# Patient Record
Sex: Female | Born: 1955 | Race: White | Marital: Married | Smoking: Former smoker
Health system: Southern US, Community
[De-identification: ages and names within clinical notes are randomized; demographics above are authoritative.]

## PROBLEM LIST (undated history)

## (undated) DIAGNOSIS — E785 Hyperlipidemia, unspecified: Secondary | ICD-10-CM

## (undated) DIAGNOSIS — I10 Essential (primary) hypertension: Secondary | ICD-10-CM

## (undated) DIAGNOSIS — M109 Gout, unspecified: Secondary | ICD-10-CM

## (undated) DIAGNOSIS — E119 Type 2 diabetes mellitus without complications: Secondary | ICD-10-CM

## (undated) DIAGNOSIS — M8580 Other specified disorders of bone density and structure, unspecified site: Secondary | ICD-10-CM

## (undated) HISTORY — DX: Other specified disorders of bone density and structure, unspecified site: M85.80

## (undated) HISTORY — DX: Type 2 diabetes mellitus without complications: E11.9

## (undated) HISTORY — PX: APPENDECTOMY: SHX54

## (undated) HISTORY — PX: JOINT REPLACEMENT: SHX530

## (undated) HISTORY — PX: ABDOMINAL HYSTERECTOMY: SUR658

## (undated) HISTORY — DX: Hyperlipidemia, unspecified: E78.5

## (undated) HISTORY — PX: ABDOMINAL HYSTERECTOMY: SHX81

## (undated) HISTORY — PX: RECTAL PROLAPSE REPAIR: SHX759

## (undated) HISTORY — DX: Essential (primary) hypertension: I10

## (undated) HISTORY — PX: TONSILLECTOMY: SUR1361

## (undated) HISTORY — PX: BREAST CYST ASPIRATION: SHX578

## (undated) HISTORY — PX: VARICOSE VEIN SURGERY: SHX832

---

## 2003-07-06 LAB — HM COLONOSCOPY

## 2011-07-06 LAB — HM MAMMOGRAPHY: HM Mammogram: NORMAL

## 2013-12-23 HISTORY — DX: Hypercalcemia: E83.52

## 2014-09-24 LAB — LIPID PANEL
Cholesterol: 134 mg/dL (ref 0–200)
HDL: 45 mg/dL (ref 35–70)
LDL CALC: 19 mg/dL
Triglycerides: 348 mg/dL — AB (ref 40–160)

## 2014-09-24 LAB — CBC AND DIFFERENTIAL: Hemoglobin: 14.7 g/dL (ref 12.0–16.0)

## 2014-09-24 LAB — HEMOGLOBIN A1C: Hgb A1c MFr Bld: 6.4 % — AB (ref 4.0–6.0)

## 2014-12-03 ENCOUNTER — Other Ambulatory Visit: Payer: Self-pay | Admitting: Internal Medicine

## 2014-12-03 ENCOUNTER — Encounter: Payer: Self-pay | Admitting: Internal Medicine

## 2014-12-03 DIAGNOSIS — M5116 Intervertebral disc disorders with radiculopathy, lumbar region: Secondary | ICD-10-CM | POA: Insufficient documentation

## 2014-12-03 DIAGNOSIS — K5732 Diverticulitis of large intestine without perforation or abscess without bleeding: Secondary | ICD-10-CM | POA: Insufficient documentation

## 2014-12-03 DIAGNOSIS — N6019 Diffuse cystic mastopathy of unspecified breast: Secondary | ICD-10-CM | POA: Insufficient documentation

## 2014-12-03 DIAGNOSIS — I10 Essential (primary) hypertension: Secondary | ICD-10-CM | POA: Insufficient documentation

## 2014-12-03 DIAGNOSIS — K635 Polyp of colon: Secondary | ICD-10-CM | POA: Insufficient documentation

## 2014-12-03 DIAGNOSIS — E782 Mixed hyperlipidemia: Secondary | ICD-10-CM | POA: Insufficient documentation

## 2014-12-03 DIAGNOSIS — M810 Age-related osteoporosis without current pathological fracture: Secondary | ICD-10-CM | POA: Insufficient documentation

## 2014-12-03 DIAGNOSIS — E785 Hyperlipidemia, unspecified: Secondary | ICD-10-CM

## 2014-12-03 DIAGNOSIS — E1169 Type 2 diabetes mellitus with other specified complication: Secondary | ICD-10-CM | POA: Insufficient documentation

## 2014-12-04 ENCOUNTER — Encounter: Payer: Self-pay | Admitting: Internal Medicine

## 2014-12-04 ENCOUNTER — Other Ambulatory Visit: Payer: Self-pay | Admitting: Internal Medicine

## 2014-12-04 ENCOUNTER — Ambulatory Visit (INDEPENDENT_AMBULATORY_CARE_PROVIDER_SITE_OTHER): Payer: Medicare PPO | Admitting: Internal Medicine

## 2014-12-04 VITALS — BP 142/74 | HR 78 | Temp 98.2°F | Ht 66.0 in | Wt 187.8 lb

## 2014-12-04 DIAGNOSIS — J40 Bronchitis, not specified as acute or chronic: Secondary | ICD-10-CM | POA: Diagnosis not present

## 2014-12-04 DIAGNOSIS — H109 Unspecified conjunctivitis: Secondary | ICD-10-CM

## 2014-12-04 MED ORDER — AMOXICILLIN-POT CLAVULANATE 875-125 MG PO TABS: 1.0000 | ORAL_TABLET | Freq: Two times a day (BID) | ORAL | Status: DC

## 2014-12-04 MED ORDER — NEOMYCIN-POLYMYXIN-DEXAMETH 3.5-10000-0.1 OP SUSP: 2.0000 [drp] | Freq: Four times a day (QID) | OPHTHALMIC | Status: DC

## 2014-12-04 NOTE — Progress Notes (Signed)
Date:  12/04/2014   Name:  Kiara Campbell   DOB:  08-19-1955   MRN:  [id number]  PCP:  No primary care provider on file.    Chief Complaint: Cough; Sinusitis; and Sore Throat   History of Present Illness:  This is a 59 y.o. female who is presenting with sinus congestion, cough and eye irritation.  Sinus pain - onset about one week ago after a cold.  Sinus pressure bilaterally, thick mucus discharge, post nasal drainage and cough.  No wheezes or SOB.  No fever or chills.  Currently using otc flonase spray.  Eye discharge - started three days ago.  Eyes are itchy with thick white mucus in the corners.  No eye pain or change in vision.  No current treatment.  Review of Systems:  Review of Systems  Constitutional: Negative for fever.  HENT: Positive for postnasal drip, sinus pressure and sore throat. Negative for ear pain and nosebleeds.   Eyes: Positive for discharge and itching (thick mucoid discharge). Negative for pain.  Respiratory: Positive for cough (thick white phlegm). Negative for shortness of breath and wheezing.   Gastrointestinal: Negative for nausea and diarrhea.  Neurological: Positive for headaches (frontal headache and pressure). Negative for dizziness.    Patient Active Problem List   Diagnosis Date Noted  . 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 Colon polyp 12/03/2014  . Diverticulitis of colon 12/03/2014  . Essential (primary) hypertension 12/03/2014  . Herniated nucleus pulposus 12/03/2014  . Combined fat and carbohydrate induced hyperlipemia 12/03/2014  . OP (osteoporosis) 12/03/2014  . Compulsive tobacco user syndrome 12/03/2014  . Calcium blood increased 12/23/2013    Prior to Admission medications   Medication Sig Start Date End Date Taking? Authorizing Provider  Calcium Carb-Cholecalciferol (CALCIUM + D3) 600-200 MG-UNIT TABS Take 1 tablet by mouth daily.   Yes Historical Provider, MD  Asencion Islam ALLERGY RELIEF 50 MCG/ACT nasal spray Place 2 sprays into the nose  daily as needed. 11/07/14  Yes Historical Provider, MD  losartan-hydrochlorothiazide (HYZAAR) 50-12.5 MG per tablet Take 1 tablet by mouth daily. 09/24/14  Yes Historical Provider, MD  metoprolol succinate (TOPROL-XL) 50 MG 24 hr tablet Take 1.5 tablets by mouth daily. 09/24/14  Yes Historical Provider, MD  Naproxen Sodium 220 MG CAPS Take 1 capsule by mouth daily as needed.   Yes Historical Provider, MD  simvastatin (ZOCOR) 40 MG tablet Take 1 tablet by mouth at bedtime. 09/24/14  Yes Historical Provider, MD    No Known Allergies  Past Surgical History  Procedure Laterality Date  . Varicose vein surgery    . Tonsillectomy    . Abdominal hysterectomy      partial  . Appendectomy      History  Substance Use Topics  . Smoking status: Current Every Day Smoker -- 0.50 packs/day for 30 years    Types: Cigarettes  . Smokeless tobacco: Not on file  . Alcohol Use: 1.2 oz/week    2 Standard drinks or equivalent per week    Family History  Problem Relation Age of Onset  . Diabetes Maternal Grandmother   . CAD Father     died age 55    Medication list has been reviewed and updated.  Physical Examination:  Physical Exam  Constitutional: She is oriented to person, place, and time. She appears well-developed. No distress.  HENT:  Head: Normocephalic and atraumatic. Head is without right periorbital erythema and without left periorbital erythema.  Right Ear: External ear and ear canal normal.  Left  Ear: External ear and ear canal normal.  Nose: Right sinus exhibits maxillary sinus tenderness and frontal sinus tenderness. Left sinus exhibits maxillary sinus tenderness and frontal sinus tenderness.  Mouth/Throat: Uvula is midline. No oropharyngeal exudate or posterior oropharyngeal edema.  Eyes: Right eye exhibits discharge. Left eye exhibits discharge. No scleral icterus.  Neck: No thyromegaly present.  Cardiovascular: Normal rate, regular rhythm and normal heart sounds.    Pulmonary/Chest: Effort normal. No respiratory distress. She has no wheezes. She has no rales.  Lymphadenopathy:    She has no cervical adenopathy.  Neurological: She is alert and oriented to person, place, and time.  Skin: Skin is warm and dry. No rash noted.  Psychiatric: She has a normal mood and affect. Her behavior is normal. Thought content normal.    BP 164/80 mmHg  Pulse 78  Temp(Src) 98.2 F (36.8 C)  Wt 187 lb 12.8 oz (85.186 kg)  SpO2 97%  LMP 09/27/1995  Assessment and Plan:  1. Bronchitis Continue fluids, otc cough syrup if needed - amoxicillin-clavulanate (AUGMENTIN) 875-125 MG per tablet; Take 1 tablet by mouth 2 (two) times daily.  Dispense: 20 tablet; Refill: 0  2. Bilateral conjunctivitis Avoid contact with others until resolved. - neomycin-polymyxin b-dexamethasone (MAXITROL) 3.5-10000-0.1 SUSP; Place 2 drops into both eyes every 6 (six) hours.  Dispense: 1 Bottle; Refill: 0   Halina Maidens, MD [facility]  12/04/2014

## 2014-12-04 NOTE — Patient Instructions (Signed)

## 2015-01-02 ENCOUNTER — Other Ambulatory Visit: Payer: Self-pay | Admitting: Internal Medicine

## 2015-01-02 ENCOUNTER — Telehealth: Payer: Self-pay

## 2015-01-02 DIAGNOSIS — J40 Bronchitis, not specified as acute or chronic: Secondary | ICD-10-CM

## 2015-01-02 MED ORDER — AMOXICILLIN-POT CLAVULANATE 875-125 MG PO TABS: 1.0000 | ORAL_TABLET | Freq: Two times a day (BID) | ORAL | Status: DC

## 2015-01-02 NOTE — Telephone Encounter (Signed)
Patient has sinus infection again, she wants you to call in another antibiotic.dr

## 2015-03-10 ENCOUNTER — Encounter: Payer: Self-pay | Admitting: Internal Medicine

## 2015-03-10 ENCOUNTER — Ambulatory Visit (INDEPENDENT_AMBULATORY_CARE_PROVIDER_SITE_OTHER): Payer: Medicare PPO | Admitting: Internal Medicine

## 2015-03-10 VITALS — BP 154/70 | HR 80 | Ht 65.0 in | Wt 186.4 lb

## 2015-03-10 DIAGNOSIS — R7309 Other abnormal glucose: Secondary | ICD-10-CM | POA: Diagnosis not present

## 2015-03-10 DIAGNOSIS — E119 Type 2 diabetes mellitus without complications: Secondary | ICD-10-CM | POA: Insufficient documentation

## 2015-03-10 DIAGNOSIS — M722 Plantar fascial fibromatosis: Secondary | ICD-10-CM

## 2015-03-10 DIAGNOSIS — R7303 Prediabetes: Secondary | ICD-10-CM

## 2015-03-10 DIAGNOSIS — Z23 Encounter for immunization: Secondary | ICD-10-CM | POA: Diagnosis not present

## 2015-03-10 DIAGNOSIS — IMO0002 Reserved for concepts with insufficient information to code with codable children: Secondary | ICD-10-CM

## 2015-03-10 DIAGNOSIS — M519 Unspecified thoracic, thoracolumbar and lumbosacral intervertebral disc disorder: Secondary | ICD-10-CM

## 2015-03-10 MED ORDER — GABAPENTIN 100 MG PO CAPS: 100.0000 mg | ORAL_CAPSULE | Freq: Every day | ORAL | Status: DC

## 2015-03-10 NOTE — Progress Notes (Signed)
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 The pain is present in the lumbar spine. The quality of the pain is described as aching and burning. The pain radiates to the right thigh. The pain is moderate (She's had several epidural steroid injections without benefit. Is also up into the pain clinic at [facility] without much benefit. She is very hesitant to consider surgery because they cannot guarantee improvement in her back pain.). Associated symptoms include weakness. Pertinent negatives include no chest pain, headaches, numbness or pelvic pain.  Foot Pain This is a new (She initially had pain in the right heel with only groundglass 5 weeks ago. She took Naprosyn and used ice and elevate it and put in a shoe insert. Symptoms much are much improved at this point but now she has some tenderness and swelling on the lateral as) problem. Associated symptoms include weakness. Pertinent negatives include no chest pain, chills, coughing, diaphoresis, fatigue, headaches, joint swelling, myalgias or numbness.  Diabetes She presents for her follow-up (Patient noted be prediabetic 5 months ago with an A1c of 6.4.) diabetic visit. Disease course: She has been working on decreasing her carbon sweet intake. Her weights been stable. She is not obtained a glucometer. Pertinent negatives for hypoglycemia include no headaches. Associated symptoms include weakness. Pertinent negatives for diabetes include no chest pain and no fatigue.     Review of Systems:  Review of Systems  Constitutional: Negative for chills, diaphoresis and fatigue.  Respiratory: Negative for cough, choking and shortness of breath.   Cardiovascular: Negative for chest pain and leg swelling.  Gastrointestinal: Negative.   Genitourinary: Negative.   Negative for urgency, difficulty urinating and pelvic pain.  Musculoskeletal: Positive for back pain and gait problem. Negative for myalgias and joint swelling.  Neurological: Positive for weakness. Negative for numbness and headaches.    Patient Active Problem List   Diagnosis Date Noted  . Prediabetes 03/10/2015  . 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 Colon polyp 12/03/2014  . Diverticulitis of colon 12/03/2014  . Essential (primary) hypertension 12/03/2014  . Herniated nucleus pulposus 12/03/2014  . Combined fat and carbohydrate induced hyperlipemia 12/03/2014  . OP (osteoporosis) 12/03/2014  . Compulsive tobacco user syndrome 12/03/2014  . Calcium blood increased 12/23/2013    Prior to Admission medications   Medication Sig Start Date End Date Taking? Authorizing Provider  Calcium Carb-Cholecalciferol (CALCIUM + D3) 600-200 MG-UNIT TABS Take 1 tablet by mouth daily.   Yes Historical Provider, MD  fluticasone Asencion Islam) 50 MCG/ACT nasal spray  02/12/15  Yes Historical Provider, MD  losartan-hydrochlorothiazide (HYZAAR) 50-12.5 MG per tablet Take 1 tablet by mouth daily. 09/24/14  Yes Historical Provider, MD  metoprolol succinate (TOPROL-XL) 50 MG 24 hr tablet Take 1.5 tablets by mouth daily. 09/24/14  Yes Historical Provider, MD  Naproxen Sodium 220 MG CAPS Take 1 capsule by mouth daily as needed.   Yes Historical Provider, MD  simvastatin (ZOCOR) 40 MG tablet Take 1 tablet by mouth at bedtime. 09/24/14  Yes Historical Provider, MD  amoxicillin-clavulanate (AUGMENTIN) 875-125 MG per tablet Take 1 tablet by mouth 2 (two) times daily. Patient not taking: Reported on 03/10/2015 01/02/15   Glean Hess, MD  neomycin-polymyxin b-dexamethasone (MAXITROL) 3.5-10000-0.1 SUSP Place 2 drops into both eyes every 6 (six) hours.  Patient not taking: Reported on 03/10/2015 12/04/14   Glean Hess, MD    No Known Allergies  Past Surgical History  Procedure Laterality Date  . Varicose vein surgery    .  Tonsillectomy    . Abdominal hysterectomy      partial  . Appendectomy      Social History  Substance Use Topics  . Smoking status: Current Every Day Smoker -- 0.50 packs/day for 30 years    Types: Cigarettes  . Smokeless tobacco: None  . Alcohol Use: 1.2 oz/week    2 Standard drinks or equivalent per week     Medication list has been reviewed and updated.  Physical Examination:  Physical Exam  Constitutional: She is oriented to person, place, and time. She appears well-developed. She appears distressed.  HENT:  Head: Normocephalic and atraumatic.  Eyes: Conjunctivae are normal. Right eye exhibits no discharge. Left eye exhibits no discharge. No scleral icterus.  Cardiovascular: Normal rate, regular rhythm and normal heart sounds.   Pulses:      Dorsalis pedis pulses are 1+ on the right side, and 1+ on the left side.       Posterior tibial pulses are 1+ on the right side, and 1+ on the left side.  Pulmonary/Chest: Effort normal and breath sounds normal. No respiratory distress. She has no wheezes.  Musculoskeletal: Normal range of motion.       Feet:  Neurological: She is alert and oriented to person, place, and time.  Reflex Scores:      Patellar reflexes are 1+ on the right side and 1+ on the left side. Skin: Skin is warm and dry. No rash noted.  Psychiatric: She has a normal mood and affect. Her behavior is normal. Thought content normal.    BP 154/70 mmHg  Pulse 80  Ht 5\' 5"  (1.651 m)  Wt 186 lb 6.4 oz (84.55 kg)  BMI 31.02 kg/m2  LMP 09/27/1995  Assessment and Plan: 1. Prediabetes Continue dietary modifications - Hemoglobin A1c  2. Herniated nucleus pulposus Will consider surgical consult. Patient is encouraged to do this especially in view of persistent lower extremity symptoms weakness and occasional falls. We'll add Neurontin at bedtime and titrate up as tolerated. Continue naproxen. - gabapentin (NEURONTIN) 100 MG capsule; Take 1-3 capsules (100-300 mg  total) by mouth at bedtime.  Dispense: 270 capsule; Refill: 1  3. Need for influenza vaccination - Flu Vaccine QUAD 36+ mos PF IM (Fluarix & Fluzone Quad PF)  4. Plantar fasciitis of right foot Continue to use naproxen as needed. Continue ice when necessary.   Halina Maidens, MD [facility]  03/10/2015

## 2015-03-11 LAB — HEMOGLOBIN A1C
ESTIMATED AVERAGE GLUCOSE: 140 mg/dL
Hgb A1c MFr Bld: 6.5 % — ABNORMAL HIGH (ref 4.8–5.6)

## 2015-03-12 ENCOUNTER — Other Ambulatory Visit: Payer: Self-pay | Admitting: Internal Medicine

## 2015-03-12 MED ORDER — ACCU-CHEK SOFTCLIX LANCET DEV MISC: 1.0000 | Freq: Every day | Status: DC

## 2015-03-12 MED ORDER — METFORMIN HCL ER (MOD) 500 MG PO TB24: 500.0000 mg | ORAL_TABLET | Freq: Every day | ORAL | Status: DC

## 2015-03-15 ENCOUNTER — Other Ambulatory Visit: Payer: Self-pay | Admitting: Internal Medicine

## 2015-03-17 ENCOUNTER — Other Ambulatory Visit: Payer: Self-pay | Admitting: Internal Medicine

## 2015-03-17 MED ORDER — ACCU-CHEK SOFTCLIX LANCET DEV MISC: Status: DC

## 2015-04-01 ENCOUNTER — Other Ambulatory Visit: Payer: Self-pay | Admitting: Internal Medicine

## 2015-04-01 MED ORDER — METFORMIN HCL ER (MOD) 500 MG PO TB24: 500.0000 mg | ORAL_TABLET | Freq: Every day | ORAL | Status: DC

## 2015-04-03 ENCOUNTER — Other Ambulatory Visit: Payer: Self-pay | Admitting: Internal Medicine

## 2015-04-03 MED ORDER — METFORMIN HCL ER (MOD) 500 MG PO TB24: 500.0000 mg | ORAL_TABLET | Freq: Every day | ORAL | Status: DC

## 2015-04-07 ENCOUNTER — Other Ambulatory Visit: Payer: Self-pay | Admitting: Internal Medicine

## 2015-04-07 MED ORDER — METFORMIN HCL ER (MOD) 500 MG PO TB24: 500.0000 mg | ORAL_TABLET | Freq: Every day | ORAL | Status: DC

## 2015-04-13 ENCOUNTER — Other Ambulatory Visit: Payer: Self-pay | Admitting: Internal Medicine

## 2015-04-13 MED ORDER — METFORMIN HCL ER (MOD) 500 MG PO TB24: 500.0000 mg | ORAL_TABLET | Freq: Every day | ORAL | Status: DC

## 2015-06-02 ENCOUNTER — Encounter: Payer: Self-pay | Admitting: Internal Medicine

## 2015-06-02 ENCOUNTER — Ambulatory Visit (INDEPENDENT_AMBULATORY_CARE_PROVIDER_SITE_OTHER): Payer: Medicare PPO | Admitting: Internal Medicine

## 2015-06-02 ENCOUNTER — Other Ambulatory Visit: Payer: Self-pay | Admitting: Internal Medicine

## 2015-06-02 VITALS — BP 140/66 | HR 84 | Ht 65.0 in | Wt 185.2 lb

## 2015-06-02 DIAGNOSIS — E119 Type 2 diabetes mellitus without complications: Secondary | ICD-10-CM

## 2015-06-02 DIAGNOSIS — L0100 Impetigo, unspecified: Secondary | ICD-10-CM | POA: Diagnosis not present

## 2015-06-02 DIAGNOSIS — E118 Type 2 diabetes mellitus with unspecified complications: Secondary | ICD-10-CM | POA: Insufficient documentation

## 2015-06-02 MED ORDER — MUPIROCIN 2 % EX OINT: 1.0000 "application " | TOPICAL_OINTMENT | Freq: Two times a day (BID) | CUTANEOUS | Status: DC

## 2015-06-02 MED ORDER — GLIMEPIRIDE 2 MG PO TABS: 2.0000 mg | ORAL_TABLET | Freq: Every day | ORAL | Status: DC

## 2015-06-02 MED ORDER — DICLOXACILLIN SODIUM 500 MG PO CAPS: 500.0000 mg | ORAL_CAPSULE | Freq: Four times a day (QID) | ORAL | Status: DC

## 2015-06-02 NOTE — Progress Notes (Signed)
Date:  06/02/2015   Name:  Kiara Campbell   DOB:  Aug 08, 1955   MRN:  [id number]   Chief Complaint: Rash  Several months ago patient began taking metformin for elevated blood sugars. After about a week she developed a rash and swelling around her lips. She stopped the medication and it went away pretty quickly. She waited several weeks and then begin the metformin again after 2 weeks she began to have similar symptoms with redness swelling and pain around her lips and chin. She continued the metformin until about one week ago when the symptoms became so severe that she stopped it. She's been applying Vaseline to the rash. She describes it as painful not pruritic. It's been weepy with a yellow crust especially prominent in the morning. She denies any swelling of her mouth or tongue, no swelling of her eyes, no fever, and no shortness of breath.   Review of Systems  Constitutional: Negative for fever, chills and fatigue.  HENT: Negative for dental problem, ear discharge, mouth sores, sore throat and trouble swallowing.   Respiratory: Negative for cough, shortness of breath and wheezing.   Cardiovascular: Negative for chest pain, palpitations and leg swelling.  Skin: Positive for color change and rash.    Patient Active Problem List   Diagnosis Date Noted  . Controlled diabetes mellitus type II without complication ([place]) 99991111  . 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 Colon polyp 12/03/2014  . Diverticulitis of colon 12/03/2014  . Essential (primary) hypertension 12/03/2014  . Herniated nucleus pulposus 12/03/2014  . Combined fat and carbohydrate induced hyperlipemia 12/03/2014  . OP (osteoporosis) 12/03/2014  . Compulsive tobacco user syndrome 12/03/2014  . Calcium blood increased 12/23/2013    Prior to Admission medications   Medication Sig Start Date End Date Taking? Authorizing Provider  ACCU-CHEK SOFTCLIX LANCETS lancets  05/12/15  Yes Historical Provider, MD  Calcium  Carb-Cholecalciferol (CALCIUM + D3) 600-200 MG-UNIT TABS Take 1 tablet by mouth daily.   Yes Historical Provider, MD  fluticasone Asencion Islam) 50 MCG/ACT nasal spray  02/12/15  Yes Historical Provider, MD  gabapentin (NEURONTIN) 100 MG capsule Take 1-3 capsules (100-300 mg total) by mouth at bedtime. 03/10/15  Yes Glean Hess, MD  Lancet Devices [facility]) lancets 1 each by Other route daily. Use as instructed 03/12/15  Yes Glean Hess, MD  Lancet Devices [facility]) lancets Use daily to check blood sugar 03/17/15  Yes Glean Hess, MD  losartan-hydrochlorothiazide [facility]) 50-12.5 MG per tablet Take 1 tablet by mouth daily. 09/24/14  Yes Historical Provider, MD  metoprolol succinate (TOPROL-XL) 100 MG 24 hr tablet TAKE 1 AND 1/2 TABLET DAILY 03/15/15  Yes Glean Hess, MD  metoprolol succinate (TOPROL-XL) 50 MG 24 hr tablet Take 1.5 tablets by mouth daily. 09/24/14  Yes Historical Provider, MD  Naproxen Sodium 220 MG CAPS Take 1 capsule by mouth daily as needed.   Yes Historical Provider, MD  simvastatin (ZOCOR) 40 MG tablet Take 1 tablet by mouth at bedtime. 09/24/14  Yes Historical Provider, MD  metFORMIN (GLUMETZA) 500 MG (MOD) 24 hr tablet Take 1 tablet (500 mg total) by mouth daily. Patient not taking: Reported on 06/02/2015 04/13/15   Glean Hess, MD  neomycin-polymyxin b-dexamethasone (MAXITROL) 3.5-10000-0.1 SUSP Place 2 drops into both eyes every 6 (six) hours. Patient not taking: Reported on 03/10/2015 12/04/14   Glean Hess, MD    No Known Allergies  Past Surgical History  Procedure Laterality Date  . Varicose vein  surgery    . Tonsillectomy    . Abdominal hysterectomy      partial  . Appendectomy      Social History  Substance Use Topics  . Smoking status: Current Every Day Smoker -- 0.50 packs/day for 30 years    Types: Cigarettes  . Smokeless tobacco: None  . Alcohol Use: 1.2 oz/week    2 Standard drinks or equivalent per week     Medication list has been reviewed and updated.   Physical Exam  Constitutional: She appears well-developed and well-nourished.  Neck: Normal range of motion. Neck supple.  Cardiovascular: Normal rate, regular rhythm and normal heart sounds.   Pulmonary/Chest: Effort normal and breath sounds normal.  Musculoskeletal: She exhibits no edema.  Lymphadenopathy:    She has no cervical adenopathy.  Skin: Rash noted. There is erythema (with weeping and crust formation around the entire mouth and chin).  Psychiatric: She has a normal mood and affect. Her behavior is normal. Thought content normal.    BP 140/66 mmHg  Pulse 84  Ht 5\' 5"  (1.651 m)  Wt 185 lb 3.2 oz (84.006 kg)  BMI 30.82 kg/m2  LMP 09/27/1995  Assessment and Plan: 1. Impetigo - dicloxacillin (DYNAPEN) 500 MG capsule; Take 1 capsule (500 mg total) by mouth 4 (four) times daily.  Dispense: 40 capsule; Refill: 0 - mupirocin ointment (BACTROBAN) 2 %; Place 1 application into the nose 2 (two) times daily.  Dispense: 22 g; Refill: 0  2. Controlled type 2 diabetes mellitus without complication, without long-term current use of insulin (HCC) Remain off metformin Prescribed low-dose Amaryl - glimepiride (AMARYL) 2 MG tablet; Take 1 tablet (2 mg total) by mouth daily before breakfast.  Dispense: 30 tablet; Refill: 0   Halina Maidens, MD [facility]  06/02/2015

## 2015-07-24 ENCOUNTER — Encounter: Payer: Self-pay | Admitting: Internal Medicine

## 2015-07-24 ENCOUNTER — Ambulatory Visit (INDEPENDENT_AMBULATORY_CARE_PROVIDER_SITE_OTHER): Payer: Medicare PPO | Admitting: Internal Medicine

## 2015-07-24 VITALS — BP 140/80 | HR 68 | Temp 98.3°F | Resp 16 | Ht 66.0 in | Wt 186.0 lb

## 2015-07-24 DIAGNOSIS — E119 Type 2 diabetes mellitus without complications: Secondary | ICD-10-CM

## 2015-07-24 DIAGNOSIS — L0100 Impetigo, unspecified: Secondary | ICD-10-CM

## 2015-07-24 DIAGNOSIS — J0140 Acute pansinusitis, unspecified: Secondary | ICD-10-CM | POA: Diagnosis not present

## 2015-07-24 MED ORDER — CEFDINIR 300 MG PO CAPS: 300.0000 mg | ORAL_CAPSULE | Freq: Two times a day (BID) | ORAL | Status: DC

## 2015-07-24 MED ORDER — ALBUTEROL SULFATE HFA 108 (90 BASE) MCG/ACT IN AERS: 2.0000 | INHALATION_SPRAY | Freq: Four times a day (QID) | RESPIRATORY_TRACT | Status: DC | PRN

## 2015-07-24 NOTE — Progress Notes (Signed)
Date:  07/24/2015   Name:  Kiara Campbell   DOB:  [birth year]   MRN:  [id number]   Chief Complaint: Sinusitis Sinusitis This is a new problem. The current episode started in the past 7 days. There has been no fever. Associated symptoms include congestion, coughing, headaches, sinus pressure and a sore throat. Pertinent negatives include no chills, diaphoresis, ear pain or shortness of breath. Past treatments include antibiotics and spray decongestants. The treatment provided mild relief.  Rash This is a recurrent problem. The affected locations include the lips. The rash is characterized by blistering, dryness, redness and peeling. Associated with: stopped metformin due to possible cause. Associated symptoms include congestion, coughing, fatigue and a sore throat. Pertinent negatives include no diarrhea, fever, shortness of breath or vomiting. Past treatments include antibiotic cream and antibiotics (responded well to docloxacillin and mupirocin). The treatment provided significant (but it recurs) relief.      Review of Systems  Constitutional: Positive for fatigue. Negative for fever, chills and diaphoresis.  HENT: Positive for congestion, sinus pressure and sore throat. Negative for ear pain.   Respiratory: Positive for cough and wheezing. Negative for chest tightness and shortness of breath.   Cardiovascular: Negative for chest pain and palpitations.  Gastrointestinal: Negative for vomiting and diarrhea.  Skin: Positive for rash.  Neurological: Positive for headaches. Negative for dizziness, syncope and numbness.    Patient Active Problem List   Diagnosis Date Noted  . Controlled type 2 diabetes mellitus without complication, without long-term current use of insulin ([place]) 06/02/2015  . 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 Colon polyp 12/03/2014  . Diverticulitis of colon 12/03/2014  . Essential (primary) hypertension 12/03/2014  . Herniated nucleus pulposus 12/03/2014  . Combined fat  and carbohydrate induced hyperlipemia 12/03/2014  . OP (osteoporosis) 12/03/2014  . Compulsive tobacco user syndrome 12/03/2014  . Calcium blood increased 12/23/2013    Prior to Admission medications   Medication Sig Start Date End Date Taking? Authorizing Provider  ACCU-CHEK SOFTCLIX LANCETS lancets  05/12/15  Yes Historical Provider, MD  Calcium Carb-Cholecalciferol (CALCIUM + D3) 600-200 MG-UNIT TABS Take 1 tablet by mouth daily.   Yes Historical Provider, MD  fluticasone Asencion Islam) 50 MCG/ACT nasal spray  02/12/15  Yes Historical Provider, MD  gabapentin (NEURONTIN) 100 MG capsule Take 1-3 capsules (100-300 mg total) by mouth at bedtime. 03/10/15  Yes Glean Hess, MD  glimepiride (AMARYL) 2 MG tablet Take 1 tablet (2 mg total) by mouth daily before breakfast. 06/02/15  Yes Glean Hess, MD  Lancet Devices [facility]) lancets 1 each by Other route daily. Use as instructed 03/12/15  Yes Glean Hess, MD  Lancet Devices [facility]) lancets Use daily to check blood sugar 03/17/15  Yes Glean Hess, MD  losartan-hydrochlorothiazide [facility]) 50-12.5 MG per tablet Take 1 tablet by mouth daily. 09/24/14  Yes Historical Provider, MD  metoprolol succinate (TOPROL-XL) 100 MG 24 hr tablet TAKE 1 AND 1/2 TABLET DAILY 03/15/15  Yes Glean Hess, MD  metoprolol succinate (TOPROL-XL) 50 MG 24 hr tablet Take 1.5 tablets by mouth daily. 09/24/14  Yes Historical Provider, MD  mupirocin ointment (BACTROBAN) 2 % Place 1 application into the nose 2 (two) times daily. 06/02/15  Yes Glean Hess, MD  Naproxen Sodium 220 MG CAPS Take 1 capsule by mouth daily as needed.   Yes Historical Provider, MD  simvastatin (ZOCOR) 40 MG tablet Take 1 tablet by mouth at bedtime. 09/24/14  Yes Historical Provider, MD  metFORMIN (GLUMETZA) 500 MG (MOD) 24 hr tablet Take 1 tablet (500 mg total) by mouth daily. Patient not taking: Reported on 06/02/2015 04/13/15   Glean Hess, MD    neomycin-polymyxin b-dexamethasone (MAXITROL) 3.5-10000-0.1 SUSP Place 2 drops into both eyes every 6 (six) hours. Patient not taking: Reported on 03/10/2015 12/04/14   Glean Hess, MD    No Known Allergies  Past Surgical History  Procedure Laterality Date  . Varicose vein surgery    . Tonsillectomy    . Abdominal hysterectomy      partial  . Appendectomy      Social History  Substance Use Topics  . Smoking status: Current Every Day Smoker -- 0.50 packs/day for 30 years    Types: Cigarettes  . Smokeless tobacco: None  . Alcohol Use: 1.2 oz/week    2 Standard drinks or equivalent per week     Medication list has been reviewed and updated.   Physical Exam  Constitutional: She is oriented to person, place, and time. She appears well-developed and well-nourished.  HENT:  Right Ear: External ear and ear canal normal. Tympanic membrane is not erythematous and not retracted.  Left Ear: External ear and ear canal normal. Tympanic membrane is not erythematous and not retracted.  Nose: Right sinus exhibits maxillary sinus tenderness and frontal sinus tenderness. Left sinus exhibits maxillary sinus tenderness and frontal sinus tenderness.  Mouth/Throat: Uvula is midline and mucous membranes are normal. No oral lesions. Posterior oropharyngeal erythema present. No oropharyngeal exudate.  Cardiovascular: Normal rate, regular rhythm and normal heart sounds.   Pulmonary/Chest: Effort normal and breath sounds normal. She has no wheezes. She has no rales.  Lymphadenopathy:    She has no cervical adenopathy.  Neurological: She is alert and oriented to person, place, and time.  Skin:  Skin around upper lip - slightly red and peeling  Nursing note and vitals reviewed.   BP 140/80 mmHg  Pulse 68  Temp(Src) 98.3 F (36.8 C)  Resp 16  Ht 5\' 6"  (1.676 m)  Wt 186 lb (84.369 kg)  BMI 30.04 kg/m2  LMP 09/27/1995  Assessment and Plan: 1. Acute pansinusitis, recurrence not specified -  cefdinir (OMNICEF) 300 MG capsule; Take 1 capsule (300 mg total) by mouth 2 (two) times daily.  Dispense: 20 capsule; Refill: 0 - albuterol (PROVENTIL HFA;VENTOLIN HFA) 108 (90 Base) MCG/ACT inhaler; Inhale 2 puffs into the lungs every 6 (six) hours as needed for wheezing or shortness of breath.  Dispense: 1 Inhaler; Refill: 0  2. Impetigo Continue mupirocin as needed Consider Dermatology consult  3. Controlled type 2 diabetes mellitus without complication, without long-term current use of insulin ([place]) Now on glimepiride Check labs at next visit in March   Halina Maidens, MD [facility]  07/24/2015

## 2015-07-29 ENCOUNTER — Other Ambulatory Visit: Payer: Self-pay | Admitting: Internal Medicine

## 2015-07-29 MED ORDER — TRUE METRIX AIR GLUCOSE METER DEVI: 1.0000 | Freq: Every day | Status: DC

## 2015-07-29 MED ORDER — GLUCOSE BLOOD VI STRP: 1.0000 | ORAL_STRIP | Freq: Every day | Status: DC

## 2015-07-29 MED ORDER — BD SWAB SINGLE USE REGULAR PADS: 1.0000 | MEDICATED_PAD | Freq: Every day | Status: DC

## 2015-07-29 MED ORDER — TRUEPLUS LANCETS 33G MISC: 1.0000 | Freq: Every day | Status: DC

## 2015-09-16 ENCOUNTER — Ambulatory Visit (INDEPENDENT_AMBULATORY_CARE_PROVIDER_SITE_OTHER): Payer: Medicare PPO | Admitting: Family Medicine

## 2015-09-16 ENCOUNTER — Encounter: Payer: Self-pay | Admitting: Family Medicine

## 2015-09-16 VITALS — BP 140/86 | HR 72 | Temp 98.2°F | Resp 12 | Ht 66.0 in | Wt 185.6 lb

## 2015-09-16 DIAGNOSIS — N3000 Acute cystitis without hematuria: Secondary | ICD-10-CM | POA: Diagnosis not present

## 2015-09-16 DIAGNOSIS — R110 Nausea: Secondary | ICD-10-CM

## 2015-09-16 LAB — POCT URINALYSIS DIPSTICK
Bilirubin, UA: NEGATIVE
Glucose, UA: NEGATIVE
Ketones, UA: NEGATIVE
NITRITE UA: NEGATIVE
PH UA: 6
PROTEIN UA: 30
Spec Grav, UA: 1.02
UROBILINOGEN UA: 0.2

## 2015-09-16 MED ORDER — SULFAMETHOXAZOLE-TRIMETHOPRIM 800-160 MG PO TABS: 1.0000 | ORAL_TABLET | Freq: Two times a day (BID) | ORAL | Status: DC

## 2015-09-16 MED ORDER — PROMETHAZINE HCL 25 MG PO TABS: 25.0000 mg | ORAL_TABLET | Freq: Three times a day (TID) | ORAL | Status: DC | PRN

## 2015-09-16 NOTE — Progress Notes (Signed)
Name: Kiara Campbell   MRN: [id number]    DOB: 1956/03/28   Date:09/16/2015       Progress Note  Subjective  Chief Complaint  Chief Complaint  Patient presents with  . Urinary Tract Infection    Patient complains of burning with urination, frequency, nausea. Patient states that symptoms started on Saturday and have been worsening.     Urinary Tract Infection  This is a new problem. The current episode started in the past 7 days. The problem occurs intermittently. The problem has been gradually worsening. The quality of the pain is described as burning. There has been no fever. Associated symptoms include chills, frequency, nausea and urgency. Pertinent negatives include no discharge, flank pain, hematuria or vomiting. She has tried increased fluids for the symptoms. The treatment provided no relief. There is no history of recurrent UTIs.    No problem-specific assessment & plan notes found for this encounter.   History reviewed. No pertinent past medical history.  Past Surgical History  Procedure Laterality Date  . Varicose vein surgery    . Tonsillectomy    . Abdominal hysterectomy      partial  . Appendectomy      Family History  Problem Relation Age of Onset  . Diabetes Maternal Grandmother   . CAD Father     died age 6    Social History   Social History  . Marital Status: Married    Spouse Name: N/A  . Number of Children: N/A  . Years of Education: N/A   Occupational History  . Not on file.   Social History Main Topics  . Smoking status: Current Every Day Smoker -- 0.50 packs/day for 30 years    Types: Cigarettes  . Smokeless tobacco: Not on file  . Alcohol Use: 1.2 oz/week    2 Standard drinks or equivalent per week     Comment: occasional  . Drug Use: No  . Sexual Activity: Not on file   Other Topics Concern  . Not on file   Social History Narrative    No Known Allergies   Review of Systems  Constitutional: Positive for chills. Negative for  fever, weight loss and malaise/fatigue.  HENT: Negative for ear discharge, ear pain and sore throat.   Eyes: Negative for blurred vision.  Respiratory: Negative for cough, sputum production, shortness of breath and wheezing.   Cardiovascular: Negative for chest pain, palpitations and leg swelling.  Gastrointestinal: Positive for nausea. Negative for heartburn, vomiting, abdominal pain, diarrhea, constipation, blood in stool and melena.  Genitourinary: Positive for urgency and frequency. Negative for dysuria, hematuria and flank pain.  Musculoskeletal: Positive for back pain. Negative for myalgias, joint pain and neck pain.  Skin: Negative for rash.  Neurological: Negative for dizziness, tingling, sensory change, focal weakness and headaches.  Endo/Heme/Allergies: Negative for environmental allergies and polydipsia. Does not bruise/bleed easily.  Psychiatric/Behavioral: Negative for depression and suicidal ideas. The patient is not nervous/anxious and does not have insomnia.      Objective  Filed Vitals:   09/16/15 1008  BP: 140/86  Pulse: 72  Temp: 98.2 F (36.8 C)  Resp: 12  Height: 5\' 6"  (1.676 m)  Weight: 185 lb 9.6 oz (84.188 kg)  SpO2: 99%    Physical Exam  Constitutional: She is well-developed, well-nourished, and in no distress. No distress.  HENT:  Head: Normocephalic and atraumatic.  Right Ear: External ear normal.  Left Ear: External ear normal.  Nose: Nose normal.  Mouth/Throat: Oropharynx  is clear and moist.  Eyes: Conjunctivae and EOM are normal. Pupils are equal, round, and reactive to light. Right eye exhibits no discharge. Left eye exhibits no discharge.  Neck: Normal range of motion. Neck supple. No JVD present. No thyromegaly present.  Cardiovascular: Normal rate, regular rhythm, normal heart sounds and intact distal pulses.  Exam reveals no gallop and no friction rub.   No murmur heard. Pulmonary/Chest: Effort normal and breath sounds normal.  Abdominal:  Soft. Bowel sounds are normal. She exhibits no mass. There is tenderness in the suprapubic area. There is no guarding and no CVA tenderness.  Musculoskeletal: Normal range of motion. She exhibits no edema.  Lymphadenopathy:    She has no cervical adenopathy.  Neurological: She is alert. She has normal reflexes.  Skin: Skin is warm and dry. She is not diaphoretic.  Psychiatric: Mood and affect normal.  Nursing note and vitals reviewed.     Assessment & Plan  Problem List Items Addressed This Visit    None    Visit Diagnoses    Acute cystitis without hematuria    -  Primary    Relevant Medications    sulfamethoxazole-trimethoprim (BACTRIM DS,SEPTRA DS) 800-160 MG tablet    Other Relevant Orders    POCT urinalysis dipstick    Nausea        Relevant Medications    promethazine (PHENERGAN) 25 MG tablet         Dr. Geffrey Michaelsen [facility]  09/16/2015

## 2015-09-21 ENCOUNTER — Other Ambulatory Visit: Payer: Self-pay | Admitting: Internal Medicine

## 2015-09-21 MED ORDER — ACCU-CHEK SOFTCLIX LANCETS MISC: Status: DC

## 2015-09-21 MED ORDER — ACCU-CHEK SOFTCLIX LANCET DEV KIT: 1.0000 | PACK | Freq: Every day | Status: DC

## 2015-09-28 ENCOUNTER — Encounter: Payer: Self-pay | Admitting: Internal Medicine

## 2015-09-28 ENCOUNTER — Ambulatory Visit (INDEPENDENT_AMBULATORY_CARE_PROVIDER_SITE_OTHER): Payer: Medicare PPO | Admitting: Internal Medicine

## 2015-09-28 VITALS — BP 138/72 | HR 88 | Ht 66.0 in | Wt 182.0 lb

## 2015-09-28 DIAGNOSIS — M519 Unspecified thoracic, thoracolumbar and lumbosacral intervertebral disc disorder: Secondary | ICD-10-CM | POA: Diagnosis not present

## 2015-09-28 DIAGNOSIS — I10 Essential (primary) hypertension: Secondary | ICD-10-CM | POA: Diagnosis not present

## 2015-09-28 DIAGNOSIS — E1169 Type 2 diabetes mellitus with other specified complication: Secondary | ICD-10-CM | POA: Diagnosis not present

## 2015-09-28 DIAGNOSIS — E785 Hyperlipidemia, unspecified: Secondary | ICD-10-CM | POA: Diagnosis not present

## 2015-09-28 DIAGNOSIS — IMO0002 Reserved for concepts with insufficient information to code with codable children: Secondary | ICD-10-CM

## 2015-09-28 DIAGNOSIS — E119 Type 2 diabetes mellitus without complications: Secondary | ICD-10-CM | POA: Diagnosis not present

## 2015-09-28 MED ORDER — GABAPENTIN 100 MG PO CAPS: 100.0000 mg | ORAL_CAPSULE | Freq: Every day | ORAL | Status: DC

## 2015-09-28 MED ORDER — METFORMIN HCL ER 500 MG PO TB24: 500.0000 mg | ORAL_TABLET | Freq: Every day | ORAL | Status: DC

## 2015-09-28 MED ORDER — SIMVASTATIN 40 MG PO TABS: 40.0000 mg | ORAL_TABLET | Freq: Every day | ORAL | Status: DC

## 2015-09-28 MED ORDER — METOPROLOL SUCCINATE ER 100 MG PO TB24: ORAL_TABLET | ORAL | Status: DC

## 2015-09-28 MED ORDER — LOSARTAN POTASSIUM-HCTZ 50-12.5 MG PO TABS: 1.0000 | ORAL_TABLET | Freq: Every day | ORAL | Status: DC

## 2015-09-28 NOTE — Progress Notes (Signed)
Date:  09/28/2015   Name:  Kiara Campbell   DOB:  04-Oct-1955   MRN:  [id number]   Chief Complaint: Follow-up; Hypertension; and Diabetes Hypertension This is a chronic problem. The current episode started more than 1 year ago. The problem is unchanged. The problem is controlled. Pertinent negatives include no chest pain, headaches, palpitations or shortness of breath. Past treatments include beta blockers, angiotensin blockers and diuretics. There are no compliance problems.   Diabetes She has type 2 diabetes mellitus. Her disease course has been stable. There are no hypoglycemic associated symptoms. Pertinent negatives for hypoglycemia include no headaches or tremors. Pertinent negatives for diabetes include no chest pain, no fatigue, no polydipsia and no polyuria. Current diabetic treatment includes oral agent (monotherapy) (did not tolerate glipizide). She is compliant with treatment most of the time. She is following a generally healthy diet. She monitors blood glucose at home 1-2 x per day. Her breakfast blood glucose is taken between 7-8 am. Her breakfast blood glucose range is generally 130-140 mg/dl. An ACE inhibitor/angiotensin II receptor blocker is being taken. Eye exam is not current.  Hyperlipidemia This is a chronic problem. The problem is controlled. Recent lipid tests were reviewed and are normal. Pertinent negatives include no chest pain or shortness of breath. Current antihyperlipidemic treatment includes statins.  Back Pain This is a chronic problem. The pain is present in the lumbar spine. Pertinent negatives include no abdominal pain, chest pain, dysuria, fever, headaches or numbness.      Review of Systems  Constitutional: Negative for fever, appetite change, fatigue and unexpected weight change.  HENT: Negative for tinnitus and trouble swallowing.   Eyes: Negative for visual disturbance.  Respiratory: Negative for cough, chest tightness and shortness of breath.     Cardiovascular: Negative for chest pain, palpitations and leg swelling.  Gastrointestinal: Positive for nausea. Negative for vomiting and abdominal pain.  Endocrine: Negative for polydipsia and polyuria.  Genitourinary: Negative for dysuria and hematuria.  Musculoskeletal: Positive for back pain. Negative for arthralgias.  Neurological: Negative for tremors, numbness and headaches.  Psychiatric/Behavioral: Negative for dysphoric mood.    Patient Active Problem List   Diagnosis Date Noted  . Impetigo 07/24/2015  . Controlled type 2 diabetes mellitus without complication, without long-term current use of insulin ([place]) 06/02/2015  . 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 Colon polyp 12/03/2014  . Diverticulitis of colon 12/03/2014  . Essential (primary) hypertension 12/03/2014  . Herniated nucleus pulposus 12/03/2014  . OP (osteoporosis) 12/03/2014  . Hyperlipidemia associated with type 2 diabetes mellitus ([place]) 12/03/2014  . Calcium blood increased 12/23/2013    Prior to Admission medications   Medication Sig Start Date End Date Taking? Authorizing Provider  ACCU-CHEK SOFTCLIX LANCETS lancets Use as instructed 09/21/15  Yes Glean Hess, MD  Alcohol Swabs (B-D SINGLE USE SWABS REGULAR) PADS 1 each by Does not apply route daily. 07/29/15  Yes Glean Hess, MD  Blood Glucose Monitoring Suppl (TRUE METRIX AIR GLUCOSE METER) DEVI 1 each by Does not apply route daily. 07/29/15  Yes Glean Hess, MD  Calcium Carb-Cholecalciferol (CALCIUM + D3) 600-200 MG-UNIT TABS Take 1 tablet by mouth daily.   Yes Historical Provider, MD  fluticasone Asencion Islam) 50 MCG/ACT nasal spray  02/12/15  Yes Historical Provider, MD  gabapentin (NEURONTIN) 100 MG capsule Take 1-3 capsules (100-300 mg total) by mouth at bedtime. 03/10/15  Yes Glean Hess, MD  glimepiride (AMARYL) 2 MG tablet Take 1 tablet (2 mg total) by  mouth daily before breakfast. 06/02/15  Yes Reubin Milan, MD  glucose blood (TRUE METRIX  BLOOD GLUCOSE TEST) test strip 1 each by Other route daily. 07/29/15  Yes Reubin Milan, MD  Lancet Devices [facility]) lancets 1 each by Other route daily. Use as instructed 03/12/15  Yes Reubin Milan, MD  Lancets Misc. (ACCU-CHEK SOFTCLIX LANCET DEV) KIT 1 each by Does not apply route daily. 09/21/15  Yes Reubin Milan, MD  losartan-hydrochlorothiazide (HYZAAR) 50-12.5 MG per tablet Take 1 tablet by mouth daily. 09/24/14  Yes Historical Provider, MD  metoprolol succinate (TOPROL-XL) 100 MG 24 hr tablet TAKE 1 AND 1/2 TABLET DAILY 03/15/15  Yes Reubin Milan, MD  Naproxen Sodium 220 MG CAPS Take 1 capsule by mouth daily as needed.   Yes Historical Provider, MD  simvastatin (ZOCOR) 40 MG tablet Take 1 tablet by mouth at bedtime. 09/24/14  Yes Historical Provider, MD  sulfamethoxazole-trimethoprim (BACTRIM DS,SEPTRA DS) 800-160 MG tablet Take 1 tablet by mouth 2 (two) times daily. 09/16/15  Yes Duanne Limerick, MD    No Known Allergies  Past Surgical History  Procedure Laterality Date  . Varicose vein surgery    . Tonsillectomy    . Abdominal hysterectomy      partial  . Appendectomy      Social History  Substance Use Topics  . Smoking status: Current Every Day Smoker -- 0.50 packs/day for 30 years    Types: Cigarettes  . Smokeless tobacco: None  . Alcohol Use: 1.2 oz/week    2 Standard drinks or equivalent per week     Comment: occasional     Medication list has been reviewed and updated.   Physical Exam  Constitutional: She is oriented to person, place, and time. She appears well-developed. No distress.  HENT:  Head: Normocephalic and atraumatic.  Cardiovascular: Normal rate, regular rhythm and normal heart sounds.   Pulmonary/Chest: Effort normal. No respiratory distress. She has decreased breath sounds. She has no wheezes. She has no rhonchi.  Musculoskeletal: Normal range of motion.  Neurological: She is alert and oriented to person, place, and time.  Skin:  Skin is warm and dry. Rash noted. Rash is macular (warty lesion on left calf).  Psychiatric: She has a normal mood and affect. Her speech is normal and behavior is normal. Thought content normal.    BP 138/72 mmHg  Pulse 88  Ht 5\' 6"  (1.676 m)  Wt 182 lb (82.555 kg)  BMI 29.39 kg/m2  LMP 09/27/1995  Assessment and Plan: 1. Essential (primary) hypertension controlled - losartan-hydrochlorothiazide (HYZAAR) 50-12.5 MG tablet; Take 1 tablet by mouth daily.  Dispense: 90 tablet; Refill: 3 - metoprolol succinate (TOPROL-XL) 100 MG 24 hr tablet; TAKE 1 AND 1/2 TABLET DAILY  Dispense: 135 tablet; Refill: 3 - CBC with Differential/Platelet  2. Controlled type 2 diabetes mellitus without complication, without long-term current use of insulin (HCC) D/C glipizide and resume metformin - metFORMIN (GLUCOPHAGE-XR) 500 MG 24 hr tablet; Take 1 tablet (500 mg total) by mouth daily with breakfast.  Dispense: 90 tablet; Refill: 3 - Comprehensive metabolic panel - Hemoglobin A1c - TSH  3. Hyperlipidemia associated with type 2 diabetes mellitus (HCC) On statin therapy - simvastatin (ZOCOR) 40 MG tablet; Take 1 tablet (40 mg total) by mouth at bedtime.  Dispense: 90 tablet; Refill: 3  4. Herniated nucleus pulposus - gabapentin (NEURONTIN) 100 MG capsule; Take 1-3 capsules (100-300 mg total) by mouth at bedtime.  Dispense: 270 capsule; Refill:  1  -Pt encouraged to schedule Mammogram and Eye exam  Halina Maidens, MD [facility]  09/28/2015

## 2015-09-29 LAB — CBC WITH DIFFERENTIAL/PLATELET
BASOS ABS: 0 10*3/uL (ref 0.0–0.2)
BASOS: 0 %
EOS (ABSOLUTE): 0 10*3/uL (ref 0.0–0.4)
Eos: 0 %
Hematocrit: 40.5 % (ref 34.0–46.6)
Hemoglobin: 13.7 g/dL (ref 11.1–15.9)
IMMATURE GRANS (ABS): 0 10*3/uL (ref 0.0–0.1)
IMMATURE GRANULOCYTES: 0 %
LYMPHS: 38 %
Lymphocytes Absolute: 3 10*3/uL (ref 0.7–3.1)
MCH: 31.6 pg (ref 26.6–33.0)
MCHC: 33.8 g/dL (ref 31.5–35.7)
MCV: 93 fL (ref 79–97)
MONOCYTES: 26 %
Monocytes Absolute: 2 10*3/uL — ABNORMAL HIGH (ref 0.1–0.9)
NEUTROS ABS: 2.8 10*3/uL (ref 1.4–7.0)
NEUTROS PCT: 36 %
PLATELETS: 335 10*3/uL (ref 150–379)
RBC: 4.34 x10E6/uL (ref 3.77–5.28)
RDW: 15.7 % — AB (ref 12.3–15.4)
WBC: 7.8 10*3/uL (ref 3.4–10.8)

## 2015-09-29 LAB — COMPREHENSIVE METABOLIC PANEL
ALT: 30 IU/L (ref 0–32)
AST: 24 IU/L (ref 0–40)
Albumin/Globulin Ratio: 1.6 (ref 1.2–2.2)
Albumin: 4.7 g/dL (ref 3.6–4.8)
Alkaline Phosphatase: 77 IU/L (ref 39–117)
BUN/Creatinine Ratio: 31 — ABNORMAL HIGH (ref 11–26)
BUN: 21 mg/dL (ref 8–27)
Bilirubin Total: 0.3 mg/dL (ref 0.0–1.2)
CALCIUM: 9.9 mg/dL (ref 8.7–10.3)
CO2: 23 mmol/L (ref 18–29)
CREATININE: 0.67 mg/dL (ref 0.57–1.00)
Chloride: 100 mmol/L (ref 96–106)
GFR, EST AFRICAN AMERICAN: 110 mL/min/{1.73_m2} (ref >59)
GFR, EST NON AFRICAN AMERICAN: 95 mL/min/{1.73_m2} (ref >59)
Globulin, Total: 2.9 g/dL (ref 1.5–4.5)
Glucose: 87 mg/dL (ref 65–99)
Potassium: 4.7 mmol/L (ref 3.5–5.2)
Sodium: 139 mmol/L (ref 134–144)
TOTAL PROTEIN: 7.6 g/dL (ref 6.0–8.5)

## 2015-09-29 LAB — HEMOGLOBIN A1C
ESTIMATED AVERAGE GLUCOSE: 120 mg/dL
Hgb A1c MFr Bld: 5.8 % — ABNORMAL HIGH (ref 4.8–5.6)

## 2015-09-29 LAB — TSH: TSH: 2.38 u[IU]/mL (ref 0.450–4.500)

## 2015-09-30 ENCOUNTER — Telehealth: Payer: Self-pay

## 2015-09-30 NOTE — Telephone Encounter (Signed)
Left message for patient to call back  

## 2015-09-30 NOTE — Telephone Encounter (Signed)
-----   Message from Glean Hess, MD sent at 09/29/2015  8:03 AM EDT ----- DM is very good.  All other labs are normal.

## 2015-10-01 NOTE — Telephone Encounter (Signed)
Spoke with patient. Patient advised of all results and verbalized understanding. Will call back with any future questions or concerns. MAH  

## 2015-10-19 ENCOUNTER — Other Ambulatory Visit: Payer: Self-pay | Admitting: Internal Medicine

## 2015-10-22 ENCOUNTER — Other Ambulatory Visit: Payer: Self-pay | Admitting: Internal Medicine

## 2015-12-14 ENCOUNTER — Other Ambulatory Visit: Payer: Self-pay | Admitting: Internal Medicine

## 2015-12-14 DIAGNOSIS — Z1231 Encounter for screening mammogram for malignant neoplasm of breast: Secondary | ICD-10-CM

## 2015-12-15 ENCOUNTER — Ambulatory Visit
Admission: RE | Admit: 2015-12-15 | Discharge: 2015-12-15 | Disposition: A | Discharge status: Home / Self Care | Payer: Medicare PPO | Source: Ambulatory Visit | Attending: Internal Medicine | Admitting: Internal Medicine

## 2015-12-15 DIAGNOSIS — Z1231 Encounter for screening mammogram for malignant neoplasm of breast: Secondary | ICD-10-CM | POA: Diagnosis not present

## 2015-12-15 DIAGNOSIS — R928 Other abnormal and inconclusive findings on diagnostic imaging of breast: Secondary | ICD-10-CM | POA: Diagnosis not present

## 2015-12-21 ENCOUNTER — Other Ambulatory Visit: Payer: Self-pay | Admitting: Internal Medicine

## 2015-12-21 DIAGNOSIS — R928 Other abnormal and inconclusive findings on diagnostic imaging of breast: Secondary | ICD-10-CM

## 2015-12-27 ENCOUNTER — Other Ambulatory Visit: Payer: Self-pay | Admitting: Internal Medicine

## 2016-01-04 ENCOUNTER — Ambulatory Visit
Admission: RE | Admit: 2016-01-04 | Discharge: 2016-01-04 | Disposition: A | Discharge status: Home / Self Care | Payer: Medicare PPO | Source: Ambulatory Visit | Attending: Internal Medicine | Admitting: Internal Medicine

## 2016-01-04 DIAGNOSIS — R928 Other abnormal and inconclusive findings on diagnostic imaging of breast: Secondary | ICD-10-CM

## 2016-01-04 DIAGNOSIS — N63 Unspecified lump in breast: Secondary | ICD-10-CM | POA: Diagnosis not present

## 2016-01-04 DIAGNOSIS — R921 Mammographic calcification found on diagnostic imaging of breast: Secondary | ICD-10-CM | POA: Diagnosis not present

## 2016-01-27 ENCOUNTER — Encounter: Payer: Self-pay | Admitting: Internal Medicine

## 2016-01-28 ENCOUNTER — Encounter: Payer: Self-pay | Admitting: Internal Medicine

## 2016-01-28 ENCOUNTER — Ambulatory Visit (INDEPENDENT_AMBULATORY_CARE_PROVIDER_SITE_OTHER): Payer: Medicare PPO | Admitting: Internal Medicine

## 2016-01-28 VITALS — BP 140/100 | HR 76 | Resp 16 | Ht 66.0 in | Wt 187.6 lb

## 2016-01-28 DIAGNOSIS — Z720 Tobacco use: Secondary | ICD-10-CM

## 2016-01-28 DIAGNOSIS — I10 Essential (primary) hypertension: Secondary | ICD-10-CM

## 2016-01-28 DIAGNOSIS — E119 Type 2 diabetes mellitus without complications: Secondary | ICD-10-CM | POA: Diagnosis not present

## 2016-01-28 DIAGNOSIS — E785 Hyperlipidemia, unspecified: Secondary | ICD-10-CM | POA: Diagnosis not present

## 2016-01-28 DIAGNOSIS — M129 Arthropathy, unspecified: Secondary | ICD-10-CM

## 2016-01-28 DIAGNOSIS — F17200 Nicotine dependence, unspecified, uncomplicated: Secondary | ICD-10-CM

## 2016-01-28 DIAGNOSIS — M1711 Unilateral primary osteoarthritis, right knee: Secondary | ICD-10-CM

## 2016-01-28 DIAGNOSIS — Z0000 Encounter for general adult medical examination without abnormal findings: Secondary | ICD-10-CM

## 2016-01-28 DIAGNOSIS — E1169 Type 2 diabetes mellitus with other specified complication: Secondary | ICD-10-CM

## 2016-01-28 DIAGNOSIS — N6012 Diffuse cystic mastopathy of left breast: Secondary | ICD-10-CM | POA: Diagnosis not present

## 2016-01-28 DIAGNOSIS — Z23 Encounter for immunization: Secondary | ICD-10-CM | POA: Diagnosis not present

## 2016-01-28 DIAGNOSIS — M810 Age-related osteoporosis without current pathological fracture: Secondary | ICD-10-CM | POA: Diagnosis not present

## 2016-01-28 LAB — POCT URINALYSIS DIPSTICK
BILIRUBIN UA: NEGATIVE
GLUCOSE UA: NEGATIVE
Ketones, UA: NEGATIVE
Leukocytes, UA: NEGATIVE
NITRITE UA: NEGATIVE
Protein, UA: NEGATIVE
RBC UA: NEGATIVE
SPEC GRAV UA: 1.02
pH, UA: 7.5

## 2016-01-28 NOTE — Addendum Note (Signed)
Addended by: Glean Hess on: 01/28/2016 12:31 PM   Modules accepted: Orders

## 2016-01-28 NOTE — Patient Instructions (Addendum)
Health Maintenance  Topic Date Due  . Hepatitis C Screening  Sep 19, 1955  . FOOT EXAM  09/29/1963  . HIV Screening  09/28/1968  . PNEUMOCOCCAL POLYSACCHARIDE VACCINE (2) 07/05/2013  . COLONOSCOPY  07/05/2013  . OPHTHALMOLOGY EXAM  12/03/2015  . INFLUENZA VACCINE  02/02/2016  . HEMOGLOBIN A1C  03/30/2016  . MAMMOGRAM  12/14/2017  . TETANUS/TDAP  07/05/2021  . ZOSTAVAX  Completed  . PAP SMEAR  Excluded    Pneumococcal Conjugate Vaccine (PCV13)  1. Why get vaccinated? Vaccination can protect both children and adults from pneumococcal disease. Pneumococcal disease is caused by bacteria that can spread from person to person through close contact. It can cause ear infections, and it can also lead to more serious infections of the:  Lungs (pneumonia),  Blood (bacteremia), and  Covering of the brain and spinal cord (meningitis). Pneumococcal pneumonia is most common among adults. Pneumococcal meningitis can cause deafness and brain damage, and it kills about 1 child in 10 who get it. Anyone can get pneumococcal disease, but children under 62 years of age and adults 62 years and older, people with certain medical conditions, and cigarette smokers are at the highest risk. Before there was a vaccine, the [place] saw:  more than 700 cases of meningitis,  about 13,000 blood infections,  about 5 million ear infections, and  about 200 deaths in children under 5 each year from pneumococcal disease. Since vaccine became available, severe pneumococcal disease in these children has fallen by 88%. About 18,000 older adults die of pneumococcal disease each year in the [place]. Treatment of pneumococcal infections with penicillin and other drugs is not as effective as it used to be, because some strains of the disease have become resistant to these drugs. This makes prevention of the disease, through vaccination, even more important. 2. PCV13 vaccine Pneumococcal conjugate vaccine  (called PCV13) protects against 13 types of pneumococcal bacteria. PCV13 is routinely given to children at 2, 4, 6, and 70-3 months of age. It is also recommended for children and adults 62 to 62 years of age with certain health conditions, and for all adults 62 years of age and older. Your doctor can give you details. 3. Some people should not get this vaccine Anyone who has ever had a life-threatening allergic reaction to a dose of this vaccine, to an earlier pneumococcal vaccine called PCV7, or to any vaccine containing diphtheria toxoid (for example, DTaP), should not get PCV13. Anyone with a severe allergy to any component of PCV13 should not get the vaccine. Tell your doctor if the person being vaccinated has any severe allergies. If the person scheduled for vaccination is not feeling well, your healthcare provider might decide to reschedule the shot on another day. 4. Risks of a vaccine reaction With any medicine, including vaccines, there is a chance of reactions. These are usually mild and go away on their own, but serious reactions are also possible. Problems reported following PCV13 varied by age and dose in the series. The most common problems reported among children were:  About half became drowsy after the shot, had a temporary loss of appetite, or had redness or tenderness where the shot was given.  About 1 out of 3 had swelling where the shot was given.  About 1 out of 3 had a mild fever, and about 1 in 20 had a fever over 102.35F.  Up to about 8 out of 10 became fussy or irritable. Adults have reported pain, redness, and swelling  where the shot was given; also mild fever, fatigue, headache, chills, or muscle pain. Young children who get PCV13 along with inactivated flu vaccine at the same time may be at increased risk for seizures caused by fever. Ask your doctor for more information. Problems that could happen after any vaccine:  People sometimes faint after a medical  procedure, including vaccination. Sitting or lying down for about 15 minutes can help prevent fainting, and injuries caused by a fall. Tell your doctor if you feel dizzy, or have vision changes or ringing in the ears.  Some older children and adults get severe pain in the shoulder and have difficulty moving the arm where a shot was given. This happens very rarely.  Any medication can cause a severe allergic reaction. Such reactions from a vaccine are very rare, estimated at about 1 in a million doses, and would happen within a few minutes to a few hours after the vaccination. As with any medicine, there is a very small chance of a vaccine causing a serious injury or death. The safety of vaccines is always being monitored. For more information, visit: http://www.aguilar.org/ 5. What if there is a serious reaction? What should I look for?  Look for anything that concerns you, such as signs of a severe allergic reaction, very high fever, or unusual behavior. Signs of a severe allergic reaction can include hives, swelling of the face and throat, difficulty breathing, a fast heartbeat, dizziness, and weakness-usually within a few minutes to a few hours after the vaccination. What should I do?  If you think it is a severe allergic reaction or other emergency that can't wait, call 9-1-1 or get the person to the nearest hospital. Otherwise, call your doctor. Reactions should be reported to the Vaccine Adverse Event Reporting System (VAERS). Your doctor should file this report, or you can do it yourself through the VAERS web site at www.vaers.SamedayNews.es, or by calling 250-083-6068. VAERS does not give medical advice. 6. The National Vaccine Injury Compensation Program The [employer] Vaccine Injury Compensation Program (VICP) is a federal program that was created to compensate people who may have been injured by certain vaccines. Persons who believe they may have been injured by a vaccine can learn about  the program and about filing a claim by calling (541)552-1636 or visiting the [place] website at GoldCloset.com.ee. There is a time limit to file a claim for compensation. 7. How can I learn more?  Ask your healthcare provider. He or she can give you the vaccine package insert or suggest other sources of information.  Call your local or state health department.  Contact the Centers for Disease Control and Prevention (CDC):  Call (684) 012-4137 (1-800-CDC-INFO) or  Visit CDC's website at http://hunter.com/ Vaccine Information Statement PCV13 Vaccine (05/08/2014)   This information is not intended to replace advice given to you by your health care provider. Make sure you discuss any questions you have with your health care provider.   Document Released: 04/17/2006 Document Revised: 07/11/2014 Document Reviewed: 05/15/2014 Elsevier Interactive Patient Education [employer].

## 2016-01-28 NOTE — Progress Notes (Signed)
Patient: Kiara Campbell, Female    DOB: 27-Dec-1955, 60 y.o.   MRN: [id number] Visit Date: 01/28/2016  Today's Provider: Halina Maidens, MD   Chief Complaint  Patient presents with  . Medicare Wellness   Subjective:    Annual wellness visit Kiara Campbell is a 60 y.o. female who presents today for her Subsequent Annual Wellness Visit. She feels fairly well. She reports exercising none. She reports she is sleeping fairly well.  She had a mammogram with a nodule on the left - she opted for 6 mo follow up.  She is due for Prevnar-13. ----------------------------------------------------------- Diabetes  She presents for her follow-up diabetic visit. She has type 2 diabetes mellitus. Her disease course has been stable. Pertinent negatives for hypoglycemia include no dizziness, headaches, nervousness/anxiousness or tremors. Pertinent negatives for diabetes include no chest pain, no fatigue, no polydipsia and no polyuria. Current diabetic treatment includes oral agent (monotherapy). She is compliant with treatment most of the time. An ACE inhibitor/angiotensin II receptor blocker is being taken.  Hypertension  This is a chronic problem. The current episode started more than 1 year ago. The problem is unchanged. The problem is controlled. Pertinent negatives include no chest pain, headaches, palpitations or shortness of breath. Risk factors for coronary artery disease include diabetes mellitus and dyslipidemia.  Hyperlipidemia  This is a chronic problem. The problem is controlled. Recent lipid tests were reviewed and are normal. Pertinent negatives include no chest pain or shortness of breath. Current antihyperlipidemic treatment includes statins. The current treatment provides significant improvement of lipids. Risk factors for coronary artery disease include diabetes mellitus and hypertension.  Chronic back pain - on gabapentin.  No longer seeing Pain management. OA knee - injured in college.  Now  persistently painful and swollen.  Last ortho evaluation and xray about 12 years ago.  Lab Results  Component Value Date   HGBA1C 5.8 (H) 09/28/2015     Review of Systems  Constitutional: Negative for chills, fatigue and fever.  HENT: Negative for congestion, hearing loss, tinnitus, trouble swallowing and voice change.   Eyes: Negative for visual disturbance.  Respiratory: Negative for cough, chest tightness, shortness of breath and wheezing.   Cardiovascular: Negative for chest pain, palpitations and leg swelling.  Gastrointestinal: Negative for abdominal pain, constipation, diarrhea and vomiting.  Endocrine: Negative for polydipsia and polyuria.  Genitourinary: Negative for dysuria, frequency, genital sores, vaginal bleeding and vaginal discharge.  Musculoskeletal: Positive for arthralgias, back pain, gait problem and joint swelling.  Skin: Negative for color change and rash.  Neurological: Negative for dizziness, tremors, light-headedness and headaches.  Hematological: Negative for adenopathy. Does not bruise/bleed easily.  Psychiatric/Behavioral: Negative for dysphoric mood and sleep disturbance. The patient is not nervous/anxious.     Social History   Social History  . Marital status: Married    Spouse name: N/A  . Number of children: N/A  . Years of education: N/A   Occupational History  . Not on file.   Social History Main Topics  . Smoking status: Current Every Day Smoker    Packs/day: 0.50    Years: 30.00    Types: Cigarettes  . Smokeless tobacco: Not on file  . Alcohol use 1.2 oz/week    2 Standard drinks or equivalent per week     Comment: occasional  . Drug use: No  . Sexual activity: Not on file   Other Topics Concern  . Not on file   Social History Narrative  . No narrative on  file    Patient Active Problem List   Diagnosis Date Noted  . Controlled type 2 diabetes mellitus without complication, without long-term current use of insulin ([place])  06/02/2015  . Fibrocystic breast disease 12/03/2014  . Colon polyp 12/03/2014  . Diverticulitis of colon 12/03/2014  . Essential (primary) hypertension 12/03/2014  . Herniated nucleus pulposus 12/03/2014  . OP (osteoporosis) 12/03/2014  . Hyperlipidemia associated with type 2 diabetes mellitus ([place]) 12/03/2014  . Calcium blood increased 12/23/2013    Past Surgical History:  Procedure Laterality Date  . ABDOMINAL HYSTERECTOMY     partial  . ABDOMINAL HYSTERECTOMY    . APPENDECTOMY    . BREAST CYST ASPIRATION     neg not sure which side  . TONSILLECTOMY    . VARICOSE VEIN SURGERY      Her family history includes CAD in her father; Diabetes in her maternal grandmother.    Previous Medications   ACCU-CHEK SOFTCLIX LANCETS LANCETS    Use as instructed   ALCOHOL SWABS (B-D SINGLE USE SWABS REGULAR) PADS    1 each by Does not apply route daily.   BLOOD GLUCOSE MONITORING SUPPL (TRUE METRIX AIR GLUCOSE METER) DEVI    1 each by Does not apply route daily.   CALCIUM CARB-CHOLECALCIFEROL (CALCIUM + D3) 600-200 MG-UNIT TABS    Take 1 tablet by mouth daily.   FLUTICASONE (FLONASE) 50 MCG/ACT NASAL SPRAY       GABAPENTIN (NEURONTIN) 100 MG CAPSULE    TAKE 1 TO 3 CAPSULES AT BEDTIME   GLUCOSE BLOOD (TRUE METRIX BLOOD GLUCOSE TEST) TEST STRIP    1 each by Other route daily.   LANCET DEVICES (ACCU-CHEK SOFTCLIX) LANCETS    1 each by Other route daily. Use as instructed   LANCETS MISC. (ACCU-CHEK SOFTCLIX LANCET DEV) KIT    1 each by Does not apply route daily.   LOSARTAN-HYDROCHLOROTHIAZIDE (HYZAAR) 50-12.5 MG TABLET    Take 1 tablet by mouth daily.   METFORMIN (GLUCOPHAGE-XR) 500 MG 24 HR TABLET    Take 1 tablet (500 mg total) by mouth daily with breakfast.   METOPROLOL SUCCINATE (TOPROL-XL) 100 MG 24 HR TABLET    TAKE 1 AND 1/2 TABLET DAILY   MUPIROCIN OINTMENT (BACTROBAN) 2 %    PLACE 1 APPLICATION INTO THE NOSE 2 TIMES A DAILY.   MUPIROCIN OINTMENT (BACTROBAN) 2 %    PLACE 1 APPLICATION  INTO THE NOSE 2 TIMES A DAILY.   NAPROXEN SODIUM 220 MG CAPS    Take 1 capsule by mouth daily as needed.   SIMVASTATIN (ZOCOR) 40 MG TABLET    Take 1 tablet (40 mg total) by mouth at bedtime.    Patient Care Team: Glean Hess, MD as PCP - General (Family Medicine)     Objective:   Vitals: BP (!) 140/100 (BP Location: Right Arm, Patient Position: Sitting, Cuff Size: Small)   Pulse 76   Resp 16   Ht '5\' 6"'$  (1.676 m)   Wt 187 lb 9.6 oz (85.1 kg)   LMP 09/27/1995   BMI 30.28 kg/m   Physical Exam  Constitutional: She is oriented to person, place, and time. She appears well-developed and well-nourished. No distress.  HENT:  Head: Normocephalic and atraumatic.  Right Ear: Tympanic membrane and ear canal normal.  Left Ear: Tympanic membrane and ear canal normal.  Nose: Right sinus exhibits no maxillary sinus tenderness. Left sinus exhibits no maxillary sinus tenderness.  Mouth/Throat: Uvula is midline and oropharynx is clear  and moist.  Eyes: Conjunctivae and EOM are normal. Right eye exhibits no discharge. Left eye exhibits no discharge. No scleral icterus.  Neck: Normal range of motion. Carotid bruit is not present. No erythema present. No thyromegaly present.  Cardiovascular: Normal rate, regular rhythm, normal heart sounds and normal pulses.   Pulmonary/Chest: Effort normal. No respiratory distress. She has no wheezes. Right breast exhibits no mass, no nipple discharge, no skin change and no tenderness. Left breast exhibits no mass, no nipple discharge, no skin change and no tenderness.  Abdominal: Soft. Bowel sounds are normal. There is no hepatosplenomegaly. There is no tenderness. There is no CVA tenderness.  Musculoskeletal: She exhibits no edema.       Right knee: She exhibits swelling. She exhibits no effusion. Tenderness found.       Lumbar back: She exhibits decreased range of motion and tenderness.  Lymphadenopathy:    She has no cervical adenopathy.    She has no  axillary adenopathy.  Neurological: She is alert and oriented to person, place, and time. She has normal reflexes. No cranial nerve deficit or sensory deficit.  Foot exam - normal nails, pulses and sensation bilaterally.  Skin dusky of feet and toes.   Skin: Skin is warm, dry and intact. No rash noted.  Psychiatric: She has a normal mood and affect. Her speech is normal and behavior is normal. Thought content normal.  Nursing note and vitals reviewed.   Activities of Daily Living In your present state of health, do you have any difficulty performing the following activities: 01/28/2016  Hearing? N  Vision? N  Difficulty concentrating or making decisions? N  Walking or climbing stairs? Y  Dressing or bathing? N  Doing errands, shopping? N  Preparing Food and eating ? N  Using the Toilet? N  In the past six months, have you accidently leaked urine? Y  Do you have problems with loss of bowel control? N  Managing your Medications? N  Managing your Finances? N  Housekeeping or managing your Housekeeping? N  Some recent data might be hidden    Fall Risk Assessment Fall Risk  01/28/2016  Falls in the past year? No      Depression Screen PHQ 2/9 Scores 01/28/2016  PHQ - 2 Score 0    Cognitive Testing - 6-CIT   Correct? Score   What year is it? yes 0 Yes = 0    No = 4  What month is it? yes 0 Yes = 0    No = 3  Remember:     Pia [address]au, [place], [place]     What time is it? yes 0 Yes = 0    No = 3  Count backwards from 20 to 1 yes 0 Correct = 0    1 error = 2   More than 1 error = 4  Say the months of the year in reverse. yes 0 Correct = 0    1 error = 2   More than 1 error = 4  What address did I ask you to remember? yes 0 Correct = 0  1 error = 2    2 error = 4    3 error = 6    4 error = 8    All wrong = 10       TOTAL SCORE  0/28   Interpretation:  Normal  Normal (0-7) Abnormal (8-28)        Medicare Annual Wellness  Visit Summary:  Reviewed patient's Family  Medical History Reviewed and updated list of patient's medical providers Assessment of cognitive impairment was done Assessed patient's functional ability Established a written schedule for health screening [place] Completed and Reviewed  Exercise Activities and Dietary recommendations Goals    . Family Time          Has raised grand kids for over 12 months after loss of daughter in law and wants to keep family time important.        Immunization History  Administered Date(s) Administered  . Influenza,inj,Quad PF,36+ Mos 03/10/2015  . Pneumococcal Polysaccharide-23 07/05/2008  . Tdap 07/06/2011  . Zoster 07/05/2012    Health Maintenance  Topic Date Due  . Hepatitis C Screening  12-Nov-1955  . FOOT EXAM  09/29/1963  . HIV Screening  09/28/1968  . PNEUMOCOCCAL POLYSACCHARIDE VACCINE (2) 07/05/2013  . COLONOSCOPY  07/05/2013  . OPHTHALMOLOGY EXAM  12/03/2015  . INFLUENZA VACCINE  02/02/2016  . HEMOGLOBIN A1C  03/30/2016  . MAMMOGRAM  12/14/2017  . TETANUS/TDAP  07/05/2021  . ZOSTAVAX  Completed  . PAP SMEAR  Excluded     Discussed health benefits of physical activity, and encouraged her to engage in regular exercise appropriate for her age and condition.    ------------------------------------------------------------------------------------------------------------   Assessment & Plan:  1. Medicare annual wellness visit, subsequent Measures satisfied - POCT urinalysis dipstick  2. Smoker - Nurse to provide smoking / tobacco cessation education  3. Essential (primary) hypertension controlled - CBC with Differential/Platelet  4. Controlled type 2 diabetes mellitus without complication, without long-term current use of insulin (HCC) Continue oral medication - Comprehensive metabolic panel - Hemoglobin A1c - TSH - Microalbumin / creatinine urine ratio  5. Hyperlipidemia associated with type 2 diabetes mellitus ([place]) On statin therapy -  Lipid panel  6. OP (osteoporosis) Improved at last DEXA  7. Need for pneumococcal vaccination - Pneumococcal conjugate vaccine 13-valent IM  8. Arthritis of right knee - Ambulatory referral to Orthopedic Surgery  9. Fibrocystic breast disease, left Follow up mammogram/US in 6 months   Halina Maidens, MD [facility]  01/28/2016

## 2016-01-29 LAB — CBC WITH DIFFERENTIAL/PLATELET
BASOS: 0 %
Basophils Absolute: 0 10*3/uL (ref 0.0–0.2)
EOS (ABSOLUTE): 0.1 10*3/uL (ref 0.0–0.4)
Eos: 1 %
HEMATOCRIT: 40.8 % (ref 34.0–46.6)
Hemoglobin: 13.9 g/dL (ref 11.1–15.9)
Immature Grans (Abs): 0 10*3/uL (ref 0.0–0.1)
Immature Granulocytes: 0 %
LYMPHS ABS: 2.2 10*3/uL (ref 0.7–3.1)
Lymphs: 35 %
MCH: 32.1 pg (ref 26.6–33.0)
MCHC: 34.1 g/dL (ref 31.5–35.7)
MCV: 94 fL (ref 79–97)
MONOS ABS: 1.7 10*3/uL — AB (ref 0.1–0.9)
Monocytes: 27 %
NEUTROS ABS: 2.3 10*3/uL (ref 1.4–7.0)
Neutrophils: 37 %
Platelets: 268 10*3/uL (ref 150–379)
RBC: 4.33 x10E6/uL (ref 3.77–5.28)
RDW: 15.3 % (ref 12.3–15.4)
WBC: 6.3 10*3/uL (ref 3.4–10.8)

## 2016-01-29 LAB — COMPREHENSIVE METABOLIC PANEL
A/G RATIO: 1.6 (ref 1.2–2.2)
ALBUMIN: 4.5 g/dL (ref 3.6–4.8)
ALK PHOS: 67 IU/L (ref 39–117)
ALT: 26 IU/L (ref 0–32)
AST: 24 IU/L (ref 0–40)
BUN / CREAT RATIO: 18 (ref 12–28)
BUN: 12 mg/dL (ref 8–27)
Bilirubin Total: 0.4 mg/dL (ref 0.0–1.2)
CALCIUM: 9.7 mg/dL (ref 8.7–10.3)
CO2: 26 mmol/L (ref 18–29)
Chloride: 98 mmol/L (ref 96–106)
Creatinine, Ser: 0.68 mg/dL (ref 0.57–1.00)
GFR calc Af Amer: 108 mL/min/{1.73_m2} (ref >59)
GFR, EST NON AFRICAN AMERICAN: 94 mL/min/{1.73_m2} (ref >59)
GLOBULIN, TOTAL: 2.8 g/dL (ref 1.5–4.5)
Glucose: 122 mg/dL — ABNORMAL HIGH (ref 65–99)
POTASSIUM: 4 mmol/L (ref 3.5–5.2)
SODIUM: 142 mmol/L (ref 134–144)
Total Protein: 7.3 g/dL (ref 6.0–8.5)

## 2016-01-29 LAB — TSH: TSH: 1.64 u[IU]/mL (ref 0.450–4.500)

## 2016-01-29 LAB — LIPID PANEL
CHOL/HDL RATIO: 3.1 ratio (ref 0.0–4.4)
Cholesterol, Total: 135 mg/dL (ref 100–199)
HDL: 44 mg/dL (ref >39)
LDL Calculated: 54 mg/dL (ref 0–99)
Triglycerides: 183 mg/dL — ABNORMAL HIGH (ref 0–149)
VLDL CHOLESTEROL CAL: 37 mg/dL (ref 5–40)

## 2016-01-29 LAB — HEMOGLOBIN A1C
ESTIMATED AVERAGE GLUCOSE: 131 mg/dL
Hgb A1c MFr Bld: 6.2 % — ABNORMAL HIGH (ref 4.8–5.6)

## 2016-02-03 DIAGNOSIS — M1711 Unilateral primary osteoarthritis, right knee: Secondary | ICD-10-CM | POA: Diagnosis not present

## 2016-02-03 DIAGNOSIS — M2392 Unspecified internal derangement of left knee: Secondary | ICD-10-CM | POA: Diagnosis not present

## 2016-02-03 DIAGNOSIS — M25561 Pain in right knee: Secondary | ICD-10-CM | POA: Diagnosis not present

## 2016-02-03 DIAGNOSIS — M1712 Unilateral primary osteoarthritis, left knee: Secondary | ICD-10-CM | POA: Diagnosis not present

## 2016-02-17 DIAGNOSIS — M1711 Unilateral primary osteoarthritis, right knee: Secondary | ICD-10-CM | POA: Diagnosis not present

## 2016-04-26 ENCOUNTER — Ambulatory Visit (INDEPENDENT_AMBULATORY_CARE_PROVIDER_SITE_OTHER): Payer: Medicare PPO

## 2016-04-26 DIAGNOSIS — Z23 Encounter for immunization: Secondary | ICD-10-CM | POA: Diagnosis not present

## 2016-08-01 ENCOUNTER — Encounter: Payer: Self-pay | Admitting: Internal Medicine

## 2016-08-01 ENCOUNTER — Telehealth: Payer: Self-pay | Admitting: *Deleted

## 2016-08-01 ENCOUNTER — Ambulatory Visit (INDEPENDENT_AMBULATORY_CARE_PROVIDER_SITE_OTHER): Payer: Medicare PPO | Admitting: Internal Medicine

## 2016-08-01 VITALS — BP 160/98 | HR 86 | Temp 97.5°F | Ht 66.0 in | Wt 189.0 lb

## 2016-08-01 DIAGNOSIS — I10 Essential (primary) hypertension: Secondary | ICD-10-CM | POA: Diagnosis not present

## 2016-08-01 DIAGNOSIS — E1169 Type 2 diabetes mellitus with other specified complication: Secondary | ICD-10-CM

## 2016-08-01 DIAGNOSIS — E785 Hyperlipidemia, unspecified: Secondary | ICD-10-CM

## 2016-08-01 DIAGNOSIS — E119 Type 2 diabetes mellitus without complications: Secondary | ICD-10-CM | POA: Diagnosis not present

## 2016-08-01 DIAGNOSIS — M170 Bilateral primary osteoarthritis of knee: Secondary | ICD-10-CM

## 2016-08-01 MED ORDER — LOSARTAN POTASSIUM-HCTZ 100-25 MG PO TABS: 1.0000 | ORAL_TABLET | Freq: Every day | ORAL | 3 refills | Status: DC

## 2016-08-01 NOTE — Progress Notes (Signed)
Date:  08/01/2016   Name:  Kiara Campbell   DOB:  Jan 04, 1956   MRN:  [id number]   Chief Complaint: Hypertension and Diabetes Hypertension  This is a chronic problem. The problem is controlled. Pertinent negatives include no chest pain, headaches, palpitations or shortness of breath. Past treatments include angiotensin blockers, diuretics and beta blockers. The current treatment provides significant improvement.  Diabetes  She presents for her follow-up diabetic visit. She has type 2 diabetes mellitus. Pertinent negatives for hypoglycemia include no headaches or tremors. Pertinent negatives for diabetes include no chest pain, no fatigue, no polydipsia and no polyuria. Current diabetic treatment includes oral agent (monotherapy).  Hyperlipidemia  This is a chronic problem. Pertinent negatives include no chest pain or shortness of breath. Current antihyperlipidemic treatment includes statins.  OA knee - did not respond to cortisone.  Likely needs knee replacement.  Had been using naproxen but stopped by surgeon due to elevated BP - BP has not improved.  Using Arnica and wants to try tumeric.  Lab Results  Component Value Date   HGBA1C 6.2 (H) 01/28/2016   Lab Results  Component Value Date   CHOL 135 01/28/2016   HDL 44 01/28/2016   LDLCALC 54 01/28/2016   TRIG 183 (H) 01/28/2016   CHOLHDL 3.1 01/28/2016   Lab Results  Component Value Date   CREATININE 0.68 01/28/2016   Wt Readings from Last 3 Encounters:  08/01/16 189 lb (85.7 kg)  01/28/16 187 lb 9.6 oz (85.1 kg)  09/28/15 182 lb (82.6 kg)     Review of Systems  Constitutional: Negative for appetite change, fatigue, fever and unexpected weight change.  HENT: Negative for tinnitus and trouble swallowing.   Eyes: Negative for visual disturbance.  Respiratory: Negative for cough, chest tightness and shortness of breath.   Cardiovascular: Negative for chest pain, palpitations and leg swelling.  Gastrointestinal: Negative for  abdominal pain.  Endocrine: Negative for polydipsia and polyuria.  Genitourinary: Negative for dysuria and hematuria.  Musculoskeletal: Positive for arthralgias, back pain and gait problem.  Neurological: Negative for tremors, numbness and headaches.  Psychiatric/Behavioral: Negative for dysphoric mood.    Patient Active Problem List   Diagnosis Date Noted  . Osteoarthritis of both knees 08/01/2016  . Controlled type 2 diabetes mellitus without complication, without long-term current use of insulin ([place]) 06/02/2015  . Fibrocystic breast disease 12/03/2014  . Colon polyp 12/03/2014  . Diverticulitis of colon 12/03/2014  . Essential (primary) hypertension 12/03/2014  . Herniated nucleus pulposus 12/03/2014  . OP (osteoporosis) 12/03/2014  . Hyperlipidemia associated with type 2 diabetes mellitus ([place]) 12/03/2014  . Calcium blood increased 12/23/2013    Prior to Admission medications   Medication Sig Start Date End Date Taking? Authorizing Provider  ACCU-CHEK SOFTCLIX LANCETS lancets Use as instructed 09/21/15   Glean Hess, MD  Alcohol Swabs (B-D SINGLE USE SWABS REGULAR) PADS 1 each by Does not apply route daily. 07/29/15   Glean Hess, MD  Blood Glucose Monitoring Suppl (TRUE METRIX AIR GLUCOSE METER) DEVI 1 each by Does not apply route daily. 07/29/15   Glean Hess, MD  Calcium Carb-Cholecalciferol (CALCIUM + D3) 600-200 MG-UNIT TABS Take 1 tablet by mouth daily.    Historical Provider, MD  fluticasone Asencion Islam) 50 MCG/ACT nasal spray  02/12/15   Historical Provider, MD  gabapentin (NEURONTIN) 100 MG capsule TAKE 1 TO 3 CAPSULES AT BEDTIME 12/27/15   Glean Hess, MD  glucose blood (TRUE METRIX BLOOD GLUCOSE TEST) test  strip 1 each by Other route daily. 07/29/15   Glean Hess, MD  Lancet Devices [facility]) lancets 1 each by Other route daily. Use as instructed 03/12/15   Glean Hess, MD  Lancets Misc. (ACCU-CHEK SOFTCLIX LANCET DEV) KIT 1 each by Does  not apply route daily. 09/21/15   Glean Hess, MD  losartan-hydrochlorothiazide (HYZAAR) 50-12.5 MG tablet Take 1 tablet by mouth daily. 09/28/15   Glean Hess, MD  metFORMIN (GLUCOPHAGE-XR) 500 MG 24 hr tablet Take 1 tablet (500 mg total) by mouth daily with breakfast. 09/28/15   Glean Hess, MD  metoprolol succinate (TOPROL-XL) 100 MG 24 hr tablet TAKE 1 AND 1/2 TABLET DAILY 09/28/15   Glean Hess, MD  mupirocin ointment (BACTROBAN) 2 % PLACE 1 APPLICATION INTO THE NOSE 2 TIMES A DAILY. 10/20/15   Glean Hess, MD  mupirocin ointment (BACTROBAN) 2 % PLACE 1 APPLICATION INTO THE NOSE 2 TIMES A DAILY. 10/23/15   Glean Hess, MD  Naproxen Sodium 220 MG CAPS Take 1 capsule by mouth daily as needed.    Historical Provider, MD  simvastatin (ZOCOR) 40 MG tablet Take 1 tablet (40 mg total) by mouth at bedtime. 09/28/15   Glean Hess, MD    Allergies  Allergen Reactions  . Glipizide Nausea Only    Past Surgical History:  Procedure Laterality Date  . ABDOMINAL HYSTERECTOMY     partial  . ABDOMINAL HYSTERECTOMY    . APPENDECTOMY    . BREAST CYST ASPIRATION     neg not sure which side  . TONSILLECTOMY    . VARICOSE VEIN SURGERY      Social History  Substance Use Topics  . Smoking status: Current Every Day Smoker    Packs/day: 0.50    Years: 30.00    Types: Cigarettes  . Smokeless tobacco: Current User  . Alcohol use 1.2 oz/week    2 Standard drinks or equivalent per week     Comment: occasional     Medication list has been reviewed and updated.   Physical Exam  Constitutional: She is oriented to person, place, and time. She appears well-developed. No distress.  HENT:  Head: Normocephalic and atraumatic.  Cardiovascular: Normal rate, regular rhythm and normal heart sounds.   Pulmonary/Chest: Effort normal and breath sounds normal. No respiratory distress. She has no wheezes. She has no rhonchi.  Musculoskeletal:       Right knee: She exhibits  decreased range of motion. She exhibits no swelling and no effusion.       Left knee: She exhibits decreased range of motion. She exhibits no swelling and no effusion.  Neurological: She is alert and oriented to person, place, and time.  Skin: Skin is warm and dry. No rash noted.  Psychiatric: She has a normal mood and affect. Her behavior is normal. Thought content normal.  Nursing note and vitals reviewed.   BP (!) 160/98   Pulse 86   Temp 97.5 F (36.4 C)   Ht '5\' 6"'$  (1.676 m)   Wt 189 lb (85.7 kg)   LMP 09/27/1995   SpO2 98%   BMI 30.51 kg/m   Assessment and Plan: 1. Essential (primary) hypertension Increase dose of hyzaar - losartan-hydrochlorothiazide (HYZAAR) 100-25 MG tablet; Take 1 tablet by mouth daily.  Dispense: 90 tablet; Refill: 3  2. Controlled type 2 diabetes mellitus without complication, without long-term current use of insulin (HCC) Continue metformin - Hemoglobin O2V - Basic metabolic panel  3. Hyperlipidemia associated with type 2 diabetes mellitus ([place]) On statin therapy  4. Primary osteoarthritis of both knees Can resume naproxen, continue Arnica Try tumeric Monitor renal function   Halina Maidens, MD [facility]  08/01/2016

## 2016-08-01 NOTE — Telephone Encounter (Signed)
Patient called and states she needs a referral for a repeat of her mammogram for his year. She goes to [facility]. Patient is requesting a call back. Her number is [phone]   Please advise.

## 2016-08-02 ENCOUNTER — Other Ambulatory Visit: Payer: Self-pay | Admitting: Internal Medicine

## 2016-08-02 DIAGNOSIS — R928 Other abnormal and inconclusive findings on diagnostic imaging of breast: Secondary | ICD-10-CM

## 2016-08-02 LAB — HEMOGLOBIN A1C
Est. average glucose Bld gHb Est-mCnc: 128 mg/dL
Hgb A1c MFr Bld: 6.1 % — ABNORMAL HIGH (ref 4.8–5.6)

## 2016-08-02 LAB — BASIC METABOLIC PANEL
BUN/Creatinine Ratio: 22 (ref 12–28)
BUN: 14 mg/dL (ref 8–27)
CALCIUM: 9.9 mg/dL (ref 8.7–10.3)
CO2: 29 mmol/L (ref 18–29)
CREATININE: 0.63 mg/dL (ref 0.57–1.00)
Chloride: 98 mmol/L (ref 96–106)
GFR calc Af Amer: 111 mL/min/{1.73_m2} (ref >59)
GFR, EST NON AFRICAN AMERICAN: 96 mL/min/{1.73_m2} (ref >59)
Glucose: 102 mg/dL — ABNORMAL HIGH (ref 65–99)
Potassium: 3.9 mmol/L (ref 3.5–5.2)
Sodium: 142 mmol/L (ref 134–144)

## 2016-08-02 NOTE — Telephone Encounter (Signed)
Dr. Army Melia sending referral to [facility] for mommagram

## 2016-08-08 ENCOUNTER — Other Ambulatory Visit: Payer: Self-pay | Admitting: Internal Medicine

## 2016-08-08 DIAGNOSIS — E1169 Type 2 diabetes mellitus with other specified complication: Secondary | ICD-10-CM

## 2016-08-08 DIAGNOSIS — I10 Essential (primary) hypertension: Secondary | ICD-10-CM

## 2016-08-08 DIAGNOSIS — E119 Type 2 diabetes mellitus without complications: Secondary | ICD-10-CM

## 2016-08-08 DIAGNOSIS — E785 Hyperlipidemia, unspecified: Principal | ICD-10-CM

## 2016-08-15 ENCOUNTER — Other Ambulatory Visit: Payer: Self-pay | Admitting: Internal Medicine

## 2016-08-17 ENCOUNTER — Ambulatory Visit: Payer: Medicare PPO

## 2016-08-17 ENCOUNTER — Other Ambulatory Visit: Payer: Medicare PPO

## 2016-09-06 ENCOUNTER — Ambulatory Visit
Admission: RE | Admit: 2016-09-06 | Discharge: 2016-09-06 | Disposition: A | Discharge status: Home / Self Care | Payer: Medicare PPO | Source: Ambulatory Visit | Attending: Internal Medicine | Admitting: Internal Medicine

## 2016-09-06 ENCOUNTER — Other Ambulatory Visit: Payer: Self-pay | Admitting: Internal Medicine

## 2016-09-06 DIAGNOSIS — N6489 Other specified disorders of breast: Secondary | ICD-10-CM | POA: Diagnosis not present

## 2016-09-06 DIAGNOSIS — R928 Other abnormal and inconclusive findings on diagnostic imaging of breast: Secondary | ICD-10-CM

## 2016-11-07 ENCOUNTER — Other Ambulatory Visit: Payer: Self-pay | Admitting: Internal Medicine

## 2017-02-06 ENCOUNTER — Telehealth: Payer: Self-pay | Admitting: Internal Medicine

## 2017-02-06 ENCOUNTER — Ambulatory Visit: Payer: Medicare PPO

## 2017-02-07 ENCOUNTER — Other Ambulatory Visit: Payer: Self-pay | Admitting: Internal Medicine

## 2017-02-07 ENCOUNTER — Encounter: Payer: Self-pay | Admitting: Internal Medicine

## 2017-02-07 ENCOUNTER — Ambulatory Visit (INDEPENDENT_AMBULATORY_CARE_PROVIDER_SITE_OTHER): Payer: Medicare PPO | Admitting: Internal Medicine

## 2017-02-07 VITALS — BP 136/74 | HR 70 | Ht 66.0 in | Wt 187.0 lb

## 2017-02-07 DIAGNOSIS — I10 Essential (primary) hypertension: Secondary | ICD-10-CM | POA: Diagnosis not present

## 2017-02-07 DIAGNOSIS — M170 Bilateral primary osteoarthritis of knee: Secondary | ICD-10-CM | POA: Diagnosis not present

## 2017-02-07 DIAGNOSIS — D126 Benign neoplasm of colon, unspecified: Secondary | ICD-10-CM

## 2017-02-07 DIAGNOSIS — E119 Type 2 diabetes mellitus without complications: Secondary | ICD-10-CM | POA: Diagnosis not present

## 2017-02-07 DIAGNOSIS — N6012 Diffuse cystic mastopathy of left breast: Secondary | ICD-10-CM

## 2017-02-07 DIAGNOSIS — E1169 Type 2 diabetes mellitus with other specified complication: Secondary | ICD-10-CM

## 2017-02-07 DIAGNOSIS — E785 Hyperlipidemia, unspecified: Secondary | ICD-10-CM | POA: Diagnosis not present

## 2017-02-07 DIAGNOSIS — Z0000 Encounter for general adult medical examination without abnormal findings: Secondary | ICD-10-CM

## 2017-02-07 LAB — POCT URINALYSIS DIPSTICK
Bilirubin, UA: NEGATIVE
Glucose, UA: NEGATIVE
KETONES UA: NEGATIVE
Leukocytes, UA: NEGATIVE
Nitrite, UA: NEGATIVE
PH UA: 5 (ref 5.0–8.0)
PROTEIN UA: NEGATIVE
Urobilinogen, UA: 0.2 E.U./dL

## 2017-02-07 MED ORDER — NAPROXEN 500 MG PO TABS: 500.0000 mg | ORAL_TABLET | Freq: Two times a day (BID) | ORAL | 3 refills | Status: DC

## 2017-02-07 NOTE — Progress Notes (Signed)
Patient: Kiara Campbell, Female    DOB: August 06, 1955, 61 y.o.   MRN: [id number] Visit Date: 02/07/2017  Today's Provider: Halina Maidens, MD   Chief Complaint  Patient presents with  . Medicare Wellness    Breast Exam.    Subjective:    Annual wellness visit Kiara Campbell is a 61 y.o. female who presents today for her Subsequent Annual Wellness Visit. She feels fairly well. She reports exercising very little due to OA knees. She reports she is sleeping well. She is due for 6 mo follow up mammogram.  She is overdue for colonoscopy but does not feel she can do that now.  ----------------------------------------------------------- Hypertension  This is a chronic problem. The problem is unchanged. The problem is controlled. Pertinent negatives include no chest pain, headaches, palpitations or shortness of breath. The current treatment provides significant improvement.  Diabetes  She presents for her follow-up diabetic visit. She has type 2 diabetes mellitus. Pertinent negatives for hypoglycemia include no dizziness, headaches, nervousness/anxiousness or tremors. Pertinent negatives for diabetes include no chest pain, no fatigue, no polydipsia and no polyuria. Symptoms are stable. Current diabetic treatment includes oral agent (monotherapy). Her weight is stable. Her breakfast blood glucose is taken between 6-7 am. Her breakfast blood glucose range is generally 110-130 mg/dl. An ACE inhibitor/angiotensin II receptor blocker is being taken. Eye exam is current.  Hyperlipidemia  This is a chronic problem. Pertinent negatives include no chest pain or shortness of breath. Current antihyperlipidemic treatment includes statins.  Knee Pain   There was no injury mechanism. The pain is present in the right knee and left knee. The quality of the pain is described as aching. The pain is moderate. The pain has been worsening since onset. The symptoms are aggravated by weight bearing and movement. She has tried  NSAIDs for the symptoms. The treatment provided moderate relief.    Review of Systems  Constitutional: Negative for chills, fatigue and fever.  HENT: Negative for congestion, hearing loss, tinnitus, trouble swallowing and voice change.   Eyes: Negative for visual disturbance.  Respiratory: Negative for cough, chest tightness, shortness of breath and wheezing.   Cardiovascular: Negative for chest pain, palpitations and leg swelling.  Gastrointestinal: Negative for abdominal pain, constipation, diarrhea and vomiting.  Endocrine: Negative for polydipsia and polyuria.  Genitourinary: Negative for dysuria, frequency, genital sores, vaginal bleeding and vaginal discharge.  Musculoskeletal: Positive for arthralgias, gait problem and joint swelling.  Skin: Positive for color change (warty lesion on left hand). Negative for rash.  Neurological: Negative for dizziness, tremors, light-headedness and headaches.  Hematological: Negative for adenopathy. Does not bruise/bleed easily.  Psychiatric/Behavioral: Negative for dysphoric mood and sleep disturbance. The patient is not nervous/anxious.     Social History   Social History  . Marital status: Married    Spouse name: N/A  . Number of children: N/A  . Years of education: N/A   Occupational History  . Not on file.   Social History Main Topics  . Smoking status: Current Every Day Smoker    Packs/day: 0.50    Years: 30.00    Types: Cigarettes  . Smokeless tobacco: Current User  . Alcohol use 1.2 oz/week    2 Standard drinks or equivalent per week     Comment: occasional  . Drug use: No  . Sexual activity: Not on file   Other Topics Concern  . Not on file   Social History Narrative  . No narrative on file    Patient  Active Problem List   Diagnosis Date Noted  . Osteoarthritis of both knees 08/01/2016  . Primary osteoarthritis of both knees 08/01/2016  . Controlled type 2 diabetes mellitus without complication, without long-term  current use of insulin ([place]) 06/02/2015  . Fibrocystic breast disease 12/03/2014  . Colon polyp 12/03/2014  . Diverticulitis of colon 12/03/2014  . Essential (primary) hypertension 12/03/2014  . Herniated nucleus pulposus 12/03/2014  . OP (osteoporosis) 12/03/2014  . Hyperlipidemia associated with type 2 diabetes mellitus ([place]) 12/03/2014  . Calcium blood increased 12/23/2013    Past Surgical History:  Procedure Laterality Date  . ABDOMINAL HYSTERECTOMY     partial  . APPENDECTOMY    . BREAST CYST ASPIRATION     neg not sure which side  . TONSILLECTOMY    . VARICOSE VEIN SURGERY      Her family history includes CAD in her father; Diabetes in her maternal grandmother.     Previous Medications   ACCU-CHEK SOFTCLIX LANCETS LANCETS    Use as instructed   ALCOHOL SWABS (B-D SINGLE USE SWABS REGULAR) PADS    USE DAILY.   BLOOD GLUCOSE MONITORING SUPPL (TRUE METRIX AIR GLUCOSE METER) DEVI    1 each by Does not apply route daily.   CALCIUM CARB-CHOLECALCIFEROL (CALCIUM + D3) 600-200 MG-UNIT TABS    Take 1 tablet by mouth daily.   FLUTICASONE (FLONASE) 50 MCG/ACT NASAL SPRAY       GABAPENTIN (NEURONTIN) 100 MG CAPSULE    TAKE 1 TO 3 CAPSULES AT BEDTIME   LANCET DEVICES (ACCU-CHEK SOFTCLIX) LANCETS    1 each by Other route daily. Use as instructed   LANCETS MISC. (ACCU-CHEK SOFTCLIX LANCET DEV) KIT    1 each by Does not apply route daily.   LOSARTAN-HYDROCHLOROTHIAZIDE (HYZAAR) 100-25 MG TABLET    Take 1 tablet by mouth daily.   METFORMIN (GLUCOPHAGE-XR) 500 MG 24 HR TABLET    TAKE 1 TABLET DAILY WITH BREAKFAST   METOPROLOL SUCCINATE (TOPROL-XL) 100 MG 24 HR TABLET    TAKE 1 AND 1/2 TABLETS EVERY DAY   MUPIROCIN OINTMENT (BACTROBAN) 2 %    PLACE 1 APPLICATION INTO THE NOSE 2 TIMES A DAILY.   NAPROXEN SODIUM 220 MG CAPS    Take 1 capsule by mouth daily as needed.   SIMVASTATIN (ZOCOR) 40 MG TABLET    TAKE 1 TABLET AT BEDTIME.   TRUE METRIX BLOOD GLUCOSE TEST TEST STRIP    TEST EVERY DAY      Patient Care Team: Glean Hess, MD as PCP - General (Family Medicine)      Objective:   Vitals: BP 136/74   Pulse 70   Ht 5' 6" (1.676 m)   Wt 187 lb (84.8 kg)   LMP 09/27/1995   SpO2 96%   BMI 30.18 kg/m   Physical Exam  Constitutional: She is oriented to person, place, and time. She appears well-developed and well-nourished. No distress.  HENT:  Head: Normocephalic and atraumatic.  Right Ear: Tympanic membrane and ear canal normal.  Left Ear: Tympanic membrane and ear canal normal.  Nose: Right sinus exhibits no maxillary sinus tenderness. Left sinus exhibits no maxillary sinus tenderness.  Mouth/Throat: Uvula is midline and oropharynx is clear and moist.  Eyes: Conjunctivae and EOM are normal. Right eye exhibits no discharge. Left eye exhibits no discharge. No scleral icterus.  Neck: Normal range of motion. Carotid bruit is not present. No erythema present. No thyromegaly present.  Cardiovascular: Normal rate, regular rhythm, normal  heart sounds and normal pulses.   Pulmonary/Chest: Effort normal. No respiratory distress. She has decreased breath sounds. She has no wheezes. Right breast exhibits no mass, no nipple discharge, no skin change and no tenderness. Left breast exhibits no mass, no nipple discharge, no skin change and no tenderness.  Abdominal: Soft. Bowel sounds are normal. There is no hepatosplenomegaly. There is no tenderness. There is no CVA tenderness.  Musculoskeletal:       Right knee: She exhibits decreased range of motion and effusion.       Left knee: She exhibits decreased range of motion.  Lymphadenopathy:    She has no cervical adenopathy.    She has no axillary adenopathy.  Neurological: She is alert and oriented to person, place, and time. She has normal strength and normal reflexes. No cranial nerve deficit or sensory deficit. Gait (antalgic to right; walks with cane for support) abnormal.  Skin: Skin is warm, dry and intact. No rash noted.   Warty lesion on dorsum of left hand Multiple keratoses on back and chest  Psychiatric: She has a normal mood and affect. Her speech is normal and behavior is normal. Judgment and thought content normal. Her mood appears not anxious. Cognition and memory are normal. She does not exhibit a depressed mood.  Nursing note and vitals reviewed.   Activities of Daily Living In your present state of health, do you have any difficulty performing the following activities: 02/07/2017  Hearing? N  Vision? N  Difficulty concentrating or making decisions? N  Walking or climbing stairs? Y  Dressing or bathing? N  Doing errands, shopping? Y  Comment husband with  Preparing Food and eating ? N  Using the Toilet? N  In the past six months, have you accidently leaked urine? Y  Comment constantly  Do you have problems with loss of bowel control? Y  Comment had anal prolapse- screw is sacrum. will always have leakage,   Managing your Medications? N  Managing your Finances? N  Housekeeping or managing your Housekeeping? N  Some recent data might be hidden   Fall Risk Assessment Fall Risk  02/07/2017 01/28/2016  Falls in the past year? No No   Depression Screen PHQ 2/9 Scores 02/07/2017 01/28/2016  PHQ - 2 Score 0 0   6CIT Screen 02/07/2017  What Year? 0 points  What month? 0 points  What time? 0 points  Count back from 20 0 points  Months in reverse 0 points    Medicare Annual Wellness Visit Summary:  Reviewed patient's Family Medical History Reviewed and updated list of patient's medical providers Assessment of cognitive impairment was done Assessed patient's functional ability Established a written schedule for health screening [place] Completed and Reviewed  Exercise Activities and Dietary recommendations Goals    . Family Time          Has raised grand kids for over 12 months after loss of daughter in law and wants to keep family time important.         Immunization History  Administered Date(s) Administered  . Influenza,inj,Quad PF,36+ Mos 03/10/2015, 04/26/2016  . Pneumococcal Conjugate-13 01/28/2016  . Pneumococcal Polysaccharide-23 07/05/2008  . Tdap 07/06/2011  . Zoster 07/05/2012    Health Maintenance  Topic Date Due  . PNEUMOCOCCAL POLYSACCHARIDE VACCINE (2) 07/05/2013  . COLONOSCOPY  07/05/2013  . OPHTHALMOLOGY EXAM  10/22/2016  . FOOT EXAM  01/27/2017  . HEMOGLOBIN A1C  01/29/2017  . INFLUENZA VACCINE  04/03/2017 (Originally 02/01/2017)  .  MAMMOGRAM  12/14/2017  . TETANUS/TDAP  07/05/2021  . Hepatitis C Screening  Addressed  . HIV Screening  Addressed  . PAP SMEAR  Excluded    Discussed health benefits of physical activity, and encouraged her to engage in regular exercise appropriate for her age and condition.    ------------------------------------------------------------------------------------------------------------  Assessment & Plan:   1. Medicare annual wellness visit, subsequent Measures satisfied  2. Essential (primary) hypertension controlled - CBC with Differential/Platelet - POCT urinalysis dipstick  3. Controlled type 2 diabetes mellitus without complication, without long-term current use of insulin (HCC) Continue current therapy - Comprehensive metabolic panel - Hemoglobin A1c - TSH - Microalbumin / creatinine urine ratio  4. Hyperlipidemia associated with type 2 diabetes mellitus ([place]) On statins - Lipid panel  5. Primary osteoarthritis of both knees Continue Nsaids Consider TKR sooner rather than later - naproxen (NAPROSYN) 500 MG tablet; Take 1 tablet (500 mg total) by mouth 2 (two) times daily with a meal.  Dispense: 180 tablet; Refill: 3  6. Adenomatous polyp of colon, unspecified part of colon Patient encouraged to schedule colonoscopy within the next year  7. Fibrocystic disease of left breast Follow up mammogram scheduled   Meds ordered this encounter  Medications  .  naproxen (NAPROSYN) 500 MG tablet    Sig: Take 1 tablet (500 mg total) by mouth 2 (two) times daily with a meal.    Dispense:  180 tablet    Refill:  [place], MD [facility]  02/07/2017

## 2017-02-07 NOTE — Patient Instructions (Addendum)
Health Maintenance  Topic Date Due  . PNEUMOCOCCAL POLYSACCHARIDE VACCINE (2) 09/29/2018  . COLONOSCOPY  07/05/2013  . OPHTHALMOLOGY EXAM  10/22/2016  . FOOT EXAM  01/27/2018  . HEMOGLOBIN A1C  01/29/2017  . INFLUENZA VACCINE  04/03/2017 (Originally 02/01/2017)  . MAMMOGRAM  12/14/2017  . TETANUS/TDAP  07/05/2021  . Hepatitis C Screening  Addressed  . HIV Screening  Addressed  . PAP SMEAR  Excluded   Consult Dermatology for Skin Survey.

## 2017-02-08 LAB — COMPREHENSIVE METABOLIC PANEL
A/G RATIO: 1.7 (ref 1.2–2.2)
ALT: 24 IU/L (ref 0–32)
AST: 19 IU/L (ref 0–40)
Albumin: 4.7 g/dL (ref 3.6–4.8)
Alkaline Phosphatase: 68 IU/L (ref 39–117)
BILIRUBIN TOTAL: 0.4 mg/dL (ref 0.0–1.2)
BUN / CREAT RATIO: 19 (ref 12–28)
BUN: 15 mg/dL (ref 8–27)
CO2: 25 mmol/L (ref 20–29)
Calcium: 10.1 mg/dL (ref 8.7–10.3)
Chloride: 100 mmol/L (ref 96–106)
Creatinine, Ser: 0.8 mg/dL (ref 0.57–1.00)
GFR calc Af Amer: 91 mL/min/{1.73_m2} (ref >59)
GFR, EST NON AFRICAN AMERICAN: 79 mL/min/{1.73_m2} (ref >59)
GLOBULIN, TOTAL: 2.8 g/dL (ref 1.5–4.5)
Glucose: 115 mg/dL — ABNORMAL HIGH (ref 65–99)
POTASSIUM: 4.4 mmol/L (ref 3.5–5.2)
SODIUM: 141 mmol/L (ref 134–144)
TOTAL PROTEIN: 7.5 g/dL (ref 6.0–8.5)

## 2017-02-08 LAB — CBC WITH DIFFERENTIAL/PLATELET
Basophils Absolute: 0 10*3/uL (ref 0.0–0.2)
Basos: 0 %
EOS (ABSOLUTE): 0.1 10*3/uL (ref 0.0–0.4)
Eos: 1 %
HEMOGLOBIN: 13.3 g/dL (ref 11.1–15.9)
Hematocrit: 39.3 % (ref 34.0–46.6)
Immature Grans (Abs): 0 10*3/uL (ref 0.0–0.1)
Immature Granulocytes: 0 %
LYMPHS ABS: 2.4 10*3/uL (ref 0.7–3.1)
Lymphs: 29 %
MCH: 32.4 pg (ref 26.6–33.0)
MCHC: 33.8 g/dL (ref 31.5–35.7)
MCV: 96 fL (ref 79–97)
MONOCYTES: 27 %
Monocytes Absolute: 2.2 10*3/uL — ABNORMAL HIGH (ref 0.1–0.9)
NEUTROS ABS: 3.6 10*3/uL (ref 1.4–7.0)
Neutrophils: 43 %
PLATELETS: 279 10*3/uL (ref 150–379)
RBC: 4.11 x10E6/uL (ref 3.77–5.28)
RDW: 15.6 % — ABNORMAL HIGH (ref 12.3–15.4)
WBC: 8.4 10*3/uL (ref 3.4–10.8)

## 2017-02-08 LAB — LIPID PANEL
CHOL/HDL RATIO: 2.9 ratio (ref 0.0–4.4)
CHOLESTEROL TOTAL: 112 mg/dL (ref 100–199)
HDL: 38 mg/dL — ABNORMAL LOW (ref >39)
LDL CALC: 49 mg/dL (ref 0–99)
Triglycerides: 123 mg/dL (ref 0–149)
VLDL CHOLESTEROL CAL: 25 mg/dL (ref 5–40)

## 2017-02-08 LAB — HEMOGLOBIN A1C
Est. average glucose Bld gHb Est-mCnc: 134 mg/dL
Hgb A1c MFr Bld: 6.3 % — ABNORMAL HIGH (ref 4.8–5.6)

## 2017-02-08 LAB — TSH: TSH: 2.48 u[IU]/mL (ref 0.450–4.500)

## 2017-02-09 LAB — MICROALBUMIN / CREATININE URINE RATIO
CREATININE, UR: 86.4 mg/dL
MICROALB/CREAT RATIO: 79.9 mg/g{creat} — AB (ref 0.0–30.0)
MICROALBUM., U, RANDOM: 69 ug/mL

## 2017-02-16 ENCOUNTER — Ambulatory Visit
Admission: EM | Admit: 2017-02-16 | Discharge: 2017-02-16 | Disposition: A | Discharge status: Home / Self Care | Payer: Medicare PPO | Attending: Physician Assistant | Admitting: Physician Assistant

## 2017-02-16 DIAGNOSIS — K921 Melena: Secondary | ICD-10-CM

## 2017-02-16 DIAGNOSIS — Z791 Long term (current) use of non-steroidal anti-inflammatories (NSAID): Secondary | ICD-10-CM | POA: Diagnosis not present

## 2017-02-16 DIAGNOSIS — K5732 Diverticulitis of large intestine without perforation or abscess without bleeding: Secondary | ICD-10-CM

## 2017-02-16 DIAGNOSIS — K5731 Diverticulosis of large intestine without perforation or abscess with bleeding: Secondary | ICD-10-CM

## 2017-02-16 LAB — CBC WITH DIFFERENTIAL/PLATELET
BAND NEUTROPHILS: 3 %
BASOS PCT: 1 %
BLASTS: 0 %
Basophils Absolute: 0.1 10*3/uL (ref 0–0.1)
EOS ABS: 0.2 10*3/uL (ref 0–0.7)
Eosinophils Relative: 2 %
HCT: 35.1 % (ref 35.0–47.0)
Hemoglobin: 12.2 g/dL (ref 12.0–16.0)
Lymphocytes Relative: 20 %
Lymphs Abs: 2 10*3/uL (ref 1.0–3.6)
MCH: 33.4 pg (ref 26.0–34.0)
MCHC: 34.8 g/dL (ref 32.0–36.0)
MCV: 96.1 fL (ref 80.0–100.0)
MONO ABS: 1.8 10*3/uL — AB (ref 0.2–0.9)
MONOS PCT: 18 %
Metamyelocytes Relative: 1 %
Myelocytes: 0 %
NEUTROS ABS: 5.7 10*3/uL (ref 1.4–6.5)
Neutrophils Relative %: 55 %
OTHER: 0 %
PLATELETS: 290 10*3/uL (ref 150–440)
PROMYELOCYTES ABS: 0 %
RBC: 3.65 MIL/uL — ABNORMAL LOW (ref 3.80–5.20)
RDW: 15.6 % — AB (ref 11.5–14.5)
WBC: 9.8 10*3/uL (ref 3.6–11.0)
nRBC: 0 /100 WBC

## 2017-02-16 MED ORDER — OMEPRAZOLE 20 MG PO CPDR: 20.0000 mg | DELAYED_RELEASE_CAPSULE | Freq: Two times a day (BID) | ORAL | 0 refills | Status: DC

## 2017-02-16 MED ORDER — CIPROFLOXACIN HCL 500 MG PO TABS: 500.0000 mg | ORAL_TABLET | Freq: Two times a day (BID) | ORAL | 0 refills | Status: DC

## 2017-02-16 MED ORDER — METRONIDAZOLE 500 MG PO TABS: 500.0000 mg | ORAL_TABLET | Freq: Two times a day (BID) | ORAL | 0 refills | Status: AC

## 2017-02-16 NOTE — Discharge Instructions (Signed)
-  Cipro: 1 tablet twice a day for 7 days -Flagyl: 1 tablet twice a day for 7 days -Omeprazole: 1 tablet twice a day. To continue while concurrently using high-dose NSAIDs -Would report to the ER should she have no improvement within 2 days or if your symptoms worsen or you continue to have rectal bleeding. -If you do not require hospitalization, recommend follow-up with her primary care provider in 3-4 days.

## 2017-02-16 NOTE — ED Provider Notes (Signed)
[facility]    CSN: [id number] Arrival date & time: 02/16/17  1618   History   Chief Complaint Chief Complaint  Patient presents with  . Diverticulitis    HPI Kiara Campbell is a 61 y.o. female.   Patient is a 61 year old female with past medical history of  a cyst diverticulosis with a reported 4 attacks of diverticulitis in the last 10 years. Patient reports her last episode of diverticulitis about 3 years ago which required her to be hospitalized and received IV antibiotics. Patient states she was told that if she had possibly caught it sooner and gotten oral antibiotics that she might have not needed the admission. Thinking this may be a another bout of diverticulitis, she has presented to hopefully catch it early and avoid a admission. Patient reports bright red blood per rectum this morning both with and without stool. Patient states it did not seem to be mixed in with any stooling. Patient denies any nausea or vomiting and denies any diarrhea. Patient denies any dark black or tarry stools and also denies any coffee-ground emesis. Patient reports feeling weak and states that she has been belching and passing gas. The symptoms are similar to her previous presentations of diverticulitis. Of note, patient does take Naprosyn 500 mg twice a day for her knee pain. Patient states she has been on the 500 mg twice a day for 3 or 4 days but had previously been taking over-the-counter 440 mg twice a day. She states that she has not taken any protective proton pump inhibitors.   On exam of the patient's CareEverywhere chart, she has had a failure of Cipro and Flagyl combination but it was noted that she had had several days of symptoms before starting the regimen at that time.      History reviewed. No pertinent past medical history.  Patient Active Problem List   Diagnosis Date Noted  . Primary osteoarthritis of both knees 08/01/2016  . Controlled type 2 diabetes mellitus without  complication, without long-term current use of insulin ([place]) 06/02/2015  . Fibrocystic breast disease 12/03/2014  . Colon polyp 12/03/2014  . Diverticulitis of colon 12/03/2014  . Essential (primary) hypertension 12/03/2014  . Herniated nucleus pulposus 12/03/2014  . OP (osteoporosis) 12/03/2014  . Hyperlipidemia associated with type 2 diabetes mellitus ([place]) 12/03/2014  . Calcium blood increased 12/23/2013    Past Surgical History:  Procedure Laterality Date  . ABDOMINAL HYSTERECTOMY     partial  . APPENDECTOMY    . BREAST CYST ASPIRATION     neg not sure which side  . TONSILLECTOMY    . VARICOSE VEIN SURGERY      OB History    No data available       Home Medications    Prior to Admission medications   Medication Sig Start Date End Date Taking? Authorizing Provider  ACCU-CHEK SOFTCLIX LANCETS lancets Use as instructed 09/21/15  Yes Glean Hess, MD  Alcohol Swabs (B-D SINGLE USE SWABS REGULAR) PADS USE DAILY. 08/16/16  Yes Glean Hess, MD  Blood Glucose Monitoring Suppl (TRUE METRIX AIR GLUCOSE METER) DEVI 1 each by Does not apply route daily. 07/29/15  Yes Glean Hess, MD  Calcium Carb-Cholecalciferol (CALCIUM + D3) 600-200 MG-UNIT TABS Take 1 tablet by mouth daily.   Yes [provider]  fluticasone Asencion Islam) 50 MCG/ACT nasal spray  02/12/15  Yes [provider]  gabapentin (NEURONTIN) 100 MG capsule TAKE 1 TO 3 CAPSULES AT BEDTIME 12/27/15  Yes Berglund, Laura H, MD  Lancet Devices (ACCU-CHEK SOFTCLIX) lancets 1 each by Other route daily. Use as instructed 03/12/15  Yes Berglund, Laura H, MD  Lancets Misc. (ACCU-CHEK SOFTCLIX LANCET DEV) KIT 1 each by Does not apply route daily. 09/21/15  Yes Berglund, Laura H, MD  losartan-hydrochlorothiazide (HYZAAR) 100-25 MG tablet Take 1 tablet by mouth daily. 08/01/16  Yes Berglund, Laura H, MD  metFORMIN (GLUCOPHAGE-XR) 500 MG 24 hr tablet TAKE 1 TABLET DAILY WITH BREAKFAST 08/08/16  Yes Berglund, Laura H,  MD  metoprolol succinate (TOPROL-XL) 100 MG 24 hr tablet TAKE 1 AND 1/2 TABLETS EVERY DAY 08/08/16  Yes Berglund, Laura H, MD  mupirocin ointment (BACTROBAN) 2 % PLACE 1 APPLICATION INTO THE NOSE 2 TIMES A DAILY. 10/20/15  Yes Berglund, Laura H, MD  naproxen (NAPROSYN) 500 MG tablet Take 1 tablet (500 mg total) by mouth 2 (two) times daily with a meal. 02/07/17  Yes Berglund, Laura H, MD  simvastatin (ZOCOR) 40 MG tablet TAKE 1 TABLET AT BEDTIME. 08/08/16  Yes Berglund, Laura H, MD  TRUE METRIX BLOOD GLUCOSE TEST test strip TEST EVERY DAY 11/07/16  Yes Berglund, Laura H, MD  ciprofloxacin (CIPRO) 500 MG tablet Take 1 tablet (500 mg total) by mouth 2 (two) times daily. 02/16/17 02/23/17  Harris, Michael D, PA-C  metroNIDAZOLE (FLAGYL) 500 MG tablet Take 1 tablet (500 mg total) by mouth 2 (two) times daily. 02/16/17 02/23/17  Harris, Michael D, PA-C  omeprazole (PRILOSEC) 20 MG capsule Take 1 capsule (20 mg total) by mouth 2 (two) times daily before a meal. 02/16/17   Harris, Michael D, PA-C    Family History Family History  Problem Relation Age of Onset  . CAD Father        died age 63  . Diabetes Maternal Grandmother   . Breast cancer Neg Hx     Social History Social History  Substance Use Topics  . Smoking status: Current Every Day Smoker    Packs/day: 0.50    Years: 30.00    Types: Cigarettes  . Smokeless tobacco: Current User  . Alcohol use 1.2 oz/week    2 Standard drinks or equivalent per week     Comment: occasional     Allergies   Glipizide   Review of Systems Review of Systems   Physical Exam Triage Vital Signs ED Triage Vitals  Enc Vitals Group     BP 02/16/17 1634 136/64     Pulse Rate 02/16/17 1634 79     Resp 02/16/17 1634 16     Temp 02/16/17 1634 98.3 F (36.8 C)     Temp Source 02/16/17 1634 Oral     SpO2 02/16/17 1634 97 %     Weight 02/16/17 1638 187 lb (84.8 kg)     Height 02/16/17 1638 5' 6" (1.676 m)     Head Circumference --      Peak Flow --      Pain  Score --      Pain Loc --      Pain Edu? --      Excl. in GC? --    No data found.   Updated Vital Signs BP 136/64 (BP Location: Left Arm)   Pulse 79   Temp 98.3 F (36.8 C) (Oral)   Resp 16   Ht 5' 6" (1.676 m)   Wt 187 lb (84.8 kg)   LMP 09/27/1995   SpO2 97%   BMI 30.18 kg/m   Visual Acuity   Right Eye Distance:   Left Eye Distance:   Bilateral Distance:    Right Eye Near:   Left Eye Near:    Bilateral Near:     Physical Exam  Constitutional: She is oriented to person, place, and time. She appears well-developed and well-nourished. No distress.  HENT:  Head: Normocephalic and atraumatic.  Eyes: Pupils are equal, round, and reactive to light. EOM are normal.  Neck: Normal range of motion. Neck supple.  Cardiovascular: Normal rate, regular rhythm and normal heart sounds.  Exam reveals no friction rub.   No murmur heard. Pulmonary/Chest: Effort normal and breath sounds normal. No respiratory distress. She has no wheezes.  Abdominal: Soft. Bowel sounds are normal. She exhibits no mass. There is tenderness (mild tenderness to palpation of the lower epigastric). There is no rebound and no guarding.  Musculoskeletal: Normal range of motion. She exhibits no edema.  Neurological: She is alert and oriented to person, place, and time. No cranial nerve deficit.  Skin: Skin is warm and dry. Capillary refill takes less than 2 seconds. No pallor.  Psychiatric: She has a normal mood and affect.     UC Treatments / Results  Labs (all labs ordered are listed, but only abnormal results are displayed) Labs Reviewed  CBC WITH DIFFERENTIAL/PLATELET - Abnormal; Notable for the following:       Result Value   RBC 3.65 (*)    RDW 15.6 (*)    All other components within normal limits    EKG  EKG Interpretation None       Radiology No results found.  Procedures Procedures (including critical care time)  Medications Ordered in UC Medications - No data to  display   Initial Impression / Assessment and Plan / UC Course  I have reviewed the triage vital signs and the nursing notes.  Pertinent labs & imaging results that were available during my care of the patient were reviewed by me and considered in my medical decision making (see chart for details).    Pt with recurrent diverticulitis with mild lower abdominal tenderness and hematochezia. These are symptoms similar to her previous presentations. Pt also with long term antibiotic use without PPI.  Final Clinical Impressions(s) / UC Diagnoses   Final diagnoses:  Diverticulosis of colon with hemorrhage  Hematochezia  Diverticulitis of colon  Long term current use of non-steroidal anti-inflammatories (NSAID)    New Prescriptions New Prescriptions   CIPROFLOXACIN (CIPRO) 500 MG TABLET    Take 1 tablet (500 mg total) by mouth 2 (two) times daily.   METRONIDAZOLE (FLAGYL) 500 MG TABLET    Take 1 tablet (500 mg total) by mouth 2 (two) times daily.   OMEPRAZOLE (PRILOSEC) 20 MG CAPSULE    Take 1 capsule (20 mg total) by mouth 2 (two) times daily before a meal.    Controlled Substance Prescriptions [place] Controlled Substance Registry consulted? Not Applicable   Patient presents with lower abdominal pain and hematochezia, symptoms similar to her previous episodes of diverticulitis. Patient did have a failure of outpatient Cipro and Flagyl with her last episode of diverticulitis per the admission history and physical but it was mentioned that the oral antibiotics were started several days after her symptoms began as she was unable to get in for an appointment. Patient does take naproxen sodium twice daily for her arthritis which it sounds like she has done for a while without a protective PPI. We'll go ahead and prescribe her omeprazole twice daily. CBC with WBC of  9.8. Hemoglobin of 12.12 down from 13.3 on 02/07/17 and 35.1 down from 39.3 on 02/07/17. We'll go ahead and try a oral course of Cipro and  Flagyl for 7 days. Patient will have a low threshold to present to the ER should she not felt any improvement over the next day or so or should she continued to have bleeding. Patient should also follow-up with her primary care provider in about 3 days. Patient verbalized understanding and is agreeable with the plan   Luvenia Redden, PA-C    Luvenia Redden, PA-C 02/16/17 1739

## 2017-02-16 NOTE — ED Triage Notes (Signed)
61 year old Caucasian female is here today with complaints of Chronic diverticulitis that started this morning. She states her PCP advised her to be evaluated at an Urgent Care. She states her stool this morning had a lot of  bright red blood in it. She states she's also having a lot of belching and gas. She denies any abdominal pain, nausea, diarrhea and vomiting.

## 2017-03-08 DIAGNOSIS — M25561 Pain in right knee: Secondary | ICD-10-CM | POA: Diagnosis not present

## 2017-03-08 DIAGNOSIS — M1711 Unilateral primary osteoarthritis, right knee: Secondary | ICD-10-CM | POA: Diagnosis not present

## 2017-03-08 DIAGNOSIS — M25562 Pain in left knee: Secondary | ICD-10-CM | POA: Diagnosis not present

## 2017-03-08 DIAGNOSIS — M1712 Unilateral primary osteoarthritis, left knee: Secondary | ICD-10-CM | POA: Diagnosis not present

## 2017-03-08 DIAGNOSIS — F17210 Nicotine dependence, cigarettes, uncomplicated: Secondary | ICD-10-CM | POA: Diagnosis not present

## 2017-04-23 DIAGNOSIS — F17210 Nicotine dependence, cigarettes, uncomplicated: Secondary | ICD-10-CM | POA: Diagnosis not present

## 2017-04-23 DIAGNOSIS — M170 Bilateral primary osteoarthritis of knee: Secondary | ICD-10-CM | POA: Diagnosis not present

## 2017-04-23 DIAGNOSIS — R109 Unspecified abdominal pain: Secondary | ICD-10-CM | POA: Diagnosis not present

## 2017-04-23 DIAGNOSIS — K5792 Diverticulitis of intestine, part unspecified, without perforation or abscess without bleeding: Secondary | ICD-10-CM | POA: Diagnosis not present

## 2017-04-23 DIAGNOSIS — D62 Acute posthemorrhagic anemia: Secondary | ICD-10-CM | POA: Diagnosis not present

## 2017-04-23 DIAGNOSIS — I10 Essential (primary) hypertension: Secondary | ICD-10-CM | POA: Diagnosis not present

## 2017-04-23 DIAGNOSIS — K5733 Diverticulitis of large intestine without perforation or abscess with bleeding: Secondary | ICD-10-CM | POA: Diagnosis not present

## 2017-04-23 DIAGNOSIS — K921 Melena: Secondary | ICD-10-CM | POA: Diagnosis not present

## 2017-04-23 DIAGNOSIS — R42 Dizziness and giddiness: Secondary | ICD-10-CM | POA: Diagnosis not present

## 2017-04-23 DIAGNOSIS — Z791 Long term (current) use of non-steroidal anti-inflammatories (NSAID): Secondary | ICD-10-CM | POA: Diagnosis not present

## 2017-04-23 DIAGNOSIS — E785 Hyperlipidemia, unspecified: Secondary | ICD-10-CM | POA: Diagnosis not present

## 2017-04-23 DIAGNOSIS — Z7984 Long term (current) use of oral hypoglycemic drugs: Secondary | ICD-10-CM | POA: Diagnosis not present

## 2017-04-23 DIAGNOSIS — R1031 Right lower quadrant pain: Secondary | ICD-10-CM | POA: Diagnosis not present

## 2017-04-23 DIAGNOSIS — E119 Type 2 diabetes mellitus without complications: Secondary | ICD-10-CM | POA: Diagnosis not present

## 2017-05-02 ENCOUNTER — Other Ambulatory Visit: Payer: Self-pay | Admitting: Internal Medicine

## 2017-05-03 ENCOUNTER — Ambulatory Visit (INDEPENDENT_AMBULATORY_CARE_PROVIDER_SITE_OTHER): Payer: Medicare PPO | Admitting: Internal Medicine

## 2017-05-03 ENCOUNTER — Encounter: Payer: Self-pay | Admitting: Internal Medicine

## 2017-05-03 VITALS — BP 118/68 | HR 77 | Ht 66.0 in | Wt 188.0 lb

## 2017-05-03 DIAGNOSIS — N760 Acute vaginitis: Secondary | ICD-10-CM | POA: Diagnosis not present

## 2017-05-03 DIAGNOSIS — K922 Gastrointestinal hemorrhage, unspecified: Secondary | ICD-10-CM | POA: Diagnosis not present

## 2017-05-03 DIAGNOSIS — I10 Essential (primary) hypertension: Secondary | ICD-10-CM | POA: Diagnosis not present

## 2017-05-03 DIAGNOSIS — N6012 Diffuse cystic mastopathy of left breast: Secondary | ICD-10-CM

## 2017-05-03 DIAGNOSIS — M170 Bilateral primary osteoarthritis of knee: Secondary | ICD-10-CM

## 2017-05-03 DIAGNOSIS — K5732 Diverticulitis of large intestine without perforation or abscess without bleeding: Secondary | ICD-10-CM | POA: Diagnosis not present

## 2017-05-03 MED ORDER — TRAMADOL HCL 50 MG PO TABS: 50.0000 mg | ORAL_TABLET | Freq: Two times a day (BID) | ORAL | 0 refills | Status: DC

## 2017-05-03 MED ORDER — OMEPRAZOLE 20 MG PO CPDR
20.0000 mg | DELAYED_RELEASE_CAPSULE | Freq: Two times a day (BID) | ORAL | 1 refills | Status: DC
Start: 2017-05-03 — End: 2017-09-19

## 2017-05-03 MED ORDER — FLUCONAZOLE 100 MG PO TABS: 100.0000 mg | ORAL_TABLET | Freq: Every day | ORAL | 0 refills | Status: DC

## 2017-05-03 NOTE — Patient Instructions (Signed)
Take Tramadol twice a day as needed - alternate with tylenol

## 2017-05-03 NOTE — Progress Notes (Signed)
Date:  05/03/2017   Name:  Kiara Campbell   DOB:  11-27-1955   MRN:  [id number]   Chief Complaint: Hospitalization Follow-up (Feels weak and feels like may have yeast infection from antibiotics. Finished antibiotics for cirpo and flagyl. ) and Hypertension (Told to stop lisinipril/hctz til see pcp. )  Admitted to [facility] 04/23/17 through 04/25/17 for acute diverticulits and hematochezia with acute blood loss anemia.  CT was done that showed sigmoid diverticulits and diverticulosis.  Hgb was 7.0 - she was tranfused with 1 u PRBCs with hgb up to 8.7.  It was felt that she also had a UGI bleed due to NSAID use.  Naproxen was discontinued and she was started on bid PPI.  Discharged on cipro and flagyl. Lisinopril was also held due to low BP.  Metoprolol was continued. She still feels weak and stools are still black.   Review of Systems  Constitutional: Positive for fatigue. Negative for chills and fever.  Respiratory: Negative for chest tightness, shortness of breath and wheezing.   Cardiovascular: Negative for chest pain and palpitations.  Gastrointestinal: Positive for abdominal pain (intermittent LLQ discomfort cramps). Negative for blood in stool (but stools black) and rectal pain.  Genitourinary: Positive for vaginal discharge (itching). Negative for dysuria.  Musculoskeletal: Positive for gait problem and joint swelling. Arthralgias: severe knee pain not relieved by tylenol.  Has TKR planned for January.  Skin: Negative for rash.  Neurological: Negative for dizziness, syncope and headaches.  Hematological: Negative for adenopathy.    Patient Active Problem List   Diagnosis Date Noted  . Primary osteoarthritis of both knees 08/01/2016  . Controlled type 2 diabetes mellitus without complication, without long-term current use of insulin ([place]) 06/02/2015  . Fibrocystic breast disease 12/03/2014  . Colon polyp 12/03/2014  . Diverticulitis of colon 12/03/2014  . Essential (primary)  hypertension 12/03/2014  . Herniated nucleus pulposus 12/03/2014  . OP (osteoporosis) 12/03/2014  . Hyperlipidemia associated with type 2 diabetes mellitus ([place]) 12/03/2014  . Calcium blood increased 12/23/2013    Prior to Admission medications   Medication Sig Start Date End Date Taking? Authorizing Provider  ACCU-CHEK SOFTCLIX LANCETS lancets Use as instructed 09/21/15  Yes Glean Hess, MD  Alcohol Swabs (B-D SINGLE USE SWABS REGULAR) PADS USE DAILY. 08/16/16  Yes Glean Hess, MD  Blood Glucose Monitoring Suppl (TRUE METRIX AIR GLUCOSE METER) DEVI 1 each by Does not apply route daily. 07/29/15  Yes Glean Hess, MD  Calcium Carb-Cholecalciferol (CALCIUM + D3) 600-200 MG-UNIT TABS Take 1 tablet by mouth daily.   Yes [provider]  fluticasone Asencion Islam) 50 MCG/ACT nasal spray  02/12/15  Yes [provider]  gabapentin (NEURONTIN) 100 MG capsule TAKE 1 TO 3 CAPSULES AT BEDTIME 12/27/15  Yes Glean Hess, MD  Lancet Devices [facility]) lancets 1 each by Other route daily. Use as instructed 03/12/15  Yes Glean Hess, MD  Lancets Misc. (ACCU-CHEK SOFTCLIX LANCET DEV) KIT 1 each by Does not apply route daily. 09/21/15  Yes Glean Hess, MD  metFORMIN (GLUCOPHAGE-XR) 500 MG 24 hr tablet TAKE 1 TABLET DAILY WITH BREAKFAST 08/08/16  Yes Glean Hess, MD  metoprolol succinate (TOPROL-XL) 100 MG 24 hr tablet TAKE 1 AND 1/2 TABLETS EVERY DAY 08/08/16  Yes Glean Hess, MD  mupirocin ointment (BACTROBAN) 2 % PLACE 1 APPLICATION INTO THE NOSE 2 TIMES A DAILY. 10/20/15  Yes Glean Hess, MD  omeprazole (PRILOSEC) 20 MG capsule Take  1 capsule (20 mg total) by mouth 2 (two) times daily before a meal. 02/16/17  Yes Luvenia Redden, PA-C  simvastatin (ZOCOR) 40 MG tablet TAKE 1 TABLET AT BEDTIME. 08/08/16  Yes Glean Hess, MD  TRUE METRIX BLOOD GLUCOSE TEST test strip TEST EVERY DAY 11/07/16  Yes Glean Hess, MD    losartan-hydrochlorothiazide (HYZAAR) 100-25 MG tablet Take 1 tablet by mouth daily. Patient not taking: Reported on 05/03/2017 08/01/16   Glean Hess, MD    Allergies  Allergen Reactions  . Glipizide Nausea Only    Past Surgical History:  Procedure Laterality Date  . ABDOMINAL HYSTERECTOMY     partial  . APPENDECTOMY    . BREAST CYST ASPIRATION     neg not sure which side  . TONSILLECTOMY    . VARICOSE VEIN SURGERY      Social History  Substance Use Topics  . Smoking status: Current Every Day Smoker    Packs/day: 0.50    Years: 30.00    Types: Cigarettes  . Smokeless tobacco: Current User  . Alcohol use 1.2 oz/week    2 Standard drinks or equivalent per week     Comment: occasional     Medication list has been reviewed and updated.  PHQ 2/9 Scores 02/07/2017 01/28/2016  PHQ - 2 Score 0 0    Physical Exam  Constitutional: She is oriented to person, place, and time. She appears well-developed and well-nourished. No distress.  HENT:  Head: Normocephalic and atraumatic.  Eyes: Pupils are equal, round, and reactive to light.  Neck: Normal range of motion. Neck supple. No thyromegaly present.  Cardiovascular: Normal rate, regular rhythm and normal heart sounds.   Pulmonary/Chest: Effort normal. No respiratory distress. She has decreased breath sounds. She has no wheezes.  Abdominal: Soft. Bowel sounds are normal. She exhibits no distension and no mass. There is no tenderness.  Musculoskeletal: She exhibits no edema or tenderness.       Right knee: She exhibits decreased range of motion, swelling and effusion.  Neurological: She is alert and oriented to person, place, and time.  Skin: Skin is warm and dry. No rash noted.  Psychiatric: She has a normal mood and affect. Her behavior is normal. Thought content normal.  Nursing note and vitals reviewed.   BP 118/68   Pulse 77   Ht _0  (1.676 m)   Wt 188 lb (85.3 kg)   LMP 09/27/1995   SpO2 98%   BMI 30.34  kg/m   Assessment and Plan: 1. Diverticulitis of colon Finished antibiotic Likely needs colonoscopy - Ambulatory referral to Gastroenterology  2. Essential (primary) hypertension Continue metoprolol, remain off lisinopril for now - Comprehensive metabolic panel  3. UGI bleed Continue bid omeprazole Refer for EGD - CBC with Differential/Platelet - Ambulatory referral to Gastroenterology  4. Acute vaginitis - fluconazole (DIFLUCAN) 100 MG tablet; Take 1 tablet (100 mg total) by mouth daily.  Dispense: 1 tablet; Refill: 0  5. Primary osteoarthritis of both knees Alternate tramadol with tylenol since off nsaids - traMADol (ULTRAM) 50 MG tablet; Take 1 tablet (50 mg total) by mouth 2 (two) times daily.  Dispense: 60 tablet; Refill: 0  6. Fibrocystic disease of left breast - MM DIGITAL SCREENING BILATERAL; Future - US BREAST LTD UNI LEFT INC AXILLA; Future   Meds ordered this encounter  Medications  . omeprazole (PRILOSEC) 20 MG capsule    Sig: Take 1 capsule (20 mg total) by mouth 2 (two) times daily  before a meal.    Dispense:  180 capsule    Refill:  1  . fluconazole (DIFLUCAN) 100 MG tablet    Sig: Take 1 tablet (100 mg total) by mouth daily.    Dispense:  1 tablet    Refill:  0  . traMADol (ULTRAM) 50 MG tablet    Sig: Take 1 tablet (50 mg total) by mouth 2 (two) times daily.    Dispense:  60 tablet    Refill:  0    Partially dictated using Editor, commissioning. Any errors are unintentional.  Halina Maidens, MD [facility]  05/03/2017

## 2017-05-04 LAB — COMPREHENSIVE METABOLIC PANEL
A/G RATIO: 1.7 (ref 1.2–2.2)
ALT: 26 IU/L (ref 0–32)
AST: 24 IU/L (ref 0–40)
Albumin: 4.3 g/dL (ref 3.6–4.8)
Alkaline Phosphatase: 53 IU/L (ref 39–117)
BUN / CREAT RATIO: 11 — AB (ref 12–28)
BUN: 8 mg/dL (ref 8–27)
Bilirubin Total: <0.2 mg/dL (ref 0.0–1.2)
CALCIUM: 9.3 mg/dL (ref 8.7–10.3)
CO2: 23 mmol/L (ref 20–29)
Chloride: 104 mmol/L (ref 96–106)
Creatinine, Ser: 0.7 mg/dL (ref 0.57–1.00)
GFR, EST AFRICAN AMERICAN: 107 mL/min/{1.73_m2} (ref >59)
GFR, EST NON AFRICAN AMERICAN: 93 mL/min/{1.73_m2} (ref >59)
GLOBULIN, TOTAL: 2.5 g/dL (ref 1.5–4.5)
Glucose: 109 mg/dL — ABNORMAL HIGH (ref 65–99)
POTASSIUM: 4.7 mmol/L (ref 3.5–5.2)
Sodium: 144 mmol/L (ref 134–144)
Total Protein: 6.8 g/dL (ref 6.0–8.5)

## 2017-05-04 LAB — CBC WITH DIFFERENTIAL/PLATELET
BASOS: 0 %
Basophils Absolute: 0 10*3/uL (ref 0.0–0.2)
EOS (ABSOLUTE): 0.1 10*3/uL (ref 0.0–0.4)
EOS: 1 %
HEMATOCRIT: 28.6 % — AB (ref 34.0–46.6)
HEMOGLOBIN: 9.1 g/dL — AB (ref 11.1–15.9)
IMMATURE GRANS (ABS): 0 10*3/uL (ref 0.0–0.1)
Immature Granulocytes: 1 %
LYMPHS ABS: 1.9 10*3/uL (ref 0.7–3.1)
LYMPHS: 22 %
MCH: 30.6 pg (ref 26.6–33.0)
MCHC: 31.8 g/dL (ref 31.5–35.7)
MCV: 96 fL (ref 79–97)
MONOCYTES: 26 %
Monocytes Absolute: 2.3 10*3/uL — ABNORMAL HIGH (ref 0.1–0.9)
NEUTROS ABS: 4.4 10*3/uL (ref 1.4–7.0)
Neutrophils: 50 %
PLATELETS: 392 10*3/uL — AB (ref 150–379)
RBC: 2.97 x10E6/uL — ABNORMAL LOW (ref 3.77–5.28)
RDW: 15.9 % — ABNORMAL HIGH (ref 12.3–15.4)
WBC: 8.8 10*3/uL (ref 3.4–10.8)

## 2017-05-09 ENCOUNTER — Other Ambulatory Visit: Payer: Self-pay | Admitting: Internal Medicine

## 2017-05-09 DIAGNOSIS — M170 Bilateral primary osteoarthritis of knee: Secondary | ICD-10-CM

## 2017-05-10 ENCOUNTER — Other Ambulatory Visit: Payer: Self-pay | Admitting: Internal Medicine

## 2017-05-10 DIAGNOSIS — M170 Bilateral primary osteoarthritis of knee: Secondary | ICD-10-CM

## 2017-05-11 ENCOUNTER — Other Ambulatory Visit: Payer: Self-pay | Admitting: Internal Medicine

## 2017-05-11 DIAGNOSIS — M170 Bilateral primary osteoarthritis of knee: Secondary | ICD-10-CM

## 2017-05-17 ENCOUNTER — Ambulatory Visit: Payer: Medicare PPO | Admitting: Gastroenterology

## 2017-05-17 ENCOUNTER — Encounter: Payer: Self-pay | Admitting: Gastroenterology

## 2017-05-17 VITALS — BP 146/80 | HR 69 | Temp 97.6°F | Ht 66.0 in | Wt 183.0 lb

## 2017-05-17 DIAGNOSIS — K5792 Diverticulitis of intestine, part unspecified, without perforation or abscess without bleeding: Secondary | ICD-10-CM | POA: Diagnosis not present

## 2017-05-17 NOTE — Patient Instructions (Addendum)
Take Miralax every day over the counter to maintain 1-2 soft Bowel movements daily. Take Omeprazole as prescribed Call us or come to the ER with any signs of blood in stool, worsening abdominal pain, fever/chills or any other reason for concern

## 2017-05-17 NOTE — Progress Notes (Signed)
Kiara Antigua, MD [address], [place], [place], [place], [address] [address], [place], [place], [place], [address] Phone: [phone]  Fax: [phone]  Consultation  Referring Provider:     Glean Hess, MD Primary Care Physician:  Glean Hess, MD Primary Gastroenterologist:  Virgel Manifold, MD        Reason for REFERAL:     diverticulitis  Date of Consultation:  05/17/2017         HPI:   Kiara Campbell is a 61 y.o. Campbell recently admitted to [facility] for uncomplicated sigmoid diverticulitis and discharged on Oct 23rd here for evaluation for colonoscopy. She went to the [facility] ER with hematochezia and developed RLQ pain while at the hospital when a CT lead to the above diagnosis. She was also on naproxen and was placed on PPI due to her NSAID use and possiblity of the hematochezia being from PUD. No endoscopies done during the admission. Stool is now brown in color with no melena or hematochezia. Last colonoscopy was in 2005, reportedly normal, report not available. Pt. States she had diverticulitis at that time time. No dysphagia or weight loss. Is not taking Naproxen now.   History reviewed. No pertinent past medical history. HTN  Past Surgical History:  Procedure Laterality Date  . ABDOMINAL HYSTERECTOMY     partial  . APPENDECTOMY    . BREAST CYST ASPIRATION     neg not sure which side  . TONSILLECTOMY    . VARICOSE VEIN SURGERY      Prior to Admission medications   Medication Sig Start Date End Date Taking? Authorizing Provider  ACCU-CHEK SOFTCLIX LANCETS lancets Use as instructed 09/21/15  Yes Glean Hess, MD  Alcohol Swabs (B-D SINGLE USE SWABS REGULAR) PADS USE DAILY. 08/16/16  Yes Glean Hess, MD  Blood Glucose Monitoring Suppl (TRUE METRIX AIR GLUCOSE METER) DEVI 1 each by Does not apply route daily. 07/29/15  Yes Glean Hess, MD  Calcium Carb-Cholecalciferol (CALCIUM + D3) 600-200 MG-UNIT TABS Take 1 tablet by mouth daily.    Yes [provider]  Calcium Carb-Ergocalciferol 500-200 MG-UNIT TABS Take by mouth.   Yes [provider]  diclofenac sodium (VOLTAREN) 1 % GEL Apply topically. 04/25/17 04/25/18 Yes [provider]  fluconazole (DIFLUCAN) 100 MG tablet Take 1 tablet (100 mg total) by mouth daily. 05/03/17  Yes Glean Hess, MD  fluticasone Asencion Islam) 50 MCG/ACT nasal spray  02/12/15  Yes [provider]  gabapentin (NEURONTIN) 100 MG capsule TAKE 1 TO 3 CAPSULES AT BEDTIME 12/27/15  Yes Glean Hess, MD  Lancet Devices [facility]) lancets 1 each by Other route daily. Use as instructed 03/12/15  Yes Glean Hess, MD  Lancets Misc. (ACCU-CHEK SOFTCLIX LANCET DEV) KIT 1 each by Does not apply route daily. 09/21/15  Yes Glean Hess, MD  losartan-hydrochlorothiazide (HYZAAR) 100-25 MG tablet Take 1 tablet by mouth daily. 08/01/16  Yes Glean Hess, MD  metFORMIN (GLUCOPHAGE-XR) 500 MG 24 hr tablet TAKE 1 TABLET DAILY WITH BREAKFAST 08/08/16  Yes Glean Hess, MD  metoprolol succinate (TOPROL-XL) 100 MG 24 hr tablet TAKE 1 AND 1/2 TABLETS EVERY DAY 08/08/16  Yes Glean Hess, MD  mupirocin ointment (BACTROBAN) 2 % PLACE 1 APPLICATION INTO THE NOSE 2 TIMES A DAILY. 10/20/15  Yes Glean Hess, MD  omeprazole (PRILOSEC) 20 MG capsule Take 1 capsule (20 mg total) by mouth 2 (two) times daily before a meal.  05/03/17  Yes Glean Hess, MD  simvastatin (ZOCOR) 40 MG tablet TAKE 1 TABLET AT BEDTIME. 08/08/16  Yes Glean Hess, MD  traMADol Veatrice Bourbon) 50 MG tablet TAKE 1 TABLET BY MOUTH TWICE DAILY 05/10/17  Yes Glean Hess, MD  TRUE METRIX BLOOD GLUCOSE TEST test strip TEST EVERY DAY 11/07/16  Yes Glean Hess, MD    Family History  Problem Relation Age of Onset  . CAD Father        died age 98  . Diabetes Maternal Grandmother   . Breast cancer Neg Hx      Social History   Tobacco Use  . Smoking status: Current Every Day Smoker      Packs/day: 0.50    Years: 30.00    Pack years: 15.00    Types: Cigarettes  . Smokeless tobacco: Current User  Substance Use Topics  . Alcohol use: Yes    Alcohol/week: 1.2 oz    Types: 2 Standard drinks or equivalent per week    Comment: occasional  . Drug use: No    Allergies as of 05/17/2017 - Review Complete 05/17/2017  Allergen Reaction Noted  . Glipizide Nausea Only 09/28/2015    Review of Systems:    All systems reviewed and negative except where noted in HPI.   Physical Exam:  Vital signs in last 24 hours: Vitals reviewed   General:   Pleasant, cooperative in NAD Head:  Normocephalic and atraumatic. Eyes:   No icterus.   Conjunctiva pink. PERRLA. Ears:  Normal auditory acuity. Neck:  Supple; no masses or thyroidomegaly Lungs: Respirations even and unlabored. Lungs clear to auscultation bilaterally.   No wheezes, crackles, or rhonchi.  Heart:  Regular rate and rhythm;  Without murmur, clicks, rubs or gallops Abdomen:  Soft, nondistended, nontender. Normal bowel sounds. No appreciable masses or hepatomegaly.  No rebound or guarding.  Neurologic:  Alert and oriented x3;  grossly normal neurologically. Skin:  Intact without significant lesions or rashes. Cervical Nodes:  No significant cervical adenopathy. Psych:  Alert and cooperative. Normal affect.  LAB RESULTS: No results for input(s): WBC, HGB, HCT, PLT in the last 72 hours. BMET No results for input(s): NA, K, CL, CO2, GLUCOSE, BUN, CREATININE, CALCIUM in the last 72 hours. LFT No results for input(s): PROT, ALBUMIN, AST, ALT, ALKPHOS, BILITOT, BILIDIR, IBILI in the last 72 hours. PT/INR No results for input(s): LABPROT, INR in the last 72 hours.   Oct 31st labs reviewed and Hgb improved since [facility] discharge   STUDIES: No results found. CT report from Oct 2018 [facility] admission reviewed   Impression / Plan:   Kiara Campbell is a 61 y.o. y/o Campbell with recent sigmoid diverticulitis admitted to [facility] for  the same with previous colonoscopy in 2005 reportedly normal   Patient's episode of diverticulitis and very recent Would schedule colonoscopy in 2-3 months since diverticulitis episode. Will also scheduled EGD with colonoscopy as pt was on Naproxen during admission with significant drop in hgb and is now on Omeprazole. EGD would allow for evaluation for any PUD and need to continue or d/c PPI. Pt instructed to contact us with any recurrent bleeding or any other reason for concern. Pt. Asked to stay off NSAID.   Risks of longterm PPI use also discussed including CKD, infection, C. Diff, pneumonia, bone loss, hypomagnesemia and she verbalized understanding.  As far as endoscopic procedures, I have discussed alternative options, risks & benefits,  which include, but are not limited to,  bleeding, infection, perforation,respiratory complication & drug reaction.  The patient agrees with this plan & written consent will be obtained.    Thank you for involving me in the care of this patient.    Virgel Manifold, MD  05/17/2017, 2:40 PM

## 2017-06-02 ENCOUNTER — Other Ambulatory Visit: Payer: Medicare PPO

## 2017-06-08 ENCOUNTER — Other Ambulatory Visit: Payer: Self-pay | Admitting: Internal Medicine

## 2017-06-08 DIAGNOSIS — M170 Bilateral primary osteoarthritis of knee: Secondary | ICD-10-CM

## 2017-06-12 ENCOUNTER — Ambulatory Visit: Payer: Medicare PPO | Admitting: Internal Medicine

## 2017-06-14 ENCOUNTER — Encounter: Payer: Self-pay | Admitting: Internal Medicine

## 2017-06-14 ENCOUNTER — Ambulatory Visit: Payer: Medicare PPO | Admitting: Internal Medicine

## 2017-06-14 VITALS — BP 116/68 | HR 64 | Ht 66.0 in | Wt 184.0 lb

## 2017-06-14 DIAGNOSIS — M170 Bilateral primary osteoarthritis of knee: Secondary | ICD-10-CM

## 2017-06-14 DIAGNOSIS — M109 Gout, unspecified: Secondary | ICD-10-CM | POA: Diagnosis not present

## 2017-06-14 DIAGNOSIS — E119 Type 2 diabetes mellitus without complications: Secondary | ICD-10-CM | POA: Diagnosis not present

## 2017-06-14 DIAGNOSIS — I10 Essential (primary) hypertension: Secondary | ICD-10-CM

## 2017-06-14 MED ORDER — COLCHICINE 0.6 MG PO TABS: 0.6000 mg | ORAL_TABLET | Freq: Two times a day (BID) | ORAL | 0 refills | Status: DC

## 2017-06-14 NOTE — Progress Notes (Signed)
Date:  06/14/2017   Name:  Kiara Campbell   DOB:  [birth year]   MRN:  [id number]   Chief Complaint: Hypertension; Diabetes; and Foot Pain (3 weeks- started with big toe and now pain radiates on top of foot. Pain is terrible and getting worse. Touching hurts it worse. ) Hypertension  This is a chronic problem. The problem is unchanged. The problem is controlled. Associated symptoms include shortness of breath. Pertinent negatives include no chest pain or palpitations. Past treatments include ACE inhibitors and diuretics.  Diabetes  She presents for her follow-up diabetic visit. She has type 2 diabetes mellitus. Her disease course has been stable. Pertinent negatives for hypoglycemia include no nervousness/anxiousness. Pertinent negatives for diabetes include no chest pain and no fatigue. Current diabetic treatment includes oral agent (monotherapy). She is compliant with treatment all of the time. Her weight is stable. There is no change in her home blood glucose trend.  Foot pain - started with no injury about 3 weeks ago.  Base of right great toe very red and swollen.  Hurting across the entire foot.  Hurts to pressure from bedclothes or socks.  Lab Results  Component Value Date   HGBA1C 6.3 (H) 02/07/2017   Lab Results  Component Value Date   CREATININE 0.70 05/03/2017   BUN 8 05/03/2017   NA 144 05/03/2017   K 4.7 05/03/2017   CL 104 05/03/2017   CO2 23 05/03/2017      Review of Systems  Constitutional: Negative for chills, fatigue and fever.  Respiratory: Positive for shortness of breath. Negative for cough, chest tightness and wheezing.   Cardiovascular: Negative for chest pain and palpitations.  Musculoskeletal: Positive for arthralgias and joint swelling.  Psychiatric/Behavioral: Negative for sleep disturbance. The patient is not nervous/anxious.     Patient Active Problem List   Diagnosis Date Noted  . Primary osteoarthritis of both knees 08/01/2016  . Controlled type  2 diabetes mellitus without complication, without long-term current use of insulin ([place]) 06/02/2015  . Fibrocystic breast disease 12/03/2014  . Colon polyp 12/03/2014  . Diverticulitis of colon 12/03/2014  . Essential (primary) hypertension 12/03/2014  . Herniated nucleus pulposus 12/03/2014  . OP (osteoporosis) 12/03/2014  . Hyperlipidemia associated with type 2 diabetes mellitus ([place]) 12/03/2014  . Calcium blood increased 12/23/2013    Prior to Admission medications   Medication Sig Start Date End Date Taking? Authorizing Provider  ACCU-CHEK SOFTCLIX LANCETS lancets Use as instructed 09/21/15  Yes Glean Hess, MD  Alcohol Swabs (B-D SINGLE USE SWABS REGULAR) PADS USE DAILY. 08/16/16  Yes Glean Hess, MD  Blood Glucose Monitoring Suppl (TRUE METRIX AIR GLUCOSE METER) DEVI 1 each by Does not apply route daily. 07/29/15  Yes Glean Hess, MD  Calcium Carb-Cholecalciferol (CALCIUM + D3) 600-200 MG-UNIT TABS Take 1 tablet by mouth daily.   Yes [provider]  diclofenac sodium (VOLTAREN) 1 % GEL Apply topically. 04/25/17 04/25/18 Yes [provider]  fluticasone Asencion Islam) 50 MCG/ACT nasal spray  02/12/15  Yes [provider]  gabapentin (NEURONTIN) 100 MG capsule TAKE 1 TO 3 CAPSULES AT BEDTIME 12/27/15  Yes Glean Hess, MD  Lancet Devices [facility]) lancets 1 each by Other route daily. Use as instructed 03/12/15  Yes Glean Hess, MD  Lancets Misc. (ACCU-CHEK SOFTCLIX LANCET DEV) KIT 1 each by Does not apply route daily. 09/21/15  Yes Glean Hess, MD  losartan-hydrochlorothiazide (HYZAAR) 100-25 MG tablet Take 1 tablet by mouth  daily. 08/01/16  Yes Glean Hess, MD  metFORMIN (GLUCOPHAGE-XR) 500 MG 24 hr tablet TAKE 1 TABLET DAILY WITH BREAKFAST 08/08/16  Yes Glean Hess, MD  metoprolol succinate (TOPROL-XL) 100 MG 24 hr tablet TAKE 1 AND 1/2 TABLETS EVERY DAY 08/08/16  Yes Glean Hess, MD  mupirocin ointment  (BACTROBAN) 2 % PLACE 1 APPLICATION INTO THE NOSE 2 TIMES A DAILY. 10/20/15  Yes Glean Hess, MD  omeprazole (PRILOSEC) 20 MG capsule Take 1 capsule (20 mg total) by mouth 2 (two) times daily before a meal. 05/03/17  Yes Glean Hess, MD  psyllium (METAMUCIL SMOOTH TEXTURE) 28 % packet Take 1 packet by mouth 2 (two) times daily.   Yes [provider]  simvastatin (ZOCOR) 40 MG tablet TAKE 1 TABLET AT BEDTIME. 08/08/16  Yes Glean Hess, MD  traMADol (ULTRAM) 50 MG tablet TAKE 1 TABLET BY MOUTH TWICE DAILY 06/09/17  Yes Glean Hess, MD  TRUE METRIX BLOOD GLUCOSE TEST test strip TEST EVERY DAY 11/07/16  Yes Glean Hess, MD    Allergies  Allergen Reactions  . Glipizide Nausea Only    Past Surgical History:  Procedure Laterality Date  . ABDOMINAL HYSTERECTOMY     partial  . APPENDECTOMY    . BREAST CYST ASPIRATION     neg not sure which side  . TONSILLECTOMY    . VARICOSE VEIN SURGERY      Social History   Tobacco Use  . Smoking status: Current Every Day Smoker    Packs/day: 0.50    Years: 30.00    Pack years: 15.00    Types: Cigarettes  . Smokeless tobacco: Current User  Substance Use Topics  . Alcohol use: Yes    Alcohol/week: 1.2 oz    Types: 2 Standard drinks or equivalent per week    Comment: occasional  . Drug use: No     Medication list has been reviewed and updated.  PHQ 2/9 Scores 02/07/2017 01/28/2016  PHQ - 2 Score 0 0    Physical Exam  Constitutional: She is oriented to person, place, and time. She appears well-developed. No distress.  HENT:  Head: Normocephalic and atraumatic.  Neck: Normal range of motion. Neck supple. No thyromegaly present.  Cardiovascular: Normal rate, regular rhythm and normal heart sounds.  Pulmonary/Chest: Effort normal. No respiratory distress. She has wheezes.  Abdominal: Soft. Normal appearance.  Musculoskeletal:  Right great toe swollen, red, warm and tender  Neurological: She is alert and  oriented to person, place, and time.  Skin: Skin is warm and dry. No rash noted.  Psychiatric: She has a normal mood and affect. Her speech is normal and behavior is normal. Thought content normal.  Nursing note and vitals reviewed.   BP 116/68   Pulse 64   Ht _0  (1.676 m)   Wt 184 lb (83.5 kg)   LMP 09/27/1995   BMI 29.70 kg/m   Assessment and Plan: 1. Essential (primary) hypertension controlled - CBC with Differential/Platelet  2. Controlled type 2 diabetes mellitus without complication, without long-term current use of insulin ([facility]) Continue diet and oral medication - Hemoglobin A1c  3. Primary osteoarthritis of both knees Planning surgery for January  4. Acute gout involving toe of right foot, unspecified cause - colchicine (COLCRYS) 0.6 MG tablet; Take 1 tablet (0.6 mg total) by mouth 2 (two) times daily.  Dispense: 20 tablet; Refill: 0 - Uric acid   Meds ordered this encounter  Medications  .  colchicine (COLCRYS) 0.6 MG tablet    Sig: Take 1 tablet (0.6 mg total) by mouth 2 (two) times daily.    Dispense:  20 tablet    Refill:  0    Partially dictated using Editor, commissioning. Any errors are unintentional.  Halina Maidens, MD [facility]  06/14/2017

## 2017-06-14 NOTE — Patient Instructions (Signed)
Take Colcrys twice a day with food for up to 5 days or until the gout pain has resolved

## 2017-06-15 LAB — CBC WITH DIFFERENTIAL/PLATELET
Basophils Absolute: 0 10*3/uL (ref 0.0–0.2)
Basos: 0 %
EOS (ABSOLUTE): 0.1 10*3/uL (ref 0.0–0.4)
EOS: 1 %
HEMATOCRIT: 36.4 % (ref 34.0–46.6)
HEMOGLOBIN: 11.9 g/dL (ref 11.1–15.9)
Immature Grans (Abs): 0 10*3/uL (ref 0.0–0.1)
Immature Granulocytes: 0 %
LYMPHS ABS: 2.3 10*3/uL (ref 0.7–3.1)
LYMPHS: 30 %
MCH: 30.4 pg (ref 26.6–33.0)
MCHC: 32.7 g/dL (ref 31.5–35.7)
MCV: 93 fL (ref 79–97)
MONOCYTES: 29 %
MONOS ABS: 2.3 10*3/uL — AB (ref 0.1–0.9)
NEUTROS PCT: 40 %
Neutrophils Absolute: 3.1 10*3/uL (ref 1.4–7.0)
Platelets: 271 10*3/uL (ref 150–379)
RBC: 3.92 x10E6/uL (ref 3.77–5.28)
RDW: 17.1 % — ABNORMAL HIGH (ref 12.3–15.4)
WBC: 7.9 10*3/uL (ref 3.4–10.8)

## 2017-06-15 LAB — HEMOGLOBIN A1C
ESTIMATED AVERAGE GLUCOSE: 117 mg/dL
Hgb A1c MFr Bld: 5.7 % — ABNORMAL HIGH (ref 4.8–5.6)

## 2017-06-15 LAB — URIC ACID: URIC ACID: 7.7 mg/dL — AB (ref 2.5–7.1)

## 2017-07-07 ENCOUNTER — Other Ambulatory Visit: Payer: Self-pay | Admitting: Internal Medicine

## 2017-07-07 DIAGNOSIS — M170 Bilateral primary osteoarthritis of knee: Secondary | ICD-10-CM

## 2017-07-12 DIAGNOSIS — M1711 Unilateral primary osteoarthritis, right knee: Secondary | ICD-10-CM | POA: Diagnosis not present

## 2017-07-12 DIAGNOSIS — K219 Gastro-esophageal reflux disease without esophagitis: Secondary | ICD-10-CM | POA: Insufficient documentation

## 2017-07-17 DIAGNOSIS — R011 Cardiac murmur, unspecified: Secondary | ICD-10-CM | POA: Diagnosis not present

## 2017-07-17 DIAGNOSIS — Z720 Tobacco use: Secondary | ICD-10-CM | POA: Diagnosis not present

## 2017-07-17 DIAGNOSIS — E785 Hyperlipidemia, unspecified: Secondary | ICD-10-CM | POA: Diagnosis not present

## 2017-07-17 DIAGNOSIS — Z01818 Encounter for other preprocedural examination: Secondary | ICD-10-CM | POA: Diagnosis not present

## 2017-07-17 DIAGNOSIS — M1711 Unilateral primary osteoarthritis, right knee: Secondary | ICD-10-CM | POA: Diagnosis not present

## 2017-07-17 DIAGNOSIS — I10 Essential (primary) hypertension: Secondary | ICD-10-CM | POA: Diagnosis not present

## 2017-07-17 DIAGNOSIS — E669 Obesity, unspecified: Secondary | ICD-10-CM | POA: Insufficient documentation

## 2017-07-17 DIAGNOSIS — E118 Type 2 diabetes mellitus with unspecified complications: Secondary | ICD-10-CM | POA: Diagnosis not present

## 2017-07-17 DIAGNOSIS — K219 Gastro-esophageal reflux disease without esophagitis: Secondary | ICD-10-CM | POA: Diagnosis not present

## 2017-07-21 DIAGNOSIS — R011 Cardiac murmur, unspecified: Secondary | ICD-10-CM | POA: Diagnosis not present

## 2017-07-25 DIAGNOSIS — J439 Emphysema, unspecified: Secondary | ICD-10-CM | POA: Diagnosis not present

## 2017-07-25 DIAGNOSIS — J9811 Atelectasis: Secondary | ICD-10-CM | POA: Diagnosis not present

## 2017-07-25 DIAGNOSIS — M1711 Unilateral primary osteoarthritis, right knee: Secondary | ICD-10-CM | POA: Diagnosis not present

## 2017-07-25 DIAGNOSIS — R0902 Hypoxemia: Secondary | ICD-10-CM | POA: Diagnosis not present

## 2017-07-25 DIAGNOSIS — Z6830 Body mass index (BMI) 30.0-30.9, adult: Secondary | ICD-10-CM | POA: Diagnosis not present

## 2017-07-25 DIAGNOSIS — R011 Cardiac murmur, unspecified: Secondary | ICD-10-CM | POA: Diagnosis not present

## 2017-07-25 DIAGNOSIS — E669 Obesity, unspecified: Secondary | ICD-10-CM | POA: Diagnosis not present

## 2017-07-25 DIAGNOSIS — M109 Gout, unspecified: Secondary | ICD-10-CM | POA: Diagnosis not present

## 2017-07-25 DIAGNOSIS — R0602 Shortness of breath: Secondary | ICD-10-CM | POA: Diagnosis not present

## 2017-07-25 DIAGNOSIS — G8918 Other acute postprocedural pain: Secondary | ICD-10-CM | POA: Diagnosis not present

## 2017-07-25 DIAGNOSIS — M25561 Pain in right knee: Secondary | ICD-10-CM | POA: Diagnosis not present

## 2017-07-25 DIAGNOSIS — F17210 Nicotine dependence, cigarettes, uncomplicated: Secondary | ICD-10-CM | POA: Diagnosis not present

## 2017-07-25 DIAGNOSIS — Z96651 Presence of right artificial knee joint: Secondary | ICD-10-CM | POA: Diagnosis not present

## 2017-07-25 DIAGNOSIS — E118 Type 2 diabetes mellitus with unspecified complications: Secondary | ICD-10-CM | POA: Diagnosis not present

## 2017-07-25 DIAGNOSIS — I10 Essential (primary) hypertension: Secondary | ICD-10-CM | POA: Diagnosis not present

## 2017-07-25 DIAGNOSIS — Z471 Aftercare following joint replacement surgery: Secondary | ICD-10-CM | POA: Diagnosis not present

## 2017-07-25 HISTORY — PX: REPLACEMENT TOTAL KNEE: SUR1224

## 2017-07-28 ENCOUNTER — Telehealth: Payer: Self-pay

## 2017-07-28 DIAGNOSIS — J439 Emphysema, unspecified: Secondary | ICD-10-CM | POA: Insufficient documentation

## 2017-07-28 NOTE — Telephone Encounter (Signed)
Georgia Dom from [facility] called stating patient is being discharged from the hospital for Rt Knee Replacement Surgery. She became hypoxic during surgery. She is being sent home with 2 liters of oxygen. Want to know if we can do further testing on patient to determine how long she will need to be on oxygen. They also found mild emphysema on imaging they did at hospital.   Spoke with Dr Army Melia: Hulen Skains and informed patient of [place]'s instructions to continue on oxygen as hospital instructed. Continue until she is on partial weight barring. Once she becomes more mobile, make appt to see Dr Army Melia to discuss oxygen and breathing. Left this VM on patient Cell Phone. Was unable to speak to her verbally. Went straight to VM.

## 2017-07-29 DIAGNOSIS — M1711 Unilateral primary osteoarthritis, right knee: Secondary | ICD-10-CM | POA: Diagnosis not present

## 2017-08-02 DIAGNOSIS — J439 Emphysema, unspecified: Secondary | ICD-10-CM | POA: Diagnosis not present

## 2017-08-02 DIAGNOSIS — I10 Essential (primary) hypertension: Secondary | ICD-10-CM | POA: Diagnosis not present

## 2017-08-02 DIAGNOSIS — Z471 Aftercare following joint replacement surgery: Secondary | ICD-10-CM | POA: Diagnosis not present

## 2017-08-02 DIAGNOSIS — M1712 Unilateral primary osteoarthritis, left knee: Secondary | ICD-10-CM | POA: Diagnosis not present

## 2017-08-02 DIAGNOSIS — E119 Type 2 diabetes mellitus without complications: Secondary | ICD-10-CM | POA: Diagnosis not present

## 2017-08-02 DIAGNOSIS — Z96651 Presence of right artificial knee joint: Secondary | ICD-10-CM | POA: Diagnosis not present

## 2017-08-09 ENCOUNTER — Ambulatory Visit: Payer: Medicare PPO | Admitting: Internal Medicine

## 2017-08-09 ENCOUNTER — Encounter: Payer: Self-pay | Admitting: Internal Medicine

## 2017-08-09 VITALS — BP 118/78 | HR 80 | Ht 66.0 in | Wt 171.0 lb

## 2017-08-09 DIAGNOSIS — M170 Bilateral primary osteoarthritis of knee: Secondary | ICD-10-CM | POA: Diagnosis not present

## 2017-08-09 DIAGNOSIS — F17201 Nicotine dependence, unspecified, in remission: Secondary | ICD-10-CM | POA: Insufficient documentation

## 2017-08-09 DIAGNOSIS — J449 Chronic obstructive pulmonary disease, unspecified: Secondary | ICD-10-CM | POA: Diagnosis not present

## 2017-08-09 DIAGNOSIS — S8011XA Contusion of right lower leg, initial encounter: Secondary | ICD-10-CM | POA: Diagnosis not present

## 2017-08-09 MED ORDER — TRAMADOL HCL 50 MG PO TABS: 50.0000 mg | ORAL_TABLET | Freq: Four times a day (QID) | ORAL | 0 refills | Status: DC | PRN

## 2017-08-09 NOTE — Progress Notes (Signed)
Date:  08/09/2017   Name:  Kiara Campbell   DOB:  December 04, 1955   MRN:  [id number]   Chief Complaint: Follow-up (Stopped oxygen yesterday. Pulse ox been 95 and above at home. Wants to know if should take baby asprin a day. Down leg there is a spot that is hard and painful to the point if feels like a iron is on top of skin. )  Hospital follow up from knee surgery.  Admitted to [facility] 07/25/17 - 07/28/17.  She was called by outreach 07/31/17.  She required O2 at discharge due to low sats during and after surgery.  She is no longer smoking and feels well.  She sent the oxygen back yesterday.  She has some swelling and tenderness along the lateral right calf - started on antibiotics for possible cellulitis by surgeon without being seen.  Stapled are due to come out tomorrow.  She continues have pain, mostly over the swelling.  She can not take narcotics - was discharged on dilaudid - but does do well with tramadol and only has enough for twice a day.  COPD - mild changes noted on CXR.  She had some atelectasis after surgery that seems to have resolved.  She should improve now that she is ambulatory.  Review of Systems  Constitutional: Negative for chills, fatigue and fever.  Eyes: Negative for visual disturbance.  Respiratory: Negative for cough, chest tightness and shortness of breath.   Cardiovascular: Positive for leg swelling. Negative for chest pain and palpitations.  Musculoskeletal: Positive for arthralgias, gait problem and myalgias.  Skin: Positive for color change.  Neurological: Negative for dizziness and headaches.  Psychiatric/Behavioral: Positive for sleep disturbance. Negative for dysphoric mood. The patient is not nervous/anxious.     Patient Active Problem List   Diagnosis Date Noted  . Primary osteoarthritis of both knees 08/01/2016  . Controlled type 2 diabetes mellitus without complication, without long-term current use of insulin ([place]) 06/02/2015  . Fibrocystic breast disease  12/03/2014  . Colon polyp 12/03/2014  . Diverticulitis of colon 12/03/2014  . Essential (primary) hypertension 12/03/2014  . Herniated nucleus pulposus 12/03/2014  . OP (osteoporosis) 12/03/2014  . Hyperlipidemia associated with type 2 diabetes mellitus ([place]) 12/03/2014  . Calcium blood increased 12/23/2013    Prior to Admission medications   Medication Sig Start Date End Date Taking? Authorizing Provider  ACCU-CHEK SOFTCLIX LANCETS lancets Use as instructed 09/21/15  Yes Glean Hess, MD  Alcohol Swabs (B-D SINGLE USE SWABS REGULAR) PADS USE DAILY. 08/16/16  Yes Glean Hess, MD  Blood Glucose Monitoring Suppl (TRUE METRIX AIR GLUCOSE METER) DEVI 1 each by Does not apply route daily. 07/29/15  Yes Glean Hess, MD  Calcium Carb-Cholecalciferol (CALCIUM + D3) 600-200 MG-UNIT TABS Take 1 tablet by mouth daily.   Yes [provider]  colchicine (COLCRYS) 0.6 MG tablet Take 1 tablet (0.6 mg total) by mouth 2 (two) times daily. 06/14/17  Yes Glean Hess, MD  diclofenac sodium (VOLTAREN) 1 % GEL Apply topically. 04/25/17 04/25/18 Yes [provider]  fluticasone Asencion Islam) 50 MCG/ACT nasal spray  02/12/15  Yes [provider]  Lancet Devices [facility]) lancets 1 each by Other route daily. Use as instructed 03/12/15  Yes Glean Hess, MD  Lancets Misc. (ACCU-CHEK SOFTCLIX LANCET DEV) KIT 1 each by Does not apply route daily. 09/21/15  Yes Glean Hess, MD  losartan-hydrochlorothiazide (HYZAAR) 100-25 MG tablet Take 1 tablet by mouth daily. 08/01/16  Yes Glean Hess, MD  metFORMIN (GLUCOPHAGE-XR) 500 MG 24 hr tablet TAKE 1 TABLET DAILY WITH BREAKFAST 08/08/16  Yes Glean Hess, MD  metoprolol succinate (TOPROL-XL) 100 MG 24 hr tablet TAKE 1 AND 1/2 TABLETS EVERY DAY 08/08/16  Yes Glean Hess, MD  mupirocin ointment (BACTROBAN) 2 % PLACE 1 APPLICATION INTO THE NOSE 2 TIMES A DAILY. 10/20/15  Yes Glean Hess, MD  omeprazole  (PRILOSEC) 20 MG capsule Take 1 capsule (20 mg total) by mouth 2 (two) times daily before a meal. 05/03/17  Yes Glean Hess, MD  psyllium (METAMUCIL SMOOTH TEXTURE) 28 % packet Take 1 packet by mouth 2 (two) times daily.   Yes [provider]  simvastatin (ZOCOR) 40 MG tablet TAKE 1 TABLET AT BEDTIME. 08/08/16  Yes Glean Hess, MD  traMADol (ULTRAM) 50 MG tablet TAKE 1 TABLET BY MOUTH TWICE DAILY 07/07/17  Yes Glean Hess, MD  TRUE METRIX BLOOD GLUCOSE TEST test strip TEST EVERY DAY 11/07/16  Yes Glean Hess, MD    Allergies  Allergen Reactions  . Glipizide Nausea Only    Past Surgical History:  Procedure Laterality Date  . ABDOMINAL HYSTERECTOMY     partial  . APPENDECTOMY    . BREAST CYST ASPIRATION     neg not sure which side  . TONSILLECTOMY    . VARICOSE VEIN SURGERY      Social History   Tobacco Use  . Smoking status: Former Smoker    Packs/day: 0.50    Years: 30.00    Pack years: 15.00    Types: Cigarettes    Last attempt to quit: 07/2016    Years since quitting: 1.0  . Smokeless tobacco: Current User  Substance Use Topics  . Alcohol use: Yes    Alcohol/week: 1.2 oz    Types: 2 Standard drinks or equivalent per week    Comment: occasional  . Drug use: No     Medication list has been reviewed and updated.  PHQ 2/9 Scores 02/07/2017 01/28/2016  PHQ - 2 Score 0 0    Physical Exam  Constitutional: She is oriented to person, place, and time. She appears well-developed. No distress.  HENT:  Head: Normocephalic and atraumatic.  Neck: Normal range of motion. Neck supple.  Cardiovascular: Normal rate, regular rhythm and normal heart sounds.  Pulmonary/Chest: Effort normal and breath sounds normal. No respiratory distress. She has no wheezes. She has no rales.  Musculoskeletal: Normal range of motion.       Legs: Neurological: She is alert and oriented to person, place, and time.  Skin: Skin is warm and dry. No rash noted.  Psychiatric:  She has a normal mood and affect. Her behavior is normal. Thought content normal.  Nursing note and vitals reviewed.   BP 118/78   Pulse 80   Ht _0  (1.676 m)   Wt 171 lb (77.6 kg)   LMP 09/27/1995   SpO2 97%   BMI 27.60 kg/m   Assessment and Plan: 1. Primary osteoarthritis of both knees S/p TKR with post op hematoma - advised this will take time to resolve Will increase tramadol to qid for the next month only - traMADol (ULTRAM) 50 MG tablet; Take 1 tablet (50 mg total) by mouth every 6 (six) hours as needed.  Dispense: 120 tablet; Refill: 0  2. Hematoma of leg, right, initial encounter Continue warm compresses Finish antibiotic and follow up with surgeon tomorrow  3. Tobacco use disorder,  severe, in early remission Congratulated on quitting  4. Chronic obstructive pulmonary disease, unspecified COPD type ([place]) Should improve with tobacco cessation    Meds ordered this encounter  Medications  . traMADol (ULTRAM) 50 MG tablet    Sig: Take 1 tablet (50 mg total) by mouth every 6 (six) hours as needed.    Dispense:  120 tablet    Refill:  0    Partially dictated using Editor, commissioning. Any errors are unintentional.  Halina Maidens, MD [facility]  08/09/2017

## 2017-08-28 DIAGNOSIS — E119 Type 2 diabetes mellitus without complications: Secondary | ICD-10-CM | POA: Diagnosis not present

## 2017-08-28 DIAGNOSIS — F17200 Nicotine dependence, unspecified, uncomplicated: Secondary | ICD-10-CM | POA: Diagnosis not present

## 2017-08-28 DIAGNOSIS — J449 Chronic obstructive pulmonary disease, unspecified: Secondary | ICD-10-CM | POA: Diagnosis not present

## 2017-08-28 DIAGNOSIS — E785 Hyperlipidemia, unspecified: Secondary | ICD-10-CM | POA: Diagnosis not present

## 2017-08-28 DIAGNOSIS — K219 Gastro-esophageal reflux disease without esophagitis: Secondary | ICD-10-CM | POA: Diagnosis not present

## 2017-08-28 DIAGNOSIS — Z96651 Presence of right artificial knee joint: Secondary | ICD-10-CM | POA: Diagnosis not present

## 2017-08-28 DIAGNOSIS — M1990 Unspecified osteoarthritis, unspecified site: Secondary | ICD-10-CM | POA: Diagnosis not present

## 2017-08-28 DIAGNOSIS — I119 Hypertensive heart disease without heart failure: Secondary | ICD-10-CM | POA: Diagnosis not present

## 2017-08-28 DIAGNOSIS — T8131XA Disruption of external operation (surgical) wound, not elsewhere classified, initial encounter: Secondary | ICD-10-CM | POA: Diagnosis not present

## 2017-09-18 ENCOUNTER — Telehealth: Payer: Self-pay

## 2017-09-18 NOTE — Telephone Encounter (Signed)
Called patient to see if she wanted to schedule her EGD and Colonoscopy based on your 05/17/18 office visit notes-"schedule colonoscopy in 2-3 months since diverticulitis episode. Will also scheduled EGD with colonoscopy as pt was on Naproxen during admission with significant drop in hgb".  Thanks [employer]

## 2017-09-19 ENCOUNTER — Other Ambulatory Visit: Payer: Self-pay | Admitting: Internal Medicine

## 2017-09-19 DIAGNOSIS — E785 Hyperlipidemia, unspecified: Secondary | ICD-10-CM

## 2017-09-19 DIAGNOSIS — E119 Type 2 diabetes mellitus without complications: Secondary | ICD-10-CM

## 2017-09-19 DIAGNOSIS — E1169 Type 2 diabetes mellitus with other specified complication: Secondary | ICD-10-CM

## 2017-09-19 DIAGNOSIS — I10 Essential (primary) hypertension: Secondary | ICD-10-CM

## 2017-09-20 NOTE — Telephone Encounter (Signed)
lvm to set up appt

## 2017-09-28 NOTE — Telephone Encounter (Signed)
Left message to contact office to schedule procedures (EGD/Colonoscopy) at home number.

## 2017-09-29 ENCOUNTER — Other Ambulatory Visit: Payer: Self-pay | Admitting: Internal Medicine

## 2017-09-29 DIAGNOSIS — M170 Bilateral primary osteoarthritis of knee: Secondary | ICD-10-CM

## 2017-10-03 ENCOUNTER — Telehealth: Payer: Self-pay

## 2017-10-03 NOTE — Telephone Encounter (Signed)
LVM for pt to contact office.  Thanks Oden Lindaman 

## 2017-10-04 DIAGNOSIS — Z471 Aftercare following joint replacement surgery: Secondary | ICD-10-CM | POA: Diagnosis not present

## 2017-10-04 DIAGNOSIS — Z96651 Presence of right artificial knee joint: Secondary | ICD-10-CM | POA: Diagnosis not present

## 2017-10-17 ENCOUNTER — Ambulatory Visit: Payer: Medicare PPO | Admitting: Internal Medicine

## 2017-10-17 ENCOUNTER — Encounter: Payer: Self-pay | Admitting: Internal Medicine

## 2017-10-17 VITALS — BP 114/78 | HR 79 | Ht 66.0 in | Wt 172.0 lb

## 2017-10-17 DIAGNOSIS — I10 Essential (primary) hypertension: Secondary | ICD-10-CM | POA: Diagnosis not present

## 2017-10-17 DIAGNOSIS — M109 Gout, unspecified: Secondary | ICD-10-CM

## 2017-10-17 DIAGNOSIS — E119 Type 2 diabetes mellitus without complications: Secondary | ICD-10-CM

## 2017-10-17 MED ORDER — COLCHICINE 0.6 MG PO TABS: 0.6000 mg | ORAL_TABLET | Freq: Two times a day (BID) | ORAL | 0 refills | Status: DC

## 2017-10-17 NOTE — Progress Notes (Signed)
Date:  10/17/2017   Name:  Kiara Campbell   DOB:  27-Nov-1955   MRN:  [id number]   Chief Complaint: Foot Pain (This is the patient's second flare up. First flare up was in [facility] 2018. This started over the weekend and is not getting any better. Swollen and red with lots of pain. ) Foot Injury   There was no injury mechanism. The pain is present in the right foot (has hx of gout in December 2018). The pain is moderate. The symptoms are aggravated by weight bearing and palpation (recently had knee replacement).  Diabetes  She presents for her follow-up diabetic visit. She has type 2 diabetes mellitus. Her disease course has been stable. Pertinent negatives for diabetes include no chest pain and no fatigue. An ACE inhibitor/angiotensin II receptor blocker is being taken.  Hypertension  This is a chronic problem. The problem is controlled. Pertinent negatives include no chest pain or shortness of breath. Past treatments include angiotensin blockers.    Review of Systems  Constitutional: Negative for chills, fatigue and fever.  Respiratory: Negative for chest tightness, shortness of breath and wheezing.   Cardiovascular: Negative for chest pain.  Musculoskeletal: Positive for arthralgias and joint swelling (right ankle).  Skin: Positive for color change. Negative for rash.  Psychiatric/Behavioral: Negative for sleep disturbance.    Patient Active Problem List   Diagnosis Date Noted  . Tobacco use disorder, severe, in early remission 08/09/2017  . Primary osteoarthritis of both knees 08/01/2016  . Controlled type 2 diabetes mellitus without complication, without long-term current use of insulin ([place]) 06/02/2015  . Fibrocystic breast disease 12/03/2014  . Colon polyp 12/03/2014  . Diverticulitis of colon 12/03/2014  . Essential (primary) hypertension 12/03/2014  . Herniated nucleus pulposus 12/03/2014  . OP (osteoporosis) 12/03/2014  . Hyperlipidemia associated with type 2 diabetes  mellitus ([place]) 12/03/2014  . Calcium blood increased 12/23/2013    Prior to Admission medications   Medication Sig Start Date End Date Taking? Authorizing Provider  ACCU-CHEK SOFTCLIX LANCETS lancets Use as instructed 09/21/15   Glean Hess, MD  Alcohol Swabs (B-D SINGLE USE SWABS REGULAR) PADS USE DAILY. 09/20/17   Glean Hess, MD  Blood Glucose Monitoring Suppl (TRUE METRIX AIR GLUCOSE METER) DEVI 1 each by Does not apply route daily. 07/29/15   Glean Hess, MD  Calcium Carb-Cholecalciferol (CALCIUM + D3) 600-200 MG-UNIT TABS Take 1 tablet by mouth daily.    [provider]  colchicine (COLCRYS) 0.6 MG tablet Take 1 tablet (0.6 mg total) by mouth 2 (two) times daily. 06/14/17   Glean Hess, MD  diclofenac sodium (VOLTAREN) 1 % GEL Apply topically. 04/25/17 04/25/18  [provider]  fluticasone Asencion Islam) 50 MCG/ACT nasal spray  02/12/15   [provider]  Lancet Devices [facility]) lancets 1 each by Other route daily. Use as instructed 03/12/15   Glean Hess, MD  Lancets Misc. (ACCU-CHEK SOFTCLIX LANCET DEV) KIT 1 each by Does not apply route daily. 09/21/15   Glean Hess, MD  losartan-hydrochlorothiazide [facility]) 100-25 MG tablet TAKE 1 TABLET EVERY DAY 09/20/17   Glean Hess, MD  metFORMIN (GLUCOPHAGE-XR) 500 MG 24 hr tablet TAKE 1 TABLET DAILY WITH BREAKFAST 09/20/17   Glean Hess, MD  metoprolol succinate (TOPROL-XL) 100 MG 24 hr tablet TAKE 1 AND 1/2 TABLETS EVERY DAY 09/20/17   Glean Hess, MD  mupirocin ointment (BACTROBAN) 2 % PLACE 1 APPLICATION INTO THE NOSE 2 TIMES  A DAILY. 10/20/15   Glean Hess, MD  omeprazole (PRILOSEC) 20 MG capsule TAKE 1 CAPSULE (20 MG TOTAL) BY MOUTH 2 (TWO) TIMES DAILY BEFORE A MEAL. 09/20/17   Glean Hess, MD  psyllium (METAMUCIL SMOOTH TEXTURE) 28 % packet Take 1 packet by mouth 2 (two) times daily.    [provider]  simvastatin (ZOCOR) 40 MG tablet TAKE 1  TABLET AT BEDTIME 09/20/17   Glean Hess, MD  traMADol (ULTRAM) 50 MG tablet TAKE 1 TABLET BY MOUTH EVERY 6 HOURS AS NEEDED 09/29/17   Glean Hess, MD  TRUE METRIX BLOOD GLUCOSE TEST test strip TEST EVERY DAY 11/07/16   Glean Hess, MD    Allergies  Allergen Reactions  . Glipizide Nausea Only    Past Surgical History:  Procedure Laterality Date  . ABDOMINAL HYSTERECTOMY     partial  . APPENDECTOMY    . BREAST CYST ASPIRATION     neg not sure which side  . 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 TONSILLECTOMY    . VARICOSE VEIN SURGERY      Social History   Tobacco Use  . Smoking status: Former Smoker    Packs/day: 0.50    Years: 30.00    Pack years: 15.00    Types: Cigarettes    Last attempt to quit: 07/2016    Years since quitting: 1.2  . Smokeless tobacco: Current User  Substance Use Topics  . Alcohol use: Yes    Alcohol/week: 1.2 oz    Types: 2 Standard drinks or equivalent per week    Comment: occasional  . Drug use: No     Medication list has been reviewed and updated.  PHQ 2/9 Scores 02/07/2017 01/28/2016  PHQ - 2 Score 0 0    Physical Exam  Constitutional: She is oriented to person, place, and time. She appears well-developed. No distress.  HENT:  Head: Normocephalic and atraumatic.  Pulmonary/Chest: Effort normal. No respiratory distress.  Musculoskeletal:       Right ankle: She exhibits decreased range of motion and swelling. Tenderness.  Neurological: She is alert and oriented to person, place, and time.  Skin: Skin is warm and dry. No rash noted.  Psychiatric: She has a normal mood and affect. Her behavior is normal. Thought content normal.    BP 114/78   Pulse 79   Ht _0  (1.676 m)   Wt 172 lb (78 kg)   LMP 09/27/1995   SpO2 97%   BMI 27.76 kg/m   Assessment and Plan: 1. Acute gout of right ankle, unspecified cause - Uric acid - colchicine (COLCRYS) 0.6 MG tablet; Take 1 tablet (0.6 mg total) by mouth 2 (two) times  daily.  Dispense: 20 tablet; Refill: 0  2. Essential (primary) hypertension controlled - Basic metabolic panel  3. Controlled type 2 diabetes mellitus without complication, without long-term current use of insulin ([facility]) Continue oral medication - Hemoglobin A1c   Meds ordered this encounter  Medications  . colchicine (COLCRYS) 0.6 MG tablet    Sig: Take 1 tablet (0.6 mg total) by mouth 2 (two) times daily.    Dispense:  20 tablet    Refill:  0    Partially dictated using Editor, commissioning. Any errors are unintentional.  Halina Maidens, MD [facility]  10/17/2017

## 2017-10-18 LAB — HEMOGLOBIN A1C
ESTIMATED AVERAGE GLUCOSE: 123 mg/dL
HEMOGLOBIN A1C: 5.9 % — AB (ref 4.8–5.6)

## 2017-10-18 LAB — BASIC METABOLIC PANEL
BUN / CREAT RATIO: 19 (ref 12–28)
BUN: 15 mg/dL (ref 8–27)
CO2: 26 mmol/L (ref 20–29)
Calcium: 10.5 mg/dL — ABNORMAL HIGH (ref 8.7–10.3)
Chloride: 98 mmol/L (ref 96–106)
Creatinine, Ser: 0.8 mg/dL (ref 0.57–1.00)
GFR calc Af Amer: 90 mL/min/{1.73_m2} (ref >59)
GFR calc non Af Amer: 78 mL/min/{1.73_m2} (ref >59)
GLUCOSE: 104 mg/dL — AB (ref 65–99)
Potassium: 4.2 mmol/L (ref 3.5–5.2)
SODIUM: 141 mmol/L (ref 134–144)

## 2017-10-18 LAB — URIC ACID: Uric Acid: 9.1 mg/dL — ABNORMAL HIGH (ref 2.5–7.1)

## 2017-11-01 ENCOUNTER — Other Ambulatory Visit: Payer: Self-pay | Admitting: Internal Medicine

## 2017-11-01 DIAGNOSIS — M170 Bilateral primary osteoarthritis of knee: Secondary | ICD-10-CM

## 2017-11-22 ENCOUNTER — Telehealth: Payer: Self-pay

## 2017-11-22 NOTE — Telephone Encounter (Signed)
LVM for patient callback.   Sent letter requesting contact.

## 2017-11-22 NOTE — Telephone Encounter (Signed)
Mailed contact letter to the patient.

## 2017-12-28 ENCOUNTER — Ambulatory Visit: Payer: Medicare PPO | Admitting: Internal Medicine

## 2017-12-28 ENCOUNTER — Encounter: Payer: Self-pay | Admitting: Internal Medicine

## 2017-12-28 VITALS — BP 125/78 | HR 71 | Temp 97.8°F | Resp 16 | Ht 66.0 in | Wt 173.0 lb

## 2017-12-28 DIAGNOSIS — M10071 Idiopathic gout, right ankle and foot: Secondary | ICD-10-CM | POA: Diagnosis not present

## 2017-12-28 MED ORDER — COLCHICINE 0.6 MG PO TABS: 0.6000 mg | ORAL_TABLET | Freq: Two times a day (BID) | ORAL | 0 refills | Status: DC

## 2017-12-28 MED ORDER — ALLOPURINOL 100 MG PO TABS: 100.0000 mg | ORAL_TABLET | Freq: Every day | ORAL | 6 refills | Status: DC

## 2017-12-28 NOTE — Patient Instructions (Signed)
Take Colcrys twice a day for at least 5 days or until symptoms are gone, then the next day begin Allopurinol once a day

## 2017-12-28 NOTE — Progress Notes (Signed)
Date:  12/28/2017   Name:  Kiara Campbell   DOB:  1955-10-25   MRN:  [id number]   Chief Complaint: Foot Pain (right ) Pt had flare of gout last weekend,  She took colchicine for three days and the pain resolved.  Now it is returning.  Pain in the right heel, tender to touch and warm.  She has had several flares in the past 6 months and is not on a preventative.   Review of Systems  Constitutional: Negative for chills, diaphoresis and fever.  Respiratory: Negative for chest tightness.   Cardiovascular: Negative for chest pain, palpitations and leg swelling.  Musculoskeletal: Positive for arthralgias and gait problem.  Skin: Negative for rash.  Psychiatric/Behavioral: Negative for sleep disturbance.    Patient Active Problem List   Diagnosis Date Noted  . Tobacco use disorder, severe, in early remission 08/09/2017  . Primary osteoarthritis of both knees 08/01/2016  . Controlled type 2 diabetes mellitus without complication, without long-term current use of insulin ([place]) 06/02/2015  . Fibrocystic breast disease 12/03/2014  . Colon polyp 12/03/2014  . Diverticulitis of colon 12/03/2014  . Essential (primary) hypertension 12/03/2014  . Herniated nucleus pulposus 12/03/2014  . OP (osteoporosis) 12/03/2014  . Hyperlipidemia associated with type 2 diabetes mellitus ([place]) 12/03/2014  . Calcium blood increased 12/23/2013    Prior to Admission medications   Medication Sig Start Date End Date Taking? Authorizing Provider  ACCU-CHEK SOFTCLIX LANCETS lancets Use as instructed 09/21/15  Yes Glean Hess, MD  Alcohol Swabs (B-D SINGLE USE SWABS REGULAR) PADS USE DAILY. 09/20/17  Yes Glean Hess, MD  Blood Glucose Monitoring Suppl (TRUE METRIX AIR GLUCOSE METER) DEVI 1 each by Does not apply route daily. 07/29/15  Yes Glean Hess, MD  Calcium Carb-Cholecalciferol (CALCIUM + D3) 600-200 MG-UNIT TABS Take 1 tablet by mouth daily.   Yes [provider]  colchicine  (COLCRYS) 0.6 MG tablet Take 1 tablet (0.6 mg total) by mouth 2 (two) times daily. 10/17/17  Yes Glean Hess, MD  diclofenac sodium (VOLTAREN) 1 % GEL Apply topically. 04/25/17 04/25/18 Yes [provider]  fluticasone Asencion Islam) 50 MCG/ACT nasal spray  02/12/15  Yes [provider]  Lancet Devices [facility]) lancets 1 each by Other route daily. Use as instructed 03/12/15  Yes Glean Hess, MD  Lancets Misc. (ACCU-CHEK SOFTCLIX LANCET DEV) KIT 1 each by Does not apply route daily. 09/21/15  Yes Glean Hess, MD  losartan-hydrochlorothiazide [facility]) 100-25 MG tablet TAKE 1 TABLET EVERY DAY 09/20/17  Yes Glean Hess, MD  metFORMIN (GLUCOPHAGE-XR) 500 MG 24 hr tablet TAKE 1 TABLET DAILY WITH BREAKFAST 09/20/17  Yes Glean Hess, MD  metoprolol succinate (TOPROL-XL) 100 MG 24 hr tablet TAKE 1 AND 1/2 TABLETS EVERY DAY 09/20/17  Yes Glean Hess, MD  mupirocin ointment (BACTROBAN) 2 % PLACE 1 APPLICATION INTO THE NOSE 2 TIMES A DAILY. 10/20/15  Yes Glean Hess, MD  omeprazole (PRILOSEC) 20 MG capsule TAKE 1 CAPSULE (20 MG TOTAL) BY MOUTH 2 (TWO) TIMES DAILY BEFORE A MEAL. 09/20/17  Yes Glean Hess, MD  psyllium (METAMUCIL SMOOTH TEXTURE) 28 % packet Take 1 packet by mouth 2 (two) times daily.   Yes [provider]  simvastatin (ZOCOR) 40 MG tablet TAKE 1 TABLET AT BEDTIME 09/20/17  Yes Glean Hess, MD  traMADol (ULTRAM) 50 MG tablet TAKE 1 TABLET BY MOUTH EVERY 6 HOURS AS NEEDED 11/01/17  Yes Army Melia,  Jesse Sans, MD  TRUE METRIX BLOOD GLUCOSE TEST test strip TEST EVERY DAY 11/07/16  Yes Glean Hess, MD    Allergies  Allergen Reactions  . Glipizide Nausea Only    Past Surgical History:  Procedure Laterality Date  . ABDOMINAL HYSTERECTOMY     partial  . APPENDECTOMY    . BREAST CYST ASPIRATION     neg not sure which side  . 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 TONSILLECTOMY    . VARICOSE VEIN SURGERY       Social History   Tobacco Use  . Smoking status: Light Tobacco Smoker    Packs/day: 0.25    Years: 30.00    Pack years: 7.50    Types: Cigarettes    Last attempt to quit: 07/2016    Years since quitting: 1.4  . Smokeless tobacco: Current User  Substance Use Topics  . Alcohol use: Yes    Alcohol/week: 1.2 oz    Types: 2 Standard drinks or equivalent per week    Comment: occasional  . Drug use: No     Medication list has been reviewed and updated.  Current Meds  Medication Sig  . ACCU-CHEK SOFTCLIX LANCETS lancets Use as instructed  . Alcohol Swabs (B-D SINGLE USE SWABS REGULAR) PADS USE DAILY.  Marland Kitchen Blood Glucose Monitoring Suppl (TRUE METRIX AIR GLUCOSE METER) DEVI 1 each by Does not apply route daily.  . Calcium Carb-Cholecalciferol (CALCIUM + D3) 600-200 MG-UNIT TABS Take 1 tablet by mouth daily.  . colchicine (COLCRYS) 0.6 MG tablet Take 1 tablet (0.6 mg total) by mouth 2 (two) times daily.  . diclofenac sodium (VOLTAREN) 1 % GEL Apply topically.  . fluticasone (FLONASE) 50 MCG/ACT nasal spray   . Lancet Devices (ACCU-CHEK SOFTCLIX) lancets 1 each by Other route daily. Use as instructed  . Lancets Misc. (ACCU-CHEK SOFTCLIX LANCET DEV) KIT 1 each by Does not apply route daily.  Marland Kitchen losartan-hydrochlorothiazide (HYZAAR) 100-25 MG tablet TAKE 1 TABLET EVERY DAY  . metFORMIN (GLUCOPHAGE-XR) 500 MG 24 hr tablet TAKE 1 TABLET DAILY WITH BREAKFAST  . metoprolol succinate (TOPROL-XL) 100 MG 24 hr tablet TAKE 1 AND 1/2 TABLETS EVERY DAY  . mupirocin ointment (BACTROBAN) 2 % PLACE 1 APPLICATION INTO THE NOSE 2 TIMES A DAILY.  Marland Kitchen omeprazole (PRILOSEC) 20 MG capsule TAKE 1 CAPSULE (20 MG TOTAL) BY MOUTH 2 (TWO) TIMES DAILY BEFORE A MEAL.  Marland Kitchen psyllium (METAMUCIL SMOOTH TEXTURE) 28 % packet Take 1 packet by mouth 2 (two) times daily.  . simvastatin (ZOCOR) 40 MG tablet TAKE 1 TABLET AT BEDTIME  . traMADol (ULTRAM) 50 MG tablet TAKE 1 TABLET BY MOUTH EVERY 6 HOURS AS NEEDED  . TRUE METRIX  BLOOD GLUCOSE TEST test strip TEST EVERY DAY  . [DISCONTINUED] colchicine (COLCRYS) 0.6 MG tablet Take 1 tablet (0.6 mg total) by mouth 2 (two) times daily.  . [DISCONTINUED] colchicine (COLCRYS) 0.6 MG tablet Take 1 tablet (0.6 mg total) by mouth 2 (two) times daily.    PHQ 2/9 Scores 02/07/2017 01/28/2016  PHQ - 2 Score 0 0    Physical Exam  Constitutional: She is oriented to person, place, and time. She appears well-developed. No distress.  HENT:  Head: Normocephalic and atraumatic.  Cardiovascular: Normal rate, regular rhythm and normal heart sounds.  Pulmonary/Chest: Effort normal. No respiratory distress.  Musculoskeletal: Normal range of motion. She exhibits tenderness (right heel - slightly warm, no swelling).  Neurological: She is alert and oriented to person, place, and time.  Skin: Skin is warm and dry. No rash noted.  Psychiatric: She has a normal mood and affect. Her behavior is normal. Thought content normal.  Nursing note and vitals reviewed.   BP 125/78   Pulse 71   Temp 97.8 F (36.6 C) (Oral)   Resp 16   Ht '5\' 6"'$  (1.676 m)   Wt 173 lb (78.5 kg)   LMP 09/27/1995   SpO2 97%   BMI 27.92 kg/m   Assessment and Plan: 1. Acute idiopathic gout of right foot Take colchicine for 5 days or until sx resolve then begin allopurinol - allopurinol (ZYLOPRIM) 100 MG tablet; Take 1 tablet (100 mg total) by mouth daily.  Dispense: 30 tablet; Refill: 6   Meds ordered this encounter  Medications  . allopurinol (ZYLOPRIM) 100 MG tablet    Sig: Take 1 tablet (100 mg total) by mouth daily.    Dispense:  30 tablet    Refill:  6  . colchicine (COLCRYS) 0.6 MG tablet    Sig: Take 1 tablet (0.6 mg total) by mouth 2 (two) times daily.    Dispense:  30 tablet    Refill:  0    Partially dictated using Editor, commissioning. Any errors are unintentional.  Halina Maidens, MD [facility]  12/28/2017

## 2018-01-03 ENCOUNTER — Other Ambulatory Visit: Payer: Self-pay

## 2018-01-03 DIAGNOSIS — M10071 Idiopathic gout, right ankle and foot: Secondary | ICD-10-CM

## 2018-01-03 MED ORDER — ALLOPURINOL 100 MG PO TABS: 100.0000 mg | ORAL_TABLET | Freq: Every day | ORAL | 1 refills | Status: DC

## 2018-01-09 ENCOUNTER — Other Ambulatory Visit: Payer: Self-pay | Admitting: Internal Medicine

## 2018-01-09 DIAGNOSIS — M10071 Idiopathic gout, right ankle and foot: Secondary | ICD-10-CM

## 2018-01-09 MED ORDER — ALLOPURINOL 100 MG PO TABS: 100.0000 mg | ORAL_TABLET | Freq: Every day | ORAL | 1 refills | Status: DC

## 2018-01-19 ENCOUNTER — Telehealth: Payer: Self-pay

## 2018-01-19 NOTE — Telephone Encounter (Signed)
Patient informed to wait to up dosing until her flare up is gone. Then increase the allopurinol to 2 tabs per day. Told her to call and schedule a one month f/up so we can monitor her kidney function. Offered to transer to front desk and she stated she will call back to schedule.

## 2018-01-19 NOTE — Telephone Encounter (Signed)
Patient called stating she was prescribed allopurinol for her gout recently and was told at the visit we may have to go up on dosing if the first method doesn't help. She stated she is still having pain and would like to increase dose. Wanted to know if she could, and how much to increase to?  Please Advise.

## 2018-01-19 NOTE — Telephone Encounter (Signed)
She can go up to 2 per day but needs to see me in one month for lab monitoring of kidney function.

## 2018-02-21 ENCOUNTER — Other Ambulatory Visit: Payer: Self-pay

## 2018-02-21 ENCOUNTER — Ambulatory Visit (INDEPENDENT_AMBULATORY_CARE_PROVIDER_SITE_OTHER): Payer: Medicare PPO

## 2018-02-21 VITALS — BP 110/60 | HR 75 | Temp 98.3°F | Resp 12 | Ht 66.0 in | Wt 172.8 lb

## 2018-02-21 DIAGNOSIS — M10071 Idiopathic gout, right ankle and foot: Secondary | ICD-10-CM

## 2018-02-21 DIAGNOSIS — Z0000 Encounter for general adult medical examination without abnormal findings: Secondary | ICD-10-CM

## 2018-02-21 MED ORDER — ALLOPURINOL 100 MG PO TABS: 100.0000 mg | ORAL_TABLET | Freq: Two times a day (BID) | ORAL | 0 refills | Status: DC

## 2018-02-21 NOTE — Progress Notes (Signed)
Subjective:   Kiara Campbell is a 62 y.o. female who presents for Medicare Annual (Subsequent) preventive examination.  Review of Systems:  N/A Cardiac Risk Factors include: advanced age (>1mn, >>9women);diabetes mellitus;dyslipidemia;hypertension;sedentary lifestyle;smoking/ tobacco exposure     Objective:     Vitals: BP 110/60 (BP Location: Right Arm, Patient Position: Sitting, Cuff Size: Normal)   Pulse 75   Temp 98.3 F (36.8 C) (Oral)   Resp 12   Ht '5\' 6"'$  (1.676 m)   Wt 172 lb 12.8 oz (78.4 kg)   LMP 09/27/1995   SpO2 94%   BMI 27.89 kg/m   Body mass index is 27.89 kg/m.  Advanced Directives 02/21/2018 02/07/2017 01/28/2016 09/28/2015 09/16/2015  Does Patient Have a Medical Advance Directive? No No No No No  Would patient like information on creating a medical advance directive? Yes (MAU/Ambulatory/Procedural Areas - Information given) - Yes - Educational materials given No - patient declined information No - patient declined information    Tobacco Social History   Tobacco Use  Smoking Status Current Some Day Smoker  . Packs/day: 0.50  . Years: 30.00  . Pack years: 15.00  . Types: Cigarettes  Smokeless Tobacco Current User     Ready to quit: No Counseling given: Yes  Clinical Intake:  Pre-visit preparation completed: Yes  Pain : No/denies pain   BMI - recorded: 27.94 Nutritional Status: BMI 25 -29 Overweight Nutritional Risks: None  Nutrition Risk Assessment: Has the patient had any N/V/D within the last 2 months?  No Does the patient have any non-healing wounds?  No Has the patient had any unintentional weight loss or weight gain?  No  Is the patient diabetic?  Yes If diabetic, was a CBG obtained today?  No Did the patient bring in their glucometer from home?  No Comments: Pt monitors CBG's 2-3x's per week. Denies any financial strains with the device or supplies.  Diabetic Exams: Diabetic Eye Exam: Completed 10/23/15. Overdue for diabetic eye  exam. Pt has been advised about the importance in completing this exam. Pt has requested to schedule her own appt with Dr. MRuthann Cancer Diabetic Foot Exam: Completed 02/07/17. Pt has been advised about the importance in completing this exam. Pt is scheduled for diabetic foot exam on 02/28/18.  How often do you need to have someone help you when you read instructions, pamphlets, or other written materials from your doctor or pharmacy?: 1 - Never  Interpreter Needed?: No  Information entered by :: AIdell Pickles LPN  Past Medical History:  Diagnosis Date  . [place]   . Hyperlipidemia   . Hypertension    Past Surgical History:  Procedure Laterality Date  . ABDOMINAL HYSTERECTOMY     partial  . APPENDECTOMY    . BREAST CYST ASPIRATION     neg not sure which side  . 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 TONSILLECTOMY    . VARICOSE VEIN SURGERY     Family History  Problem Relation Age of Onset  . CAD Father        died age 64 . COPD Mother   . Diabetes Maternal Grandmother   . Breast cancer Neg Hx    Social History   Socioeconomic History  . Marital status: Married    Spouse name: Not on file  . Number of children: 2  . Years of education: Not on file  . Highest education level: Bachelor's degree (e.g., BA, AB, BS)  Occupational History  .  Occupation: Retired  Scientific laboratory technician  . Financial resource strain: Not hard at all  . Food insecurity:    Worry: Never true    Inability: Never true  . Transportation needs:    Medical: No    Non-medical: No  Tobacco Use  . Smoking status: Current Some Day Smoker    Packs/day: 0.50    Years: 30.00    Pack years: 15.00    Types: Cigarettes  . Smokeless tobacco: Current User  Substance and Sexual Activity  . Alcohol use: Yes    Comment: occasional  . Drug use: No  . Sexual activity: Not Currently  Lifestyle  . Physical activity:    Days per week: 0 days    Minutes per session: 0 min  .  Stress: Not at all  Relationships  . Social connections:    Talks on phone: Patient refused    Gets together: Patient refused    Attends religious service: Patient refused    Active member of club or organization: Patient refused    Attends meetings of clubs or organizations: Patient refused    Relationship status: Married  Other Topics Concern  . Not on file  Social History Narrative  . Not on file    Outpatient Encounter Medications as of 02/21/2018  Medication Sig  . ACCU-CHEK SOFTCLIX LANCETS lancets Use as instructed  . Alcohol Swabs (B-D SINGLE USE SWABS REGULAR) PADS USE DAILY.  Marland Kitchen Blood Glucose Monitoring Suppl (TRUE METRIX AIR GLUCOSE METER) DEVI 1 each by Does not apply route daily.  . Calcium Carb-Cholecalciferol (CALCIUM + D3) 600-200 MG-UNIT TABS Take 1 tablet by mouth daily.  . colchicine (COLCRYS) 0.6 MG tablet Take 1 tablet (0.6 mg total) by mouth 2 (two) times daily.  . diclofenac sodium (VOLTAREN) 1 % GEL Apply topically.  . fexofenadine (ALLEGRA) 60 MG tablet Take 60 mg by mouth 2 (two) times daily.  . fluticasone (FLONASE) 50 MCG/ACT nasal spray   . Lancet Devices (ACCU-CHEK SOFTCLIX) lancets 1 each by Other route daily. Use as instructed  . Lancets Misc. (ACCU-CHEK SOFTCLIX LANCET DEV) KIT 1 each by Does not apply route daily.  Marland Kitchen losartan-hydrochlorothiazide (HYZAAR) 100-25 MG tablet TAKE 1 TABLET EVERY DAY  . metFORMIN (GLUCOPHAGE-XR) 500 MG 24 hr tablet TAKE 1 TABLET DAILY WITH BREAKFAST  . metoprolol succinate (TOPROL-XL) 100 MG 24 hr tablet TAKE 1 AND 1/2 TABLETS EVERY DAY  . mupirocin ointment (BACTROBAN) 2 % PLACE 1 APPLICATION INTO THE NOSE 2 TIMES A DAILY.  Marland Kitchen omeprazole (PRILOSEC) 20 MG capsule TAKE 1 CAPSULE (20 MG TOTAL) BY MOUTH 2 (TWO) TIMES DAILY BEFORE A MEAL.  Marland Kitchen psyllium (METAMUCIL SMOOTH TEXTURE) 28 % packet Take 1 packet by mouth 2 (two) times daily.  . simvastatin (ZOCOR) 40 MG tablet TAKE 1 TABLET AT BEDTIME  . traMADol (ULTRAM) 50 MG tablet  TAKE 1 TABLET BY MOUTH EVERY 6 HOURS AS NEEDED  . TRUE METRIX BLOOD GLUCOSE TEST test strip TEST EVERY DAY  . [DISCONTINUED] allopurinol (ZYLOPRIM) 100 MG tablet Take 1 tablet (100 mg total) by mouth daily. (Patient taking differently: Take 200 mg by mouth daily. )   No facility-administered encounter medications on file as of 02/21/2018.     Activities of Daily Living In your present state of health, do you have any difficulty performing the following activities: 02/21/2018  Hearing? N  Comment denies hearing aids  Vision? N  Comment wears eyeglasses  Difficulty concentrating or making decisions? Y  Walking or climbing stairs?  Y  Comment avoids stairs  Dressing or bathing? N  Doing errands, shopping? N  Preparing Food and eating ? N  Comment full set upper and lower dentures  Using the Toilet? N  In the past six months, have you accidently leaked urine? N  Do you have problems with loss of bowel control? Y  Comment rectal prolapse  Managing your Medications? N  Managing your Finances? N  Housekeeping or managing your Housekeeping? N  Some recent data might be hidden    Patient Care Team: Glean Hess, MD as PCP - General (Internal Medicine) [facility] as Consulting Physician Burnis Kingfisher, MD as Consulting Physician (Orthopedic Surgery)    Assessment:   This is a routine wellness examination for Poonam.  Exercise Activities and Dietary recommendations Current Exercise Habits: The patient does not participate in regular exercise at present, Exercise limited by: orthopedic condition(s)(sciatica; RTKR)  Goals    . DIET - INCREASE WATER INTAKE     Recommend to drink at least 6-8 8oz glasses of water per day.    . Family Time     Has raised grand kids for over 12 months after loss of daughter in law and wants to keep family time important.        Fall Risk Fall Risk  02/21/2018 02/07/2017 01/28/2016  Falls in the past year? No No No  Risk for fall due to :  Impaired vision;History of fall(s);Impaired balance/gait - -  Risk for fall due to: Comment wears eyeglasses; sciatica and RTKR - -   FALL RISK PREVENTION PERTAINING TO HOME: Is your home free of loose throw rugs in walkways, pet beds, electrical cords, etc? Yes Is there adequate lighting in your home to reduce risk of falls?  Yes Are there stairs in or around your home WITH handrails? No stairs  ASSISTIVE DEVICES UTILIZED TO PREVENT FALLS: Use of a cane, walker or w/c? Yes, cane Grab bars in the bathroom? Yes  Shower chair or a place to sit while bathing? Yes An elevated toilet seat or a handicapped toilet? Yes  Timed Get Up and Go Performed: Yes. Pt ambulated 10 feet within 25 sec. Gait slow, steady and without the use of an assistive device. No intervention required at this time. Fall risk prevention has been discussed.  Community Resource Referral:  [place] Referral not required at this time.   Depression Screen PHQ 2/9 Scores 02/21/2018 02/07/2017 01/28/2016  PHQ - 2 Score 0 0 0  PHQ- 9 Score 0 - -     Cognitive Function     6CIT Screen 02/21/2018 02/07/2017  What Year? 0 points 0 points  What month? 0 points 0 points  What time? 0 points 0 points  Count back from 20 0 points 0 points  Months in reverse 2 points 0 points  Repeat phrase 2 points -  Total Score 4 -    Immunization History  Administered Date(s) Administered  . Influenza,inj,Quad PF,6+ Mos 03/10/2015, 04/26/2016, 04/25/2017  . Influenza-Unspecified 04/12/2017  . Pneumococcal Conjugate-13 01/28/2016  . Pneumococcal Polysaccharide-23 07/05/2008  . Tdap 07/06/2011  . Zoster 07/05/2012    Qualifies for Shingles Vaccine? Yes. Zostavax completed 07/05/12. Due for Shingrix. Education has been provided regarding the importance of this vaccine. Pt has been advised to call insurance company to determine out of pocket expense. Advised may also receive vaccine at local pharmacy or Health Dept. Verbalized  acceptance and understanding.  Screening Tests Health Maintenance  Topic Date Due  .  MAMMOGRAM  12/14/2016  . INFLUENZA VACCINE  02/01/2018  . FOOT EXAM  02/07/2018  . OPHTHALMOLOGY EXAM  04/23/2018 (Originally 10/22/2016)  . COLONOSCOPY  02/22/2019 (Originally 07/05/2013)  . HEMOGLOBIN A1C  04/18/2018  . TETANUS/TDAP  07/05/2021  . Hepatitis C Screening  Addressed  . HIV Screening  Addressed  . PAP SMEAR  Discontinued    Cancer Screenings: Lung: Low Dose CT Chest recommended if Age 12-80 years, 30 pack-year currently smoking OR have quit w/in 15years. Patient does not qualify. Breast:  Up to date on Mammogram? No. Completed 12/15/15. Ordered 05/03/17. States she has not been scheduled. Message sent to referral coordinator for follow up   Up to date of Bone Density/Dexa? Not yet required Colorectal: Completed 07/06/03. Repeat every 10 years. Declined my offer to refer to GI for completion of screening colonoscopy. Education provided re: importance of this screening but still declined.  Additional Screenings: Hepatitis C Screening: Completed 12/16/13    Plan:  I have personally reviewed and addressed the Medicare Annual Wellness questionnaire and have noted the following in the patient's chart:  A. Medical and social history B. Use of alcohol, tobacco or illicit drugs  C. Current medications and supplements D. Functional ability and status E.  Nutritional status F.  Physical activity G. Advance directives H. List of other physicians I.  Hospitalizations, surgeries, and ER visits in previous 12 months J.  [place] such as hearing and vision if needed, cognitive and depression L. Referrals and appointments  In addition, I have reviewed and discussed with patient certain preventive protocols, quality metrics, and best practice recommendations. A written personalized care plan for preventive services as well as general preventive health recommendations were provided to  patient.  Signed,  Aleatha Borer, LPN Nurse Health Advisor  MD Recommendations: Zostavax completed 07/05/12. Due for Shingrix. Education has been provided regarding the importance of this vaccine. Pt has been advised to call insurance company to determine out of pocket expense. Advised may also receive vaccine at local pharmacy or Health Dept. Verbalized acceptance and understanding.  Mammogram: Completed 12/15/15. Ordered 05/03/17. States she has not been scheduled. Message sent to referral coordinator for follow up    Colorectal: Completed 07/06/03. Repeat every 10 years. Declined my offer to refer to GI for completion of screening colonoscopy. Education provided re: importance of this screening but still declined.  Diabetic Eye Exam: Completed 10/23/15. Overdue for diabetic eye exam. Pt has been advised about the importance in completing this exam. Pt has requested to schedule her own appt with Dr. Ruthann Cancer.

## 2018-02-21 NOTE — Patient Instructions (Signed)
Kiara Campbell , Thank you for taking time to come for your Medicare Wellness Visit. I appreciate your ongoing commitment to your health goals. Please review the following plan we discussed and let me know if I can assist you in the future.   Screening recommendations/referrals: Colorectal Screening: Declined Mammogram: You will receive a call from our office regarding your appointment Bone Density: Not yet required  Vision and Dental Exams: Recommended annual ophthalmology exams for early detection of glaucoma and other disorders of the eye Recommended annual dental exams for proper oral hygiene  Diabetic Exams: Recommended annual diabetic eye exams for early detection of retinopathy Recommended annual diabetic foot exams for early detection of peripheral neuropathy.  Diabetic Eye Exam: Please call to schedule your appointment Diabetic Foot Exam: Please keep your appointment as scheduled with Dr. Army Melia  Vaccinations: Influenza vaccine: Up to date Pneumococcal vaccine: Not yet required Tdap vaccine: Up to date Shingles vaccine: Please call your insurance company to determine your out of pocket expense for the Shingrix vaccine. You may also receive this vaccine at your local pharmacy or Health Dept.    Advanced directives: Advance directive discussed with you today. I have provided a copy for you to complete at home and have notarized. Once this is complete please bring a copy in to our office so we can scan it into your chart.  Goals: Recommend to drink at least 6-8 8oz glasses of water per day.  Next appointment: Please schedule your Annual Wellness Visit with your Nurse Health Advisor in one year.  Preventive Care 40-64 Years, Female Preventive care refers to lifestyle choices and visits with your health care provider that can promote health and wellness. What does preventive care include?  A yearly physical exam. This is also called an annual well check.  Dental exams once or  twice a year.  Routine eye exams. Ask your health care provider how often you should have your eyes checked.  Personal lifestyle choices, including:  Daily care of your teeth and gums.  Regular physical activity.  Eating a healthy diet.  Avoiding tobacco and drug use.  Limiting alcohol use.  Practicing safe sex.  Taking low-dose aspirin daily starting at age 61.  Taking vitamin and mineral supplements as recommended by your health care provider. What happens during an annual well check? The services and screenings done by your health care provider during your annual well check will depend on your age, overall health, lifestyle risk factors, and family history of disease. Counseling  Your health care provider may ask you questions about your:  Alcohol use.  Tobacco use.  Drug use.  Emotional well-being.  Home and relationship well-being.  Sexual activity.  Eating habits.  Work and work Statistician.  Method of birth control.  Menstrual cycle.  Pregnancy history. Screening  You may have the following tests or measurements:  Height, weight, and BMI.  Blood pressure.  Lipid and cholesterol levels. These may be checked every 5 years, or more frequently if you are over 70 years old.  Skin check.  Lung cancer screening. You may have this screening every year starting at age 50 if you have a 30-pack-year history of smoking and currently smoke or have quit within the past 15 years.  Fecal occult blood test (FOBT) of the stool. You may have this test every year starting at age 67.  Flexible sigmoidoscopy or colonoscopy. You may have a sigmoidoscopy every 5 years or a colonoscopy every 10 years starting at age 75.  Hepatitis C blood test.  Hepatitis B blood test.  Sexually transmitted disease (STD) testing.  Diabetes screening. This is done by checking your blood sugar (glucose) after you have not eaten for a while (fasting). You may have this done every 1-3  years.  Mammogram. This may be done every 1-2 years. Talk to your health care provider about when you should start having regular mammograms. This may depend on whether you have a family history of breast cancer.  BRCA-related cancer screening. This may be done if you have a family history of breast, ovarian, tubal, or peritoneal cancers.  Pelvic exam and Pap test. This may be done every 3 years starting at age 15. Starting at age 43, this may be done every 5 years if you have a Pap test in combination with an HPV test.  Bone density scan. This is done to screen for osteoporosis. You may have this scan if you are at high risk for osteoporosis. Discuss your test results, treatment options, and if necessary, the need for more tests with your health care provider. Vaccines  Your health care provider may recommend certain vaccines, such as:  Influenza vaccine. This is recommended every year.  Tetanus, diphtheria, and acellular pertussis (Tdap, Td) vaccine. You may need a Td booster every 10 years.  Zoster vaccine. You may need this after age 81.  Pneumococcal 13-valent conjugate (PCV13) vaccine. You may need this if you have certain conditions and were not previously vaccinated.  Pneumococcal polysaccharide (PPSV23) vaccine. You may need one or two doses if you smoke cigarettes or if you have certain conditions. Talk to your health care provider about which screenings and vaccines you need and how often you need them. This information is not intended to replace advice given to you by your health care provider. Make sure you discuss any questions you have with your health care provider. Document Released: 07/17/2015 Document Revised: 03/09/2016 Document Reviewed: 04/21/2015 Elsevier Interactive Patient Education  2017 [place] Prevention in the Home Falls can cause injuries. They can happen to people of all ages. There are many things you can do to make your home safe and to  help prevent falls. What can I do on the outside of my home?  Regularly fix the edges of walkways and driveways and fix any cracks.  Remove anything that might make you trip as you walk through a door, such as a raised step or threshold.  Trim any bushes or trees on the path to your home.  Use bright outdoor lighting.  Clear any walking paths of anything that might make someone trip, such as rocks or tools.  Regularly check to see if handrails are loose or broken. Make sure that both sides of any steps have handrails.  Any raised decks and porches should have guardrails on the edges.  Have any leaves, snow, or ice cleared regularly.  Use sand or salt on walking paths during winter.  Clean up any spills in your garage right away. This includes oil or grease spills. What can I do in the bathroom?  Use night lights.  Install grab bars by the toilet and in the tub and shower. Do not use towel bars as grab bars.  Use non-skid mats or decals in the tub or shower.  If you need to sit down in the shower, use a plastic, non-slip stool.  Keep the floor dry. Clean up any water that spills on the floor as soon as it  happens.  Remove soap buildup in the tub or shower regularly.  Attach bath mats securely with double-sided non-slip rug tape.  Do not have throw rugs and other things on the floor that can make you trip. What can I do in the bedroom?  Use night lights.  Make sure that you have a light by your bed that is easy to reach.  Do not use any sheets or blankets that are too big for your bed. They should not hang down onto the floor.  Have a firm chair that has side arms. You can use this for support while you get dressed.  Do not have throw rugs and other things on the floor that can make you trip. What can I do in the kitchen?  Clean up any spills right away.  Avoid walking on wet floors.  Keep items that you use a lot in easy-to-reach places.  If you need to reach  something above you, use a strong step stool that has a grab bar.  Keep electrical cords out of the way.  Do not use floor polish or wax that makes floors slippery. If you must use wax, use non-skid floor wax.  Do not have throw rugs and other things on the floor that can make you trip. What can I do with my stairs?  Do not leave any items on the stairs.  Make sure that there are handrails on both sides of the stairs and use them. Fix handrails that are broken or loose. Make sure that handrails are as long as the stairways.  Check any carpeting to make sure that it is firmly attached to the stairs. Fix any carpet that is loose or worn.  Avoid having throw rugs at the top or bottom of the stairs. If you do have throw rugs, attach them to the floor with carpet tape.  Make sure that you have a light switch at the top of the stairs and the bottom of the stairs. If you do not have them, ask someone to add them for you. What else can I do to help prevent falls?  Wear shoes that:  Do not have high heels.  Have rubber bottoms.  Are comfortable and fit you well.  Are closed at the toe. Do not wear sandals.  If you use a stepladder:  Make sure that it is fully opened. Do not climb a closed stepladder.  Make sure that both sides of the stepladder are locked into place.  Ask someone to hold it for you, if possible.  Clearly mark and make sure that you can see:  Any grab bars or handrails.  First and last steps.  Where the edge of each step is.  Use tools that help you move around (mobility aids) if they are needed. These include:  Canes.  Walkers.  Scooters.  Crutches.  Turn on the lights when you go into a dark area. Replace any light bulbs as soon as they burn out.  Set up your furniture so you have a clear path. Avoid moving your furniture around.  If any of your floors are uneven, fix them.  If there are any pets around you, be aware of where they are.  Review  your medicines with your doctor. Some medicines can make you feel dizzy. This can increase your chance of falling. Ask your doctor what other things that you can do to help prevent falls. This information is not intended to replace advice given to you by  your health care provider. Make sure you discuss any questions you have with your health care provider. Document Released: 04/16/2009 Document Revised: 11/26/2015 Document Reviewed: 07/25/2014 Elsevier Interactive Patient Education  2017 [employer].

## 2018-02-26 ENCOUNTER — Other Ambulatory Visit: Payer: Self-pay | Admitting: Internal Medicine

## 2018-02-26 DIAGNOSIS — M170 Bilateral primary osteoarthritis of knee: Secondary | ICD-10-CM

## 2018-02-28 ENCOUNTER — Encounter: Payer: Self-pay | Admitting: Internal Medicine

## 2018-02-28 ENCOUNTER — Ambulatory Visit (INDEPENDENT_AMBULATORY_CARE_PROVIDER_SITE_OTHER): Payer: Medicare PPO | Admitting: Internal Medicine

## 2018-02-28 VITALS — BP 124/78 | HR 71 | Ht 66.0 in | Wt 172.0 lb

## 2018-02-28 DIAGNOSIS — K219 Gastro-esophageal reflux disease without esophagitis: Secondary | ICD-10-CM

## 2018-02-28 DIAGNOSIS — E785 Hyperlipidemia, unspecified: Secondary | ICD-10-CM | POA: Diagnosis not present

## 2018-02-28 DIAGNOSIS — I10 Essential (primary) hypertension: Secondary | ICD-10-CM | POA: Diagnosis not present

## 2018-02-28 DIAGNOSIS — Z1231 Encounter for screening mammogram for malignant neoplasm of breast: Secondary | ICD-10-CM | POA: Diagnosis not present

## 2018-02-28 DIAGNOSIS — M5116 Intervertebral disc disorders with radiculopathy, lumbar region: Secondary | ICD-10-CM | POA: Diagnosis not present

## 2018-02-28 DIAGNOSIS — M10071 Idiopathic gout, right ankle and foot: Secondary | ICD-10-CM

## 2018-02-28 DIAGNOSIS — E1169 Type 2 diabetes mellitus with other specified complication: Secondary | ICD-10-CM | POA: Diagnosis not present

## 2018-02-28 DIAGNOSIS — E119 Type 2 diabetes mellitus without complications: Secondary | ICD-10-CM

## 2018-02-28 DIAGNOSIS — Z1239 Encounter for other screening for malignant neoplasm of breast: Secondary | ICD-10-CM

## 2018-02-28 DIAGNOSIS — D126 Benign neoplasm of colon, unspecified: Secondary | ICD-10-CM

## 2018-02-28 DIAGNOSIS — Z0000 Encounter for general adult medical examination without abnormal findings: Secondary | ICD-10-CM | POA: Diagnosis not present

## 2018-02-28 LAB — POCT URINALYSIS DIPSTICK
Bilirubin, UA: NEGATIVE
Blood, UA: NEGATIVE
Glucose, UA: NEGATIVE
Ketones, UA: NEGATIVE
LEUKOCYTES UA: NEGATIVE
NITRITE UA: NEGATIVE
PROTEIN UA: NEGATIVE
SPEC GRAV UA: 1.015 (ref 1.010–1.025)
Urobilinogen, UA: 0.2 E.U./dL
pH, UA: 6 (ref 5.0–8.0)

## 2018-02-28 MED ORDER — METFORMIN HCL ER 500 MG PO TB24: 500.0000 mg | ORAL_TABLET | Freq: Every day | ORAL | 1 refills | Status: DC

## 2018-02-28 MED ORDER — METOPROLOL SUCCINATE ER 50 MG PO TB24: 150.0000 mg | ORAL_TABLET | Freq: Every day | ORAL | 1 refills | Status: DC

## 2018-02-28 MED ORDER — OMEPRAZOLE 20 MG PO CPDR: 20.0000 mg | DELAYED_RELEASE_CAPSULE | Freq: Two times a day (BID) | ORAL | 1 refills | Status: DC

## 2018-02-28 MED ORDER — ALLOPURINOL 300 MG PO TABS: 300.0000 mg | ORAL_TABLET | Freq: Every day | ORAL | 1 refills | Status: DC

## 2018-02-28 MED ORDER — LOSARTAN POTASSIUM-HCTZ 100-25 MG PO TABS: 1.0000 | ORAL_TABLET | Freq: Every day | ORAL | 1 refills | Status: DC

## 2018-02-28 NOTE — Patient Instructions (Addendum)
Call [facility] to schedule Mammogram [facility] [phone]).  Someone from [facility] should call soon to schedule the colonoscopy.

## 2018-02-28 NOTE — Progress Notes (Signed)
Date:  02/28/2018   Name:  Kiara Campbell   DOB:  1956/01/26   MRN:  [id number]   Chief Complaint: Annual Exam (Breast Exam.) and Diabetes Kiara Campbell is a 62 y.o. female who presents today for her Complete Annual Exam. She feels well. She reports exercising none due to back and foot pain. She reports she is sleeping poorly.  She is overdue for mammogram.  Still has lumpy left breast but no pain or nipple discharge. She was referred to GI for colonoscopy but has not scheduled due to concern about cost and going to the right center.  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 She is due for eye exam but needs to clear up issues with her debit card.  Diabetes  She presents for her follow-up diabetic visit. She has type 2 diabetes mellitus. Pertinent negatives for hypoglycemia include no dizziness, headaches, nervousness/anxiousness or tremors. Associated symptoms include weakness. Pertinent negatives for diabetes include no chest pain, no fatigue, no polydipsia and no polyuria. Symptoms are stable. Risk factors for coronary artery disease include dyslipidemia and diabetes mellitus. Current diabetic treatment includes diet. She monitors blood glucose at home 3-4 x per week. Her breakfast blood glucose is taken between 6-7 am. Her breakfast blood glucose range is generally 90-110 mg/dl. An ACE inhibitor/angiotensin II receptor blocker is being taken. Eye exam is not current.  Hypertension  This is a chronic problem. The problem is controlled. Associated symptoms include shortness of breath. Pertinent negatives include no chest pain, headaches or palpitations. Past treatments include beta blockers, diuretics and ACE inhibitors. The current treatment provides significant improvement.  Hyperlipidemia  This is a chronic problem. Associated symptoms include leg pain and shortness of breath. Pertinent negatives include no chest pain. Current antihyperlipidemic treatment includes statins. The  current treatment provides significant improvement of lipids.  Back Pain  This is a chronic problem. The problem is unchanged. The pain is present in the lumbar spine. The quality of the pain is described as burning and cramping. The pain radiates to the left foot. The pain is moderate. Associated symptoms include leg pain and weakness. Pertinent negatives include no abdominal pain, bladder incontinence, bowel incontinence, chest pain, dysuria, fever, headaches or perianal numbness. She has tried analgesics for the symptoms. The treatment provided mild relief.  Gout - persistent pain and swelling in right ankle.  Taking allopurinol 200 mg per day.     Review of Systems  Constitutional: Negative for chills, fatigue and fever.  HENT: Negative for congestion, hearing loss, tinnitus, trouble swallowing and voice change.   Eyes: Negative for visual disturbance.  Respiratory: Positive for shortness of breath. Negative for cough, chest tightness and wheezing.   Cardiovascular: Negative for chest pain, palpitations and leg swelling.  Gastrointestinal: Negative for abdominal pain, bowel incontinence, constipation, diarrhea and vomiting.  Endocrine: Negative for polydipsia and polyuria.  Genitourinary: Negative for bladder incontinence, dysuria, frequency and genital sores.  Musculoskeletal: Positive for back pain and gait problem. Negative for arthralgias and joint swelling.  Skin: Negative for color change and rash.  Neurological: Positive for weakness. Negative for dizziness, tremors, light-headedness and headaches.  Hematological: Negative for adenopathy. Does not bruise/bleed easily.  Psychiatric/Behavioral: Positive for sleep disturbance (due to pain). Negative for dysphoric mood. The patient is not nervous/anxious.     Patient Active Problem List   Diagnosis Date Noted  . Tobacco use disorder, severe, in early remission 08/09/2017  . Primary osteoarthritis of both knees 08/01/2016  .  Controlled type 2 diabetes mellitus without complication, without long-term current use of insulin ([place]) 06/02/2015  . Fibrocystic breast disease 12/03/2014  . Colon polyp 12/03/2014  . Diverticulitis of colon 12/03/2014  . Essential (primary) hypertension 12/03/2014  . Herniated nucleus pulposus 12/03/2014  . OP (osteoporosis) 12/03/2014  . Hyperlipidemia associated with type 2 diabetes mellitus ([place]) 12/03/2014  . Calcium blood increased 12/23/2013    Allergies  Allergen Reactions  . Glipizide Nausea Only    Past Surgical History:  Procedure Laterality Date  . ABDOMINAL HYSTERECTOMY     partial  . APPENDECTOMY    . BREAST CYST ASPIRATION     neg not sure which side  . 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 TONSILLECTOMY    . VARICOSE VEIN SURGERY      Social History   Tobacco Use  . Smoking status: Current Some Day Smoker    Packs/day: 0.50    Years: 30.00    Pack years: 15.00    Types: Cigarettes  . Smokeless tobacco: Current User  Substance Use Topics  . Alcohol use: Yes    Comment: occasional  . Drug use: No     Medication list has been reviewed and updated.  Current Meds  Medication Sig  . ACCU-CHEK SOFTCLIX LANCETS lancets Use as instructed  . Alcohol Swabs (B-D SINGLE USE SWABS REGULAR) PADS USE DAILY.  Marland Kitchen allopurinol (ZYLOPRIM) 100 MG tablet Take 1 tablet (100 mg total) by mouth 2 (two) times daily.  . Blood Glucose Monitoring Suppl (TRUE METRIX AIR GLUCOSE METER) DEVI 1 each by Does not apply route daily.  . Calcium Carb-Cholecalciferol (CALCIUM + D3) 600-200 MG-UNIT TABS Take 1 tablet by mouth daily.  . colchicine (COLCRYS) 0.6 MG tablet Take 1 tablet (0.6 mg total) by mouth 2 (two) times daily.  . diclofenac sodium (VOLTAREN) 1 % GEL Apply topically.  . fexofenadine (ALLEGRA) 60 MG tablet Take 60 mg by mouth 2 (two) times daily.  . fluticasone (FLONASE) 50 MCG/ACT nasal spray   . Lancet Devices (ACCU-CHEK SOFTCLIX) lancets 1 each by Other route  daily. Use as instructed  . Lancets Misc. (ACCU-CHEK SOFTCLIX LANCET DEV) KIT 1 each by Does not apply route daily.  Marland Kitchen losartan-hydrochlorothiazide (HYZAAR) 100-25 MG tablet TAKE 1 TABLET EVERY DAY  . metFORMIN (GLUCOPHAGE-XR) 500 MG 24 hr tablet TAKE 1 TABLET DAILY WITH BREAKFAST  . metoprolol succinate (TOPROL-XL) 100 MG 24 hr tablet TAKE 1 AND 1/2 TABLETS EVERY DAY  . mupirocin ointment (BACTROBAN) 2 % PLACE 1 APPLICATION INTO THE NOSE 2 TIMES A DAILY.  Marland Kitchen omeprazole (PRILOSEC) 20 MG capsule TAKE 1 CAPSULE (20 MG TOTAL) BY MOUTH 2 (TWO) TIMES DAILY BEFORE A MEAL.  Marland Kitchen psyllium (METAMUCIL SMOOTH TEXTURE) 28 % packet Take 1 packet by mouth 2 (two) times daily.  . simvastatin (ZOCOR) 40 MG tablet TAKE 1 TABLET AT BEDTIME  . traMADol (ULTRAM) 50 MG tablet TAKE 1 TABLET BY MOUTH EVERY 6 HOURS AS NEEDED  . TRUE METRIX BLOOD GLUCOSE TEST test strip TEST EVERY DAY    PHQ 2/9 Scores 02/21/2018 02/07/2017 01/28/2016  PHQ - 2 Score 0 0 0  PHQ- 9 Score 0 - -    Physical Exam  Constitutional: She is oriented to person, place, and time. She appears well-developed and well-nourished. No distress.  HENT:  Head: Normocephalic and atraumatic.  Right Ear: Tympanic membrane and ear canal normal.  Left Ear: Tympanic membrane and ear canal normal.  Nose: Right sinus exhibits no maxillary sinus  tenderness. Left sinus exhibits no maxillary sinus tenderness.  Mouth/Throat: Uvula is midline and oropharynx is clear and moist.  Eyes: Conjunctivae and EOM are normal. Right eye exhibits no discharge. Left eye exhibits no discharge. No scleral icterus.  Neck: Normal range of motion. Carotid bruit is not present. No erythema present. No thyromegaly present.  Cardiovascular: Normal rate, regular rhythm, normal heart sounds and normal pulses.  Pulmonary/Chest: Effort normal. No respiratory distress. She has decreased breath sounds. She has no wheezes. She has no rhonchi. Right breast exhibits no mass, no nipple discharge, no  skin change and no tenderness. Left breast exhibits no mass, no nipple discharge, no skin change and no tenderness.  Abdominal: Soft. Bowel sounds are normal. There is no hepatosplenomegaly. There is no tenderness. There is no CVA tenderness.  Musculoskeletal: Normal range of motion.       Lumbar back: She exhibits tenderness and spasm.  Hamstring muscles very tight   Lymphadenopathy:    She has no cervical adenopathy.    She has no axillary adenopathy.  Neurological: She is alert and oriented to person, place, and time. She has normal strength. No cranial nerve deficit or sensory deficit. Gait (uses cane) abnormal.  Reflex Scores:      Bicep reflexes are 2+ on the right side and 2+ on the left side.      Achilles reflexes are 1+ on the right side and 1+ on the left side. Skin: Skin is warm, dry and intact. No rash noted.  Psychiatric: She has a normal mood and affect. Her speech is normal and behavior is normal. Thought content normal. Cognition and memory are normal.  Nursing note and vitals reviewed.   BP 124/78 (BP Location: Right Arm, Patient Position: Sitting, Cuff Size: Normal)   Pulse 71   Ht '5\' 6"'$  (1.676 m)   Wt 172 lb (78 kg)   LMP 09/27/1995   SpO2 95%   BMI 27.76 kg/m   Assessment and Plan: 1. Annual physical exam Recommend healthy diet Unable to exercise due to back pain - POCT urinalysis dipstick  2. Breast cancer screening Recommend scheduling in the near future - MM 3D SCREEN BREAST BILATERAL; Future  3. Hyperlipidemia associated with type 2 diabetes mellitus ([place]) On statin therapy - Lipid panel  4. Controlled type 2 diabetes mellitus without complication, without long-term current use of insulin ([facility]) Check labs and advise Schedule DM eye exam - metFORMIN (GLUCOPHAGE-XR) 500 MG 24 hr tablet; Take 1 tablet (500 mg total) by mouth daily with breakfast.  Dispense: 90 tablet; Refill: 1 - Comprehensive metabolic panel - Hemoglobin A1c - TSH  5. Essential  (primary) hypertension controlled - losartan-hydrochlorothiazide (HYZAAR) 100-25 MG tablet; Take 1 tablet by mouth daily.  Dispense: 90 tablet; Refill: 1 - metoprolol succinate (TOPROL-XL) 50 MG 24 hr tablet; Take 3 tablets (150 mg total) by mouth daily. Take with or immediately following a meal.  Dispense: 135 tablet; Refill: 1 - CBC with Differential/Platelet  6. 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 Adenomatous polyp of colon, unspecified part of colon Pt urged to schedule follow up; check with insurance on covered locations - Ambulatory referral to Gastroenterology  8. Acute idiopathic gout of right foot Increase allopurinol to 300 mg per day - allopurinol (ZYLOPRIM) 300 MG tablet; Take 1 tablet (300 mg total) by mouth daily.  Dispense: 90 tablet; Refill: 1 - Uric acid  9. Gastroesophageal reflux disease without esophagitis Controlled on PPI   Meds  ordered this encounter  Medications  . losartan-hydrochlorothiazide (HYZAAR) 100-25 MG tablet    Sig: Take 1 tablet by mouth daily.    Dispense:  90 tablet    Refill:  1  . metFORMIN (GLUCOPHAGE-XR) 500 MG 24 hr tablet    Sig: Take 1 tablet (500 mg total) by mouth daily with breakfast.    Dispense:  90 tablet    Refill:  1  . metoprolol succinate (TOPROL-XL) 50 MG 24 hr tablet    Sig: Take 3 tablets (150 mg total) by mouth daily. Take with or immediately following a meal.    Dispense:  135 tablet    Refill:  1  . omeprazole (PRILOSEC) 20 MG capsule    Sig: Take 1 capsule (20 mg total) by mouth 2 (two) times daily before a meal.    Dispense:  180 capsule    Refill:  1  . allopurinol (ZYLOPRIM) 300 MG tablet    Sig: Take 1 tablet (300 mg total) by mouth daily.    Dispense:  90 tablet    Refill:  1    Partially dictated using Editor, commissioning. Any errors are unintentional.  Halina Maidens, MD [facility]  02/28/2018

## 2018-03-01 LAB — URIC ACID: URIC ACID: 4.7 mg/dL (ref 2.5–7.1)

## 2018-03-01 LAB — CBC WITH DIFFERENTIAL/PLATELET
BASOS ABS: 0.1 10*3/uL (ref 0.0–0.2)
Basos: 1 %
EOS (ABSOLUTE): 0.2 10*3/uL (ref 0.0–0.4)
Eos: 3 %
HEMATOCRIT: 36.9 % (ref 34.0–46.6)
HEMOGLOBIN: 12 g/dL (ref 11.1–15.9)
Immature Grans (Abs): 0 10*3/uL (ref 0.0–0.1)
Immature Granulocytes: 0 %
LYMPHS ABS: 2.1 10*3/uL (ref 0.7–3.1)
Lymphs: 29 %
MCH: 31.5 pg (ref 26.6–33.0)
MCHC: 32.5 g/dL (ref 31.5–35.7)
MCV: 97 fL (ref 79–97)
MONOCYTES: 20 %
Monocytes Absolute: 1.5 10*3/uL — ABNORMAL HIGH (ref 0.1–0.9)
NEUTROS ABS: 3.3 10*3/uL (ref 1.4–7.0)
Neutrophils: 47 %
Platelets: 351 10*3/uL (ref 150–450)
RBC: 3.81 x10E6/uL (ref 3.77–5.28)
RDW: 16.5 % — ABNORMAL HIGH (ref 12.3–15.4)
WBC: 7.2 10*3/uL (ref 3.4–10.8)

## 2018-03-01 LAB — COMPREHENSIVE METABOLIC PANEL
ALK PHOS: 86 IU/L (ref 39–117)
ALT: 26 IU/L (ref 0–32)
AST: 22 IU/L (ref 0–40)
Albumin/Globulin Ratio: 1.6 (ref 1.2–2.2)
Albumin: 4.6 g/dL (ref 3.6–4.8)
BILIRUBIN TOTAL: 0.4 mg/dL (ref 0.0–1.2)
BUN / CREAT RATIO: 17 (ref 12–28)
BUN: 12 mg/dL (ref 8–27)
CHLORIDE: 95 mmol/L — AB (ref 96–106)
CO2: 26 mmol/L (ref 20–29)
Calcium: 10.1 mg/dL (ref 8.7–10.3)
Creatinine, Ser: 0.69 mg/dL (ref 0.57–1.00)
GFR calc Af Amer: 106 mL/min/{1.73_m2} (ref >59)
GFR calc non Af Amer: 92 mL/min/{1.73_m2} (ref >59)
GLOBULIN, TOTAL: 2.9 g/dL (ref 1.5–4.5)
GLUCOSE: 104 mg/dL — AB (ref 65–99)
POTASSIUM: 4.4 mmol/L (ref 3.5–5.2)
SODIUM: 137 mmol/L (ref 134–144)
Total Protein: 7.5 g/dL (ref 6.0–8.5)

## 2018-03-01 LAB — LIPID PANEL
CHOL/HDL RATIO: 3 ratio (ref 0.0–4.4)
Cholesterol, Total: 111 mg/dL (ref 100–199)
HDL: 37 mg/dL — ABNORMAL LOW (ref >39)
LDL Calculated: 39 mg/dL (ref 0–99)
Triglycerides: 176 mg/dL — ABNORMAL HIGH (ref 0–149)
VLDL Cholesterol Cal: 35 mg/dL (ref 5–40)

## 2018-03-01 LAB — TSH: TSH: 2 u[IU]/mL (ref 0.450–4.500)

## 2018-03-01 LAB — HEMOGLOBIN A1C
Est. average glucose Bld gHb Est-mCnc: 128 mg/dL
HEMOGLOBIN A1C: 6.1 % — AB (ref 4.8–5.6)

## 2018-03-07 ENCOUNTER — Telehealth: Payer: Self-pay

## 2018-03-07 NOTE — Telephone Encounter (Signed)
Did PA with [email or web address] for patient's Metoprolol 50 mg tablets TID. Awaiting faxed approval from her insurance. Could take 72 hours to give outcome.

## 2018-04-10 ENCOUNTER — Other Ambulatory Visit: Payer: Self-pay | Admitting: Internal Medicine

## 2018-04-10 DIAGNOSIS — E785 Hyperlipidemia, unspecified: Principal | ICD-10-CM

## 2018-04-10 DIAGNOSIS — E1169 Type 2 diabetes mellitus with other specified complication: Secondary | ICD-10-CM

## 2018-04-20 ENCOUNTER — Ambulatory Visit (INDEPENDENT_AMBULATORY_CARE_PROVIDER_SITE_OTHER): Payer: Medicare PPO

## 2018-04-20 DIAGNOSIS — Z23 Encounter for immunization: Secondary | ICD-10-CM

## 2018-05-08 ENCOUNTER — Other Ambulatory Visit: Payer: Self-pay | Admitting: Internal Medicine

## 2018-05-08 DIAGNOSIS — I10 Essential (primary) hypertension: Secondary | ICD-10-CM

## 2018-06-07 ENCOUNTER — Encounter: Payer: Self-pay | Admitting: Internal Medicine

## 2018-06-07 ENCOUNTER — Ambulatory Visit (INDEPENDENT_AMBULATORY_CARE_PROVIDER_SITE_OTHER): Payer: Medicare PPO | Admitting: Internal Medicine

## 2018-06-07 VITALS — BP 140/70 | HR 115 | Temp 98.3°F | Ht 66.0 in | Wt 173.0 lb

## 2018-06-07 DIAGNOSIS — R05 Cough: Secondary | ICD-10-CM

## 2018-06-07 DIAGNOSIS — R059 Cough, unspecified: Secondary | ICD-10-CM

## 2018-06-07 DIAGNOSIS — J0100 Acute maxillary sinusitis, unspecified: Secondary | ICD-10-CM | POA: Diagnosis not present

## 2018-06-07 DIAGNOSIS — M170 Bilateral primary osteoarthritis of knee: Secondary | ICD-10-CM | POA: Diagnosis not present

## 2018-06-07 MED ORDER — GUAIFENESIN-CODEINE 100-10 MG/5ML PO SYRP: 5.0000 mL | ORAL_SOLUTION | Freq: Every evening | ORAL | 0 refills | Status: DC | PRN

## 2018-06-07 MED ORDER — CELECOXIB 200 MG PO CAPS: 200.0000 mg | ORAL_CAPSULE | Freq: Two times a day (BID) | ORAL | 0 refills | Status: DC

## 2018-06-07 MED ORDER — AMOXICILLIN-POT CLAVULANATE 875-125 MG PO TABS: 1.0000 | ORAL_TABLET | Freq: Two times a day (BID) | ORAL | 0 refills | Status: AC

## 2018-06-07 NOTE — Progress Notes (Signed)
Date:  06/07/2018   Name:  Kiara Campbell   DOB:  1956-01-23   MRN:  [id number]   Chief Complaint: Cough (X 1 week. Green production with cough. No chills or fever. Sinus headaches. )  Sinusitis  This is a new problem. The current episode started in the past 7 days. The problem has been gradually worsening since onset. There has been no fever. The pain is mild. Associated symptoms include congestion, coughing, sinus pressure and a sore throat. Pertinent negatives include no chills, diaphoresis, headaches or shortness of breath. Past treatments include spray decongestants. The treatment provided mild relief.  Knee Pain   The pain is present in the left knee (right knee surgery much improved sx). The pain is moderate. The pain has been fluctuating since onset. She has tried acetaminophen and NSAIDs (taking tramadol bid - would like to try celebrex again) for the symptoms.    Review of Systems  Constitutional: Negative for chills, diaphoresis and fever.  HENT: Positive for congestion, postnasal drip, sinus pressure and sore throat. Negative for trouble swallowing.   Respiratory: Positive for cough. Negative for chest tightness, shortness of breath and wheezing.   Cardiovascular: Negative for chest pain, palpitations and leg swelling.  Musculoskeletal: Positive for arthralgias, back pain and gait problem.  Allergic/Immunologic: Negative for environmental allergies.  Neurological: Negative for headaches.    Patient Active Problem List   Diagnosis Date Noted  . Tobacco use disorder, severe, in early remission 08/09/2017  . Primary osteoarthritis of both knees 08/01/2016  . Controlled type 2 diabetes mellitus without complication, without long-term current use of insulin ([place]) 06/02/2015  . Fibrocystic breast disease 12/03/2014  . Colon polyp 12/03/2014  . Diverticulitis of colon 12/03/2014  . Essential (primary) hypertension 12/03/2014  . Lumbar disc herniation with radiculopathy  12/03/2014  . OP (osteoporosis) 12/03/2014  . Hyperlipidemia associated with type 2 diabetes mellitus ([place]) 12/03/2014  . Calcium blood increased 12/23/2013    Allergies  Allergen Reactions  . Glipizide Nausea Only    Past Surgical History:  Procedure Laterality Date  . ABDOMINAL HYSTERECTOMY     partial  . APPENDECTOMY    . BREAST CYST ASPIRATION     neg not sure which side  . 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 TONSILLECTOMY    . VARICOSE VEIN SURGERY      Social History   Tobacco Use  . Smoking status: Current Some Day Smoker    Packs/day: 0.50    Years: 30.00    Pack years: 15.00    Types: Cigarettes  . Smokeless tobacco: Current User  Substance Use Topics  . Alcohol use: Yes    Comment: occasional  . Drug use: No     Medication list has been reviewed and updated.  Current Meds  Medication Sig  . ACCU-CHEK SOFTCLIX LANCETS lancets Use as instructed  . Alcohol Swabs (B-D SINGLE USE SWABS REGULAR) PADS USE DAILY.  Marland Kitchen allopurinol (ZYLOPRIM) 300 MG tablet Take 1 tablet (300 mg total) by mouth daily.  . Blood Glucose Monitoring Suppl (TRUE METRIX AIR GLUCOSE METER) DEVI 1 each by Does not apply route daily.  . Calcium Carb-Cholecalciferol (CALCIUM + D3) 600-200 MG-UNIT TABS Take 1 tablet by mouth daily.  . colchicine (COLCRYS) 0.6 MG tablet Take 1 tablet (0.6 mg total) by mouth 2 (two) times daily.  . fexofenadine (ALLEGRA) 60 MG tablet Take 60 mg by mouth 2 (two) times daily.  . fluticasone (FLONASE) 50 MCG/ACT nasal spray   .  Lancet Devices (ACCU-CHEK SOFTCLIX) lancets 1 each by Other route daily. Use as instructed  . Lancets Misc. (ACCU-CHEK SOFTCLIX LANCET DEV) KIT 1 each by Does not apply route daily.  Marland Kitchen losartan-hydrochlorothiazide (HYZAAR) 100-25 MG tablet Take 1 tablet by mouth daily.  . metFORMIN (GLUCOPHAGE-XR) 500 MG 24 hr tablet Take 1 tablet (500 mg total) by mouth daily with breakfast.  . metoprolol succinate (TOPROL-XL) 50 MG 24 hr tablet  TAKE 3 TABLETS (150 MG) DAILY WITH OR IMMEDIATELY FOLLOWING A MEAL.  . mupirocin ointment (BACTROBAN) 2 % PLACE 1 APPLICATION INTO THE NOSE 2 TIMES A DAILY.  Marland Kitchen omeprazole (PRILOSEC) 20 MG capsule Take 1 capsule (20 mg total) by mouth 2 (two) times daily before a meal.  . psyllium (METAMUCIL SMOOTH TEXTURE) 28 % packet Take 1 packet by mouth 2 (two) times daily.  . simvastatin (ZOCOR) 40 MG tablet TAKE 1 TABLET AT BEDTIME  . traMADol (ULTRAM) 50 MG tablet TAKE 1 TABLET BY MOUTH EVERY 6 HOURS AS NEEDED  . TRUE METRIX BLOOD GLUCOSE TEST test strip TEST EVERY DAY    PHQ 2/9 Scores 02/21/2018 02/07/2017 01/28/2016  PHQ - 2 Score 0 0 0  PHQ- 9 Score 0 - -    Physical Exam  Constitutional: She is oriented to person, place, and time. She appears well-developed and well-nourished.  HENT:  Right Ear: External ear and ear canal normal. Tympanic membrane is not erythematous and not retracted.  Left Ear: External ear and ear canal normal. Tympanic membrane is not erythematous and not retracted.  Nose: Right sinus exhibits maxillary sinus tenderness and frontal sinus tenderness. Left sinus exhibits maxillary sinus tenderness and frontal sinus tenderness.  Mouth/Throat: Uvula is midline and mucous membranes are normal. No oral lesions. Posterior oropharyngeal erythema present. No oropharyngeal exudate.  Cardiovascular: Normal rate, regular rhythm and normal heart sounds.  Pulmonary/Chest: She has decreased breath sounds. She has no wheezes. She has no rhonchi. She has no rales.  Musculoskeletal:       Left knee: She exhibits decreased range of motion. She exhibits no effusion. Tenderness found.  Lymphadenopathy:    She has no cervical adenopathy.  Neurological: She is alert and oriented to person, place, and time.    BP 140/70 (BP Location: Right Arm, Patient Position: Sitting, Cuff Size: Normal)   Pulse (!) 115   Temp 98.3 F (36.8 C) (Oral)   Ht '5\' 6"'$  (1.676 m)   Wt 173 lb (78.5 kg)   LMP  09/27/1995   SpO2 97%   BMI 27.92 kg/m   Assessment and Plan: 1. Acute non-recurrent maxillary sinusitis Continue flonase - amoxicillin-clavulanate (AUGMENTIN) 875-125 MG tablet; Take 1 tablet by mouth 2 (two) times daily for 10 days.  Dispense: 20 tablet; Refill: 0  2. Cough Take at night to aid sleep - guaiFENesin-codeine (ROBITUSSIN AC) 100-10 MG/5ML syrup; Take 5 mLs by mouth at bedtime as needed for cough.  Dispense: 118 mL; Refill: 0  3. Primary osteoarthritis of both knees Resume Celebrex bid Reduce use of Tramadol - celecoxib (CELEBREX) 200 MG capsule; Take 1 capsule (200 mg total) by mouth 2 (two) times daily.  Dispense: 60 capsule; Refill: 0   Partially dictated using Editor, commissioning. Any errors are unintentional.  Halina Maidens, MD [facility]  06/07/2018

## 2018-06-15 ENCOUNTER — Telehealth: Payer: Self-pay

## 2018-06-15 NOTE — Telephone Encounter (Signed)
Patient LM stating her meds are not working for sinus inf. Advised finish entire RX and then give couple days to work

## 2018-07-02 ENCOUNTER — Ambulatory Visit (INDEPENDENT_AMBULATORY_CARE_PROVIDER_SITE_OTHER): Payer: Medicare PPO | Admitting: Internal Medicine

## 2018-07-02 ENCOUNTER — Encounter: Payer: Self-pay | Admitting: Internal Medicine

## 2018-07-02 VITALS — BP 122/76 | HR 69 | Temp 98.1°F | Ht 66.0 in | Wt 176.0 lb

## 2018-07-02 DIAGNOSIS — J0100 Acute maxillary sinusitis, unspecified: Secondary | ICD-10-CM

## 2018-07-02 DIAGNOSIS — E119 Type 2 diabetes mellitus without complications: Secondary | ICD-10-CM

## 2018-07-02 DIAGNOSIS — I10 Essential (primary) hypertension: Secondary | ICD-10-CM | POA: Diagnosis not present

## 2018-07-02 DIAGNOSIS — M170 Bilateral primary osteoarthritis of knee: Secondary | ICD-10-CM

## 2018-07-02 MED ORDER — SULFAMETHOXAZOLE-TRIMETHOPRIM 800-160 MG PO TABS: 1.0000 | ORAL_TABLET | Freq: Two times a day (BID) | ORAL | 0 refills | Status: AC

## 2018-07-02 MED ORDER — PREDNISONE 10 MG PO TABS: 10.0000 mg | ORAL_TABLET | ORAL | 0 refills | Status: AC

## 2018-07-02 MED ORDER — CELECOXIB 200 MG PO CAPS: 200.0000 mg | ORAL_CAPSULE | Freq: Two times a day (BID) | ORAL | 3 refills | Status: DC

## 2018-07-02 NOTE — Progress Notes (Signed)
Date:  07/02/2018   Name:  Kiara Campbell   DOB:  Nov 30, 1955   MRN:  [id number]   Chief Complaint: Diabetes; Hypertension; and Sinusitis (Still having sinus issues. Has not went away. Started a month ago and was seen for OV. Coughing- dark beige color. No fever or body aches. )  Diabetes  She presents for her follow-up diabetic visit. She has type 2 diabetes mellitus. Associated symptoms include fatigue. Pertinent negatives for diabetes include no chest pain. Symptoms are stable. Current diabetic treatment includes oral agent (monotherapy). She is compliant with treatment all of the time. She monitors blood glucose at home 1-2 x per week. There is no change in her home blood glucose trend. Her breakfast blood glucose is taken between 7-8 am. Her breakfast blood glucose range is generally 110-130 mg/dl. An ACE inhibitor/angiotensin II receptor blocker is being taken.  Hypertension  The problem is controlled. Pertinent negatives include no chest pain, palpitations or shortness of breath.  Knee Pain   There was no injury mechanism. The pain is present in the right knee. The pain is moderate. The pain has been improving since onset. She has tried NSAIDs (celebrex started last visit has been very beneficial) for the symptoms.  Sinusitis  This is a recurrent problem. The current episode started 1 to 4 weeks ago. The problem is unchanged. There has been no fever. The pain is mild. Associated symptoms include congestion, coughing and sinus pressure. Pertinent negatives include no chills, ear pain, hoarse voice, shortness of breath or swollen glands. Past treatments include antibiotics and spray decongestants. The treatment provided no relief.  She completed augmentin 10 day course about 2 weeks ago and still has thick dark sinus drainage and cough.  Review of Systems  Constitutional: Positive for fatigue. Negative for chills, fever and unexpected weight change.  HENT: Positive for congestion,  postnasal drip, sinus pressure and sinus pain. Negative for ear pain, hoarse voice and trouble swallowing.   Eyes: Negative for visual disturbance.  Respiratory: Positive for cough. Negative for chest tightness, shortness of breath and wheezing.   Cardiovascular: Negative for chest pain, palpitations and leg swelling.  Gastrointestinal: Negative for abdominal pain, constipation and diarrhea.  Genitourinary: Negative for dysuria.  Musculoskeletal: Positive for arthralgias (knee pain much better controlled).  Allergic/Immunologic: Negative for environmental allergies.  Hematological: Negative for adenopathy.  Psychiatric/Behavioral: Negative for sleep disturbance.    Patient Active Problem List   Diagnosis Date Noted  . Tobacco use disorder, severe, in early remission 08/09/2017  . Emphysema lung ([place]) 07/28/2017  . Obesity (BMI 30-39.9) 07/17/2017  . GERD (gastroesophageal reflux disease) 07/12/2017  . Primary osteoarthritis of both knees 08/01/2016  . Controlled type 2 diabetes mellitus without complication, without long-term current use of insulin ([place]) 06/02/2015  . Fibrocystic breast disease 12/03/2014  . Colon polyp 12/03/2014  . Diverticulitis of colon 12/03/2014  . Essential (primary) hypertension 12/03/2014  . 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 OP (osteoporosis) 12/03/2014  . Hyperlipidemia associated with type 2 diabetes mellitus ([place]) 12/03/2014  . Calcium blood increased 12/23/2013    Allergies  Allergen Reactions  . Glipizide Nausea Only    Past Surgical History:  Procedure Laterality Date  . ABDOMINAL HYSTERECTOMY     partial  . APPENDECTOMY    . BREAST CYST ASPIRATION     neg not sure which side  . 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 TONSILLECTOMY    . VARICOSE VEIN SURGERY  Social History   Tobacco Use  . Smoking status: Current Some Day Smoker    Packs/day: 0.50    Years: 30.00    Pack years: 15.00    Types:  Cigarettes  . Smokeless tobacco: Current User  Substance Use Topics  . Alcohol use: Yes    Comment: occasional  . Drug use: No     Medication list has been reviewed and updated.  Current Meds  Medication Sig  . ACCU-CHEK SOFTCLIX LANCETS lancets Use as instructed  . Alcohol Swabs (B-D SINGLE USE SWABS REGULAR) PADS USE DAILY.  Marland Kitchen allopurinol (ZYLOPRIM) 300 MG tablet Take 1 tablet (300 mg total) by mouth daily.  . Blood Glucose Monitoring Suppl (TRUE METRIX AIR GLUCOSE METER) DEVI 1 each by Does not apply route daily.  . Calcium Carb-Cholecalciferol (CALCIUM + D3) 600-200 MG-UNIT TABS Take 1 tablet by mouth daily.  . celecoxib (CELEBREX) 200 MG capsule Take 1 capsule (200 mg total) by mouth 2 (two) times daily.  . colchicine (COLCRYS) 0.6 MG tablet Take 1 tablet (0.6 mg total) by mouth 2 (two) times daily.  . fexofenadine (ALLEGRA) 60 MG tablet Take 60 mg by mouth 2 (two) times daily.  . fluticasone (FLONASE) 50 MCG/ACT nasal spray   . Lancet Devices (ACCU-CHEK SOFTCLIX) lancets 1 each by Other route daily. Use as instructed  . Lancets Misc. (ACCU-CHEK SOFTCLIX LANCET DEV) KIT 1 each by Does not apply route daily.  Marland Kitchen losartan-hydrochlorothiazide (HYZAAR) 100-25 MG tablet Take 1 tablet by mouth daily.  . metFORMIN (GLUCOPHAGE-XR) 500 MG 24 hr tablet Take 1 tablet (500 mg total) by mouth daily with breakfast.  . metoprolol succinate (TOPROL-XL) 50 MG 24 hr tablet TAKE 3 TABLETS (150 MG) DAILY WITH OR IMMEDIATELY FOLLOWING A MEAL.  . mupirocin ointment (BACTROBAN) 2 % PLACE 1 APPLICATION INTO THE NOSE 2 TIMES A DAILY.  Marland Kitchen omeprazole (PRILOSEC) 20 MG capsule Take 1 capsule (20 mg total) by mouth 2 (two) times daily before a meal.  . psyllium (METAMUCIL SMOOTH TEXTURE) 28 % packet Take 1 packet by mouth 2 (two) times daily.  . simvastatin (ZOCOR) 40 MG tablet TAKE 1 TABLET AT BEDTIME  . traMADol (ULTRAM) 50 MG tablet TAKE 1 TABLET BY MOUTH EVERY 6 HOURS AS NEEDED  . TRUE METRIX BLOOD GLUCOSE  TEST test strip TEST EVERY DAY    PHQ 2/9 Scores 02/21/2018 02/07/2017 01/28/2016  PHQ - 2 Score 0 0 0  PHQ- 9 Score 0 - -    Physical Exam Constitutional:      Appearance: She is well-developed.  HENT:     Right Ear: Ear canal and external ear normal. Tympanic membrane is not erythematous or retracted.     Left Ear: Ear canal and external ear normal. Tympanic membrane is not erythematous or retracted.     Nose:     Right Sinus: Maxillary sinus tenderness and frontal sinus tenderness present.     Left Sinus: Maxillary sinus tenderness and frontal sinus tenderness present.     Mouth/Throat:     Mouth: No oral lesions.     Pharynx: Uvula midline. No oropharyngeal exudate or posterior oropharyngeal erythema.  Cardiovascular:     Rate and Rhythm: Normal rate and regular rhythm.     Heart sounds: Normal heart sounds.  Pulmonary:     Effort: Pulmonary effort is normal.     Breath sounds: Decreased breath sounds present. No wheezing or rales.  Lymphadenopathy:     Cervical: No cervical adenopathy.  Neurological:  Mental Status: She is alert and oriented to person, place, and time.     BP 122/76 (BP Location: Right Arm, Patient Position: Sitting, Cuff Size: Normal)   Pulse 69   Temp 98.1 F (36.7 C) (Oral)   Ht _0  (1.676 m)   Wt 176 lb (79.8 kg)   LMP 09/27/1995   SpO2 95%   BMI 28.41 kg/m   Assessment and Plan: 1. Controlled type 2 diabetes mellitus without complication, without long-term current use of insulin (HCC) Continue metformin - Hemoglobin A1c  2. Acute non-recurrent maxillary sinusitis Continue Flonase - predniSONE (DELTASONE) 10 MG tablet; Take 1 tablet (10 mg total) by mouth as directed for 6 days. Take 6,5,4,3,2,1 then stop  Dispense: 21 tablet; Refill: 0 - sulfamethoxazole-trimethoprim (BACTRIM DS,SEPTRA DS) 800-160 MG tablet; Take 1 tablet by mouth 2 (two) times daily for 10 days.  Dispense: 20 tablet; Refill: 0  3. Essential (primary)  hypertension controlled - Basic metabolic panel  4. Primary osteoarthritis of both knees Continue celebrex - celecoxib (CELEBREX) 200 MG capsule; Take 1 capsule (200 mg total) by mouth 2 (two) times daily.  Dispense: 180 capsule; Refill: 3   Partially dictated using Editor, commissioning. Any errors are unintentional.  Halina Maidens, MD [facility]  07/02/2018

## 2018-07-03 LAB — BASIC METABOLIC PANEL
BUN/Creatinine Ratio: 18 (ref 12–28)
BUN: 15 mg/dL (ref 8–27)
CALCIUM: 9.6 mg/dL (ref 8.7–10.3)
CHLORIDE: 102 mmol/L (ref 96–106)
CO2: 25 mmol/L (ref 20–29)
Creatinine, Ser: 0.82 mg/dL (ref 0.57–1.00)
GFR calc Af Amer: 87 mL/min/{1.73_m2} (ref >59)
GFR calc non Af Amer: 76 mL/min/{1.73_m2} (ref >59)
Glucose: 117 mg/dL — ABNORMAL HIGH (ref 65–99)
Potassium: 4.6 mmol/L (ref 3.5–5.2)
Sodium: 142 mmol/L (ref 134–144)

## 2018-07-03 LAB — HEMOGLOBIN A1C
Est. average glucose Bld gHb Est-mCnc: 131 mg/dL
Hgb A1c MFr Bld: 6.2 % — ABNORMAL HIGH (ref 4.8–5.6)

## 2018-08-09 ENCOUNTER — Other Ambulatory Visit: Payer: Self-pay

## 2018-08-09 ENCOUNTER — Ambulatory Visit
Admission: EM | Admit: 2018-08-09 | Discharge: 2018-08-09 | Disposition: A | Discharge status: Other Acute Inpatient Hospital | Payer: Medicare PPO | Attending: Family Medicine | Admitting: Family Medicine

## 2018-08-09 ENCOUNTER — Encounter: Payer: Self-pay | Admitting: Emergency Medicine

## 2018-08-09 DIAGNOSIS — R0689 Other abnormalities of breathing: Secondary | ICD-10-CM | POA: Diagnosis not present

## 2018-08-09 DIAGNOSIS — J329 Chronic sinusitis, unspecified: Secondary | ICD-10-CM | POA: Diagnosis not present

## 2018-08-09 DIAGNOSIS — J189 Pneumonia, unspecified organism: Secondary | ICD-10-CM | POA: Diagnosis not present

## 2018-08-09 DIAGNOSIS — R0902 Hypoxemia: Secondary | ICD-10-CM | POA: Diagnosis not present

## 2018-08-09 DIAGNOSIS — R0682 Tachypnea, not elsewhere classified: Secondary | ICD-10-CM | POA: Diagnosis not present

## 2018-08-09 DIAGNOSIS — J9601 Acute respiratory failure with hypoxia: Secondary | ICD-10-CM | POA: Diagnosis not present

## 2018-08-09 DIAGNOSIS — R509 Fever, unspecified: Secondary | ICD-10-CM | POA: Diagnosis not present

## 2018-08-09 DIAGNOSIS — R918 Other nonspecific abnormal finding of lung field: Secondary | ICD-10-CM | POA: Diagnosis not present

## 2018-08-09 DIAGNOSIS — J101 Influenza due to other identified influenza virus with other respiratory manifestations: Secondary | ICD-10-CM | POA: Diagnosis not present

## 2018-08-09 DIAGNOSIS — I5030 Unspecified diastolic (congestive) heart failure: Secondary | ICD-10-CM | POA: Diagnosis not present

## 2018-08-09 DIAGNOSIS — Z87891 Personal history of nicotine dependence: Secondary | ICD-10-CM | POA: Diagnosis not present

## 2018-08-09 DIAGNOSIS — R0602 Shortness of breath: Secondary | ICD-10-CM | POA: Diagnosis not present

## 2018-08-09 DIAGNOSIS — R069 Unspecified abnormalities of breathing: Secondary | ICD-10-CM | POA: Diagnosis not present

## 2018-08-09 DIAGNOSIS — I110 Hypertensive heart disease with heart failure: Secondary | ICD-10-CM | POA: Diagnosis not present

## 2018-08-09 DIAGNOSIS — J9691 Respiratory failure, unspecified with hypoxia: Secondary | ICD-10-CM | POA: Diagnosis not present

## 2018-08-09 DIAGNOSIS — R05 Cough: Secondary | ICD-10-CM | POA: Diagnosis not present

## 2018-08-09 NOTE — ED Provider Notes (Signed)
[facility]    CSN: [id number] Arrival date & time: 08/09/18  1326  History   Chief Complaint Chief Complaint  Patient presents with  . Cough  . Generalized Body Aches   HPI  63 year old female presents with cough, fever, body aches.  Patient states she has been sick since Sunday.  She reports that she has had severe cough and associated diarrhea.  Body aches. Severe. No documented fever at home.  She is currently febrile.  Patient is a longtime smoker.  Her chart indicates that she has emphysema.  I can find no record of this.  Patient currently tachypneic and ill-appearing.  Arrived hypoxic satting 86% on room air.  She reports that she is recently been exposed to someone with pneumonia.  No other reported symptoms.  No other complaints.  PMH, Surgical Hx, Family Hx, Social History reviewed and updated as below.  Past Medical History:  Diagnosis Date  . Diabetes mellitus without complication ([place])   . Hyperlipidemia   . Hypertension    Patient Active Problem List   Diagnosis Date Noted  . Tobacco use disorder, severe, in early remission 08/09/2017  . Emphysema lung ([place]) 07/28/2017  . Obesity (BMI 30-39.9) 07/17/2017  . GERD (gastroesophageal reflux disease) 07/12/2017  . Primary osteoarthritis of both knees 08/01/2016  . Controlled type 2 diabetes mellitus without complication, without long-term current use of insulin ([place]) 06/02/2015  . Fibrocystic breast disease 12/03/2014  . Colon polyp 12/03/2014  . Diverticulitis of colon 12/03/2014  . Essential (primary) hypertension 12/03/2014  . 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 OP (osteoporosis) 12/03/2014  . Hyperlipidemia associated with type 2 diabetes mellitus ([place]) 12/03/2014  . Calcium blood increased 12/23/2013   Past Surgical History:  Procedure Laterality Date  . ABDOMINAL HYSTERECTOMY     partial  . APPENDECTOMY    . BREAST CYST ASPIRATION     neg not sure which side  .  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 TONSILLECTOMY    . VARICOSE VEIN SURGERY     OB History   No obstetric history on file.    Home Medications    Prior to Admission medications   Medication Sig Start Date End Date Taking? Authorizing Provider  ACCU-CHEK SOFTCLIX LANCETS lancets Use as instructed 09/21/15  Yes Glean Hess, MD  Alcohol Swabs (B-D SINGLE USE SWABS REGULAR) PADS USE DAILY. 09/20/17  Yes Glean Hess, MD  allopurinol (ZYLOPRIM) 300 MG tablet Take 1 tablet (300 mg total) by mouth daily. 02/28/18  Yes Glean Hess, MD  Blood Glucose Monitoring Suppl (TRUE METRIX AIR GLUCOSE METER) DEVI 1 each by Does not apply route daily. 07/29/15  Yes Glean Hess, MD  Calcium Carb-Cholecalciferol (CALCIUM + D3) 600-200 MG-UNIT TABS Take 1 tablet by mouth daily.   Yes [provider]  celecoxib (CELEBREX) 200 MG capsule Take 1 capsule (200 mg total) by mouth 2 (two) times daily. 07/02/18  Yes Glean Hess, MD  colchicine (COLCRYS) 0.6 MG tablet Take 1 tablet (0.6 mg total) by mouth 2 (two) times daily. 12/28/17  Yes Glean Hess, MD  fexofenadine (ALLEGRA) 60 MG tablet Take 60 mg by mouth 2 (two) times daily.   Yes [provider]  fluticasone Asencion Islam) 50 MCG/ACT nasal spray  02/12/15  Yes [provider]  Lancet Devices [facility]) lancets 1 each by Other route daily. Use as instructed 03/12/15  Yes Glean Hess, MD  Lancets Misc. ([place]  DEV) KIT 1 each by Does not apply route daily. 09/21/15  Yes Glean Hess, MD  losartan-hydrochlorothiazide (HYZAAR) 100-25 MG tablet Take 1 tablet by mouth daily. 02/28/18  Yes Glean Hess, MD  metFORMIN (GLUCOPHAGE-XR) 500 MG 24 hr tablet Take 1 tablet (500 mg total) by mouth daily with breakfast. 02/28/18  Yes Glean Hess, MD  metoprolol succinate (TOPROL-XL) 50 MG 24 hr tablet TAKE 3 TABLETS (150 MG) DAILY WITH OR IMMEDIATELY FOLLOWING A MEAL. 05/08/18   Yes Glean Hess, MD  omeprazole (PRILOSEC) 20 MG capsule Take 1 capsule (20 mg total) by mouth 2 (two) times daily before a meal. 02/28/18  Yes Glean Hess, MD  psyllium (METAMUCIL SMOOTH TEXTURE) 28 % packet Take 1 packet by mouth 2 (two) times daily.   Yes [provider]  simvastatin (ZOCOR) 40 MG tablet TAKE 1 TABLET AT BEDTIME 04/11/18  Yes Glean Hess, MD  traMADol (ULTRAM) 50 MG tablet TAKE 1 TABLET BY MOUTH EVERY 6 HOURS AS NEEDED 02/26/18  Yes Glean Hess, MD  mupirocin ointment (BACTROBAN) 2 % PLACE 1 APPLICATION INTO THE NOSE 2 TIMES A DAILY. 10/20/15   Glean Hess, MD  TRUE METRIX BLOOD GLUCOSE TEST test strip TEST EVERY DAY 11/07/16   Glean Hess, MD   Family History Family History  Problem Relation Age of Onset  . CAD Father        died age 66  . COPD Mother   . Diabetes Maternal Grandmother   . Breast cancer Neg Hx    Social History Social History   Tobacco Use  . Smoking status: Current Some Day Smoker    Packs/day: 0.50    Years: 30.00    Pack years: 15.00    Types: Cigarettes  . Smokeless tobacco: Current User  Substance Use Topics  . Alcohol use: Yes    Comment: occasional  . Drug use: No   Allergies   Glipizide  Review of Systems Review of Systems  Respiratory: Positive for cough.   Gastrointestinal: Positive for diarrhea.   Physical Exam Triage Vital Signs ED Triage Vitals  Enc Vitals Group     BP 08/09/18 1341 136/70     Pulse Rate 08/09/18 1341 95     Resp 08/09/18 1341 (!) 24     Temp 08/09/18 1341 (!) 100.9 F (38.3 C)     Temp Source 08/09/18 1341 Oral     SpO2 08/09/18 1341 (!) 86 %     Weight 08/09/18 1339 170 lb (77.1 kg)     Height 08/09/18 1339 '5\' 6"'$  (1.676 m)     Head Circumference --      Peak Flow --      Pain Score 08/09/18 1338 6     Pain Loc --      Pain Edu? --      Excl. in [place]? --    Updated Vital Signs BP 136/70 (BP Location: Right Arm)   Pulse 95   Temp (!) 100.9 F (38.3 C)  (Oral)   Resp (!) 24   Ht '5\' 6"'$  (1.676 m)   Wt 77.1 kg   LMP 09/27/1995   SpO2 97%   BMI 27.44 kg/m   Visual Acuity Right Eye Distance:   Left Eye Distance:   Bilateral Distance:    Right Eye Near:   Left Eye Near:    Bilateral Near:     Physical Exam Vitals signs and nursing note reviewed.  Constitutional:  Comments: Mild tachypnea.  Appears ill.  HENT:     Head: Normocephalic and atraumatic.     Mouth/Throat:     Comments: Mouth is dry.  A lot of mucus.  Mild erythema of the oropharynx. Eyes:     General:        Right eye: No discharge.        Left eye: No discharge.     Conjunctiva/sclera: Conjunctivae normal.  Cardiovascular:     Rate and Rhythm: Normal rate and regular rhythm.  Pulmonary:     Comments: Mild increased work of breathing.  Decreased breath sounds throughout. Neurological:     Mental Status: She is alert.  Psychiatric:        Mood and Affect: Mood normal.        Behavior: Behavior normal.    UC Treatments / Results  Labs (all labs ordered are listed, but only abnormal results are displayed) Labs Reviewed - No data to display  EKG None  Radiology No results found.  Procedures Procedures (including critical care time)  Medications Ordered in UC Medications - No data to display  Initial Impression / Assessment and Plan / UC Course  I have reviewed the triage vital signs and the nursing notes.  Pertinent labs & imaging results that were available during my care of the patient were reviewed by me and considered in my medical decision making (see chart for details).    63 year old female with suspected COPD presents with hypoxia. Satting 86% on room air.  Required 2 L of oxygen here.  Given the fact that the patient is ill-appearing, tachypneic, febrile and has comorbidities and is also requiring oxygen she is going directly to the emergency room via EMS.   Final Clinical Impressions(s) / UC Diagnoses   Final diagnoses:  Acute  respiratory failure with hypoxia [facility])   Discharge Instructions   None    ED Prescriptions    None     Controlled Substance Prescriptions [place] Controlled Substance Registry consulted? Not Applicable   Coral Spikes, DO 08/09/18 1408

## 2018-08-09 NOTE — ED Triage Notes (Signed)
Patient c/o cough and body aches that started on Monday. She denies fever.

## 2018-08-10 DIAGNOSIS — J101 Influenza due to other identified influenza virus with other respiratory manifestations: Secondary | ICD-10-CM | POA: Diagnosis not present

## 2018-08-10 DIAGNOSIS — E119 Type 2 diabetes mellitus without complications: Secondary | ICD-10-CM | POA: Diagnosis not present

## 2018-08-10 DIAGNOSIS — J9601 Acute respiratory failure with hypoxia: Secondary | ICD-10-CM | POA: Diagnosis not present

## 2018-08-10 DIAGNOSIS — I10 Essential (primary) hypertension: Secondary | ICD-10-CM | POA: Diagnosis not present

## 2018-08-10 DIAGNOSIS — Z720 Tobacco use: Secondary | ICD-10-CM | POA: Diagnosis not present

## 2018-08-15 ENCOUNTER — Telehealth: Payer: Self-pay

## 2018-08-15 NOTE — Telephone Encounter (Signed)
1st attempt to reach patient for TCM and to confirm hospital follow up appt. Left msg for pt to call back at [phone].

## 2018-08-16 NOTE — Telephone Encounter (Signed)
Second attempt made to reach patient for TCM call. Left msg for patient to call back at [phone]

## 2018-08-27 ENCOUNTER — Encounter: Payer: Self-pay | Admitting: Internal Medicine

## 2018-08-27 ENCOUNTER — Ambulatory Visit: Payer: Medicare PPO | Admitting: Internal Medicine

## 2018-08-27 VITALS — BP 124/66 | HR 68 | Ht 66.0 in | Wt 183.0 lb

## 2018-08-27 DIAGNOSIS — M170 Bilateral primary osteoarthritis of knee: Secondary | ICD-10-CM

## 2018-08-27 DIAGNOSIS — J101 Influenza due to other identified influenza virus with other respiratory manifestations: Secondary | ICD-10-CM | POA: Diagnosis not present

## 2018-08-27 DIAGNOSIS — I10 Essential (primary) hypertension: Secondary | ICD-10-CM | POA: Diagnosis not present

## 2018-08-27 DIAGNOSIS — J439 Emphysema, unspecified: Secondary | ICD-10-CM

## 2018-08-27 MED ORDER — TRAMADOL HCL 50 MG PO TABS: 50.0000 mg | ORAL_TABLET | Freq: Four times a day (QID) | ORAL | 2 refills | Status: DC | PRN

## 2018-08-27 NOTE — Progress Notes (Signed)
Date:  08/27/2018   Name:  Kiara Campbell   DOB:  [birth year]   MRN:  [id number]   Chief Complaint: Hospitalization Follow-up (Dx with Flu A and Hypoxia. Coming today to follow up on this. Feeling ) Admitted to [facility] 08/09/18 to 08/10/18 with influenza A and hypoxia.  She improved quickly with a single dose of antibiotics, oxygen and nebulizers.  She was able to be discharged home the next day to continue a short course of Levaquin oral.  She declined Tamiflu due to late timing of diagnosis.  She is feeling well currently.  Knee Pain   The pain is present in the right knee. The quality of the pain is described as burning and aching. The pain is moderate. The pain has been fluctuating since onset. She has tried NSAIDs (and tramadol) for the symptoms.  Sinusitis  This is a recurrent problem. The problem has been gradually improving since onset. There has been no fever. Associated symptoms include coughing and shortness of breath. Pertinent negatives include no chills or headaches. Treatments tried: recent course of levaquin. The treatment provided significant relief.  Hypertension  This is a chronic problem. The problem is controlled. Associated symptoms include shortness of breath. Pertinent negatives include no chest pain, headaches or palpitations. Past treatments include beta blockers, diuretics and angiotensin blockers. The current treatment provides significant improvement.    Review of Systems  Constitutional: Negative for chills, fatigue and fever.  Respiratory: Positive for cough and shortness of breath. Negative for choking and wheezing.   Cardiovascular: Negative for chest pain, palpitations and leg swelling.  Musculoskeletal: Positive for arthralgias.  Neurological: Negative for dizziness and headaches.    Patient Active Problem List   Diagnosis Date Noted  . Tobacco use disorder, severe, in early remission 08/09/2017  . Emphysema lung ([place]) 07/28/2017  . Obesity (BMI 30-39.9)  07/17/2017  . GERD (gastroesophageal reflux disease) 07/12/2017  . Primary osteoarthritis of both knees 08/01/2016  . Controlled type 2 diabetes mellitus without complication, without long-term current use of insulin ([place]) 06/02/2015  . Fibrocystic breast disease 12/03/2014  . Colon polyp 12/03/2014  . Diverticulitis of colon 12/03/2014  . Essential (primary) hypertension 12/03/2014  . 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 OP (osteoporosis) 12/03/2014  . Hyperlipidemia associated with type 2 diabetes mellitus ([place]) 12/03/2014  . Calcium blood increased 12/23/2013    Allergies  Allergen Reactions  . Glipizide Nausea Only    Past Surgical History:  Procedure Laterality Date  . ABDOMINAL HYSTERECTOMY     partial  . APPENDECTOMY    . BREAST CYST ASPIRATION     neg not sure which side  . 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 TONSILLECTOMY    . VARICOSE VEIN SURGERY      Social History   Tobacco Use  . Smoking status: Current Some Day Smoker    Packs/day: 0.50    Years: 30.00    Pack years: 15.00    Types: Cigarettes  . Smokeless tobacco: Current User  Substance Use Topics  . Alcohol use: Yes    Comment: occasional  . Drug use: No     Medication list has been reviewed and updated.  Current Meds  Medication Sig  . ACCU-CHEK SOFTCLIX LANCETS lancets Use as instructed  . Alcohol Swabs (B-D SINGLE USE SWABS REGULAR) PADS USE DAILY.  Marland Kitchen allopurinol (ZYLOPRIM) 300 MG tablet Take 1 tablet (300 mg total) by mouth daily.  . Blood Glucose Monitoring Suppl (TRUE METRIX  AIR GLUCOSE METER) DEVI 1 each by Does not apply route daily.  . Calcium Carb-Cholecalciferol (CALCIUM + D3) 600-200 MG-UNIT TABS Take 1 tablet by mouth daily.  . celecoxib (CELEBREX) 200 MG capsule Take 1 capsule (200 mg total) by mouth 2 (two) times daily.  . colchicine (COLCRYS) 0.6 MG tablet Take 1 tablet (0.6 mg total) by mouth 2 (two) times daily.  . fexofenadine (ALLEGRA) 60 MG  tablet Take 60 mg by mouth 2 (two) times daily.  . fluticasone (FLONASE) 50 MCG/ACT nasal spray   . Lancet Devices (ACCU-CHEK SOFTCLIX) lancets 1 each by Other route daily. Use as instructed  . Lancets Misc. (ACCU-CHEK SOFTCLIX LANCET DEV) KIT 1 each by Does not apply route daily.  Marland Kitchen losartan-hydrochlorothiazide (HYZAAR) 100-25 MG tablet Take 1 tablet by mouth daily.  . metFORMIN (GLUCOPHAGE-XR) 500 MG 24 hr tablet Take 1 tablet (500 mg total) by mouth daily with breakfast.  . metoprolol succinate (TOPROL-XL) 50 MG 24 hr tablet TAKE 3 TABLETS (150 MG) DAILY WITH OR IMMEDIATELY FOLLOWING A MEAL.  . mupirocin ointment (BACTROBAN) 2 % PLACE 1 APPLICATION INTO THE NOSE 2 TIMES A DAILY.  Marland Kitchen omeprazole (PRILOSEC) 20 MG capsule Take 1 capsule (20 mg total) by mouth 2 (two) times daily before a meal.  . psyllium (METAMUCIL SMOOTH TEXTURE) 28 % packet Take 1 packet by mouth 2 (two) times daily.  . simvastatin (ZOCOR) 40 MG tablet TAKE 1 TABLET AT BEDTIME  . traMADol (ULTRAM) 50 MG tablet TAKE 1 TABLET BY MOUTH EVERY 6 HOURS AS NEEDED  . TRUE METRIX BLOOD GLUCOSE TEST test strip TEST EVERY DAY    PHQ 2/9 Scores 08/27/2018 02/21/2018 02/07/2017 01/28/2016  PHQ - 2 Score 0 0 0 0  PHQ- 9 Score - 0 - -   Wt Readings from Last 3 Encounters:  08/27/18 183 lb (83 kg)  08/09/18 170 lb (77.1 kg)  07/02/18 176 lb (79.8 kg)    Physical Exam Vitals signs and nursing note reviewed.  Constitutional:      General: She is not in acute distress.    Appearance: She is well-developed.  HENT:     Head: Normocephalic and atraumatic.  Eyes:     Pupils: Pupils are equal, round, and reactive to light.  Neck:     Musculoskeletal: Normal range of motion and neck supple.  Cardiovascular:     Rate and Rhythm: Normal rate and regular rhythm.  Pulmonary:     Effort: Pulmonary effort is normal. No respiratory distress.     Breath sounds: Normal breath sounds. No wheezing or rhonchi.  Musculoskeletal:     Right knee: She  exhibits decreased range of motion. She exhibits no swelling and no effusion.  Lymphadenopathy:     Cervical: No cervical adenopathy.  Skin:    General: Skin is warm and dry.     Findings: No rash.  Neurological:     Mental Status: She is alert and oriented to person, place, and time.  Psychiatric:        Behavior: Behavior normal.        Thought Content: Thought content normal.     BP 124/66   Pulse 68   Ht _0  (1.676 m)   Wt 183 lb (83 kg)   LMP 09/27/1995   SpO2 95%   BMI 29.54 kg/m   Assessment and Plan: 1. Influenza A resolved  2. Pulmonary emphysema, unspecified emphysema type ([place]) S/p Levaquin therapy for sinus/bronchitis  3. Primary osteoarthritis of both  knees Continue Celebrex Use Tramadol for more severe pain - traMADol (ULTRAM) 50 MG tablet; Take 1 tablet (50 mg total) by mouth every 6 (six) hours as needed.  Dispense: 60 tablet; Refill: 2  4. Essential (primary) hypertension controlled   Partially dictated using Editor, commissioning. Any errors are unintentional.  Halina Maidens, MD [facility]  08/27/2018

## 2018-09-04 ENCOUNTER — Other Ambulatory Visit: Payer: Self-pay | Admitting: Internal Medicine

## 2018-09-04 DIAGNOSIS — E785 Hyperlipidemia, unspecified: Secondary | ICD-10-CM

## 2018-09-04 DIAGNOSIS — E119 Type 2 diabetes mellitus without complications: Secondary | ICD-10-CM

## 2018-09-04 DIAGNOSIS — E1169 Type 2 diabetes mellitus with other specified complication: Secondary | ICD-10-CM

## 2018-09-04 DIAGNOSIS — I10 Essential (primary) hypertension: Secondary | ICD-10-CM

## 2018-09-04 DIAGNOSIS — K219 Gastro-esophageal reflux disease without esophagitis: Secondary | ICD-10-CM

## 2018-09-13 ENCOUNTER — Other Ambulatory Visit: Payer: Self-pay

## 2018-09-13 DIAGNOSIS — I10 Essential (primary) hypertension: Secondary | ICD-10-CM

## 2018-09-13 MED ORDER — METOPROLOL SUCCINATE ER 50 MG PO TB24: ORAL_TABLET | ORAL | 1 refills | Status: DC

## 2018-10-03 ENCOUNTER — Other Ambulatory Visit: Payer: Self-pay | Admitting: Internal Medicine

## 2018-10-03 DIAGNOSIS — M10071 Idiopathic gout, right ankle and foot: Secondary | ICD-10-CM

## 2018-10-09 ENCOUNTER — Other Ambulatory Visit: Payer: Self-pay | Admitting: Internal Medicine

## 2018-10-09 DIAGNOSIS — K219 Gastro-esophageal reflux disease without esophagitis: Secondary | ICD-10-CM

## 2018-10-09 DIAGNOSIS — E119 Type 2 diabetes mellitus without complications: Secondary | ICD-10-CM

## 2018-10-24 ENCOUNTER — Ambulatory Visit: Payer: Medicare PPO | Admitting: Internal Medicine

## 2018-10-24 ENCOUNTER — Other Ambulatory Visit: Payer: Self-pay

## 2018-10-24 ENCOUNTER — Ambulatory Visit
Admission: RE | Admit: 2018-10-24 | Discharge: 2018-10-24 | Disposition: A | Discharge status: Home / Self Care | Payer: Medicare PPO | Attending: Internal Medicine | Admitting: Internal Medicine

## 2018-10-24 ENCOUNTER — Encounter: Payer: Self-pay | Admitting: Internal Medicine

## 2018-10-24 ENCOUNTER — Ambulatory Visit
Admission: RE | Admit: 2018-10-24 | Discharge: 2018-10-24 | Disposition: A | Discharge status: Home / Self Care | Payer: Medicare PPO | Source: Ambulatory Visit | Attending: Internal Medicine | Admitting: Internal Medicine

## 2018-10-24 VITALS — BP 124/78 | HR 84 | Ht 66.0 in | Wt 189.0 lb

## 2018-10-24 DIAGNOSIS — F17201 Nicotine dependence, unspecified, in remission: Secondary | ICD-10-CM

## 2018-10-24 DIAGNOSIS — H1031 Unspecified acute conjunctivitis, right eye: Secondary | ICD-10-CM

## 2018-10-24 DIAGNOSIS — R21 Rash and other nonspecific skin eruption: Secondary | ICD-10-CM

## 2018-10-24 MED ORDER — NEOMYCIN-POLYMYXIN-DEXAMETH 3.5-10000-0.1 OP SUSP: 2.0000 [drp] | Freq: Four times a day (QID) | OPHTHALMIC | 0 refills | Status: AC

## 2018-10-24 NOTE — Progress Notes (Signed)
Date:  10/24/2018   Name:  Kiara Campbell   DOB:  May 01, 1956   MRN:  [id number]   Chief Complaint: Conjunctivitis (X 3 days. Vision is changing. Itching, gunk in eye in morning, and very pink. Blurry vision ) and Rash (Right arm rash and some now on the left arm. Itching. No pain. X 2 weeks.)  Conjunctivitis   The current episode started 3 to 5 days ago. The onset was sudden. The problem has been unchanged. The problem is moderate. Associated symptoms include decreased vision, eye itching, rash, eye discharge and eye redness. Pertinent negatives include no fever, no ear discharge, no headaches, no rhinorrhea, no sore throat, no cough, no wheezing and no eye pain. The eye pain is mild. The right eye is affected.  Rash  This is a new problem. The current episode started 1 to 4 weeks ago. The problem has been gradually worsening since onset. The affected locations include the left arm, right arm, right fingers, left fingers and chest. Rash characteristics: not itching much but slightly worse after a shower. Associated with: lots of extra handwashing. Associated symptoms include fatigue. Pertinent negatives include no cough, eye pain, fever, rhinorrhea, shortness of breath or sore throat. Past treatments include moisturizer. The treatment provided no relief.    Review of Systems  Constitutional: Positive for fatigue. Negative for fever.  HENT: Negative for ear discharge, rhinorrhea and sore throat.   Eyes: Positive for discharge, redness and itching. Negative for pain.  Respiratory: Negative for cough, choking, shortness of breath and wheezing.   Cardiovascular: Negative for chest pain.  Skin: Positive for rash.  Allergic/Immunologic: Positive for environmental allergies.  Neurological: Negative for dizziness, weakness and headaches.  Psychiatric/Behavioral: Negative for sleep disturbance.    Patient Active Problem List   Diagnosis Date Noted  . Tobacco use disorder, severe, in early  remission 08/09/2017  . Emphysema lung ([place]) 07/28/2017  . Obesity (BMI 30-39.9) 07/17/2017  . GERD (gastroesophageal reflux disease) 07/12/2017  . Primary osteoarthritis of both knees 08/01/2016  . Controlled type 2 diabetes mellitus without complication, without long-term current use of insulin ([place]) 06/02/2015  . Fibrocystic breast disease 12/03/2014  . Colon polyp 12/03/2014  . Diverticulitis of colon 12/03/2014  . Essential (primary) hypertension 12/03/2014  . 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 OP (osteoporosis) 12/03/2014  . Hyperlipidemia associated with type 2 diabetes mellitus ([place]) 12/03/2014  . Calcium blood increased 12/23/2013    Allergies  Allergen Reactions  . Glipizide Nausea Only    Past Surgical History:  Procedure Laterality Date  . ABDOMINAL HYSTERECTOMY     partial  . APPENDECTOMY    . BREAST CYST ASPIRATION     neg not sure which side  . 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 TONSILLECTOMY    . VARICOSE VEIN SURGERY      Social History   Tobacco Use  . Smoking status: Former Smoker    Packs/day: 0.50    Years: 30.00    Pack years: 15.00    Types: Cigarettes    Last attempt to quit: 07/04/2017    Years since quitting: 1.3  . Smokeless tobacco: Never Used  Substance Use Topics  . Alcohol use: Yes    Comment: occasional  . Drug use: No     Medication list has been reviewed and updated.  Current Meds  Medication Sig  . ACCU-CHEK SOFTCLIX LANCETS lancets Use as instructed  . Alcohol Swabs (B-D SINGLE USE SWABS REGULAR) PADS  USE DAILY.  Marland Kitchen allopurinol (ZYLOPRIM) 300 MG tablet TAKE 1 TABLET EVERY DAY  . Blood Glucose Monitoring Suppl (TRUE METRIX AIR GLUCOSE METER) DEVI 1 each by Does not apply route daily.  . Calcium Carb-Cholecalciferol (CALCIUM + D3) 600-200 MG-UNIT TABS Take 1 tablet by mouth daily.  . celecoxib (CELEBREX) 200 MG capsule Take 1 capsule (200 mg total) by mouth 2 (two) times daily.  . colchicine  (COLCRYS) 0.6 MG tablet Take 1 tablet (0.6 mg total) by mouth 2 (two) times daily.  . fexofenadine (ALLEGRA) 60 MG tablet Take 60 mg by mouth 2 (two) times daily.  . fluticasone (FLONASE) 50 MCG/ACT nasal spray   . Lancet Devices (ACCU-CHEK SOFTCLIX) lancets 1 each by Other route daily. Use as instructed  . Lancets Misc. (ACCU-CHEK SOFTCLIX LANCET DEV) KIT 1 each by Does not apply route daily.  Marland Kitchen losartan-hydrochlorothiazide (HYZAAR) 100-25 MG tablet TAKE 1 TABLET EVERY DAY  . metFORMIN (GLUCOPHAGE-XR) 500 MG 24 hr tablet TAKE 1 TABLET EVERY DAY WITH BREAKFAST  . metoprolol succinate (TOPROL-XL) 50 MG 24 hr tablet TAKE 3 TABLETS (150 MG) DAILY WITH OR IMMEDIATELY FOLLOWING A MEAL.  . mupirocin ointment (BACTROBAN) 2 % PLACE 1 APPLICATION INTO THE NOSE 2 TIMES A DAILY.  Marland Kitchen omeprazole (PRILOSEC) 20 MG capsule TAKE 1 TABLET TWICE DAILY BEFORE MEALS  . psyllium (METAMUCIL SMOOTH TEXTURE) 28 % packet Take 1 packet by mouth 2 (two) times daily.  . simvastatin (ZOCOR) 40 MG tablet TAKE 1 TABLET AT BEDTIME  . traMADol (ULTRAM) 50 MG tablet Take 1 tablet (50 mg total) by mouth every 6 (six) hours as needed.  . TRUE METRIX BLOOD GLUCOSE TEST test strip TEST EVERY DAY    PHQ 2/9 Scores 10/24/2018 08/27/2018 02/21/2018 02/07/2017  PHQ - 2 Score 1 0 0 0  PHQ- 9 Score - - 0 -    BP Readings from Last 3 Encounters:  10/24/18 124/78  08/27/18 124/66  08/09/18 136/70    Physical Exam Vitals signs and nursing note reviewed.  Constitutional:      General: She is not in acute distress.    Appearance: She is well-developed.  HENT:     Head: Normocephalic and atraumatic.  Eyes:     General: Lids are normal.     Extraocular Movements:     Right eye: Normal extraocular motion and no nystagmus.     Left eye: Normal extraocular motion and no nystagmus.     Conjunctiva/sclera:     Right eye: Chemosis and exudate present.     Left eye: No chemosis or exudate. Neck:     Musculoskeletal: Normal range of  motion and neck supple.  Cardiovascular:     Rate and Rhythm: Normal rate and regular rhythm.     Pulses: Normal pulses.  Pulmonary:     Effort: Pulmonary effort is normal. No respiratory distress.     Breath sounds: Normal breath sounds. No wheezing or rales.  Musculoskeletal: Normal range of motion.  Skin:    General: Skin is warm and dry.     Findings: Rash present.     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  Psychiatric:        Behavior: Behavior normal.        Thought Content:  Thought content normal.     Wt Readings from Last 3 Encounters:  10/24/18 189 lb (85.7 kg)  08/27/18 183 lb (83 kg)  08/09/18 170 lb (77.1 kg)    BP 124/78   Pulse 84   Ht _0  (1.676 m)   Wt 189 lb (85.7 kg)   LMP 09/27/1995   SpO2 95%   BMI 30.51 kg/m   Assessment and Plan: 1. Acute conjunctivitis of right eye, unspecified acute conjunctivitis type - neomycin-polymyxin b-dexamethasone (MAXITROL) 3.5-10000-0.1 SUSP; Place 2 drops into both eyes every 6 (six) hours for 10 days.  Dispense: 5 mL; Refill: 0  2. Rash Has classic appearance of DM; since sx are minimal, will CXR and possible refer to Dermatology for bx and diagnosis - DG Chest 2 View; Future  3. Tobacco use disorder, severe, in early remission - DG Chest 2 View; Future   Partially dictated using Editor, commissioning. Any errors are unintentional.  Halina Maidens, MD [facility]  10/24/2018

## 2018-10-25 ENCOUNTER — Other Ambulatory Visit: Payer: Self-pay | Admitting: Internal Medicine

## 2018-10-25 DIAGNOSIS — R21 Rash and other nonspecific skin eruption: Secondary | ICD-10-CM

## 2018-10-25 NOTE — Progress Notes (Signed)
Patient informed. Said the eye drops you gave her sayd 10 days but at visit you told her to only use for 5. How long should she use?   Also, I told her you wanted to talk to her more and she said she will be waiting on your call.

## 2018-10-30 DIAGNOSIS — D0461 Carcinoma in situ of skin of right upper limb, including shoulder: Secondary | ICD-10-CM | POA: Diagnosis not present

## 2018-10-30 DIAGNOSIS — L820 Inflamed seborrheic keratosis: Secondary | ICD-10-CM | POA: Diagnosis not present

## 2018-10-30 DIAGNOSIS — L578 Other skin changes due to chronic exposure to nonionizing radiation: Secondary | ICD-10-CM | POA: Diagnosis not present

## 2018-10-30 DIAGNOSIS — L570 Actinic keratosis: Secondary | ICD-10-CM | POA: Diagnosis not present

## 2018-10-30 DIAGNOSIS — D485 Neoplasm of uncertain behavior of skin: Secondary | ICD-10-CM | POA: Diagnosis not present

## 2018-11-02 ENCOUNTER — Ambulatory Visit: Payer: Medicare PPO | Admitting: Internal Medicine

## 2018-11-19 ENCOUNTER — Encounter: Payer: Self-pay | Admitting: Internal Medicine

## 2018-11-19 ENCOUNTER — Ambulatory Visit: Payer: Medicare PPO | Admitting: Internal Medicine

## 2018-11-19 ENCOUNTER — Other Ambulatory Visit: Payer: Self-pay

## 2018-11-19 VITALS — BP 124/70 | HR 78 | Ht 66.0 in | Wt 189.0 lb

## 2018-11-19 DIAGNOSIS — M170 Bilateral primary osteoarthritis of knee: Secondary | ICD-10-CM

## 2018-11-19 DIAGNOSIS — Z85828 Personal history of other malignant neoplasm of skin: Secondary | ICD-10-CM | POA: Insufficient documentation

## 2018-11-19 DIAGNOSIS — I10 Essential (primary) hypertension: Secondary | ICD-10-CM | POA: Diagnosis not present

## 2018-11-19 DIAGNOSIS — E1169 Type 2 diabetes mellitus with other specified complication: Secondary | ICD-10-CM | POA: Diagnosis not present

## 2018-11-19 DIAGNOSIS — Z23 Encounter for immunization: Secondary | ICD-10-CM | POA: Diagnosis not present

## 2018-11-19 DIAGNOSIS — L570 Actinic keratosis: Secondary | ICD-10-CM

## 2018-11-19 DIAGNOSIS — E119 Type 2 diabetes mellitus without complications: Secondary | ICD-10-CM

## 2018-11-19 DIAGNOSIS — E785 Hyperlipidemia, unspecified: Secondary | ICD-10-CM | POA: Diagnosis not present

## 2018-11-19 MED ORDER — TRAMADOL HCL 50 MG PO TABS: 50.0000 mg | ORAL_TABLET | Freq: Four times a day (QID) | ORAL | 5 refills | Status: DC | PRN

## 2018-11-19 NOTE — Progress Notes (Signed)
  Date:  11/19/2018   Name:  Kiara Campbell   DOB:  09/24/1955   MRN:  [id number]   Chief Complaint: Diabetes and Hypertension  Diabetes  She presents for her follow-up diabetic visit. She has type 2 diabetes mellitus. Her disease course has been stable. Pertinent negatives for hypoglycemia include no headaches or tremors. Pertinent negatives for diabetes include no chest pain, no fatigue, no polydipsia and no polyuria. There are no diabetic complications. Current diabetic treatment includes oral agent (monotherapy). She is compliant with treatment most of the time. Her weight is stable. She monitors urine at home 1-2 x per week. Her breakfast blood glucose is taken between 7-8 am. Her breakfast blood glucose range is generally 110-130 mg/dl.  Hypertension  This is a chronic problem. The problem is controlled. Pertinent negatives include no chest pain, headaches, palpitations or shortness of breath. Past treatments include ACE inhibitors, beta blockers and diuretics. The current treatment provides significant improvement. There are no compliance problems.   Hyperlipidemia  This is a chronic problem. The problem is controlled. Pertinent negatives include no chest pain or shortness of breath. Current antihyperlipidemic treatment includes statins. The current treatment provides significant improvement of lipids. There are no compliance problems.   Rash - she was seen last month with a rash that was suggestive of DM.  CXR was normal. She was seen by Dermatology and it was felt that the skin changes were due to sun damage and dry skin. She is using TAC cream with improvement.  Several areas were biopsied and one is precancerous.  She will have wider excision tomorrow.  Lab Results  Component Value Date   HGBA1C 6.2 (H) 07/02/2018   Lab Results  Component Value Date   CHOL 111 02/28/2018   HDL 37 (L) 02/28/2018   LDLCALC 39 02/28/2018   TRIG 176 (H) 02/28/2018   CHOLHDL 3.0 02/28/2018   Lab  Results  Component Value Date   CREATININE 0.82 07/02/2018   BUN 15 07/02/2018   NA 142 07/02/2018   K 4.6 07/02/2018   CL 102 07/02/2018   CO2 25 07/02/2018     Review of Systems  Constitutional: Negative for appetite change, fatigue, fever and unexpected weight change.  HENT: Negative for tinnitus and trouble swallowing.   Eyes: Negative for visual disturbance.  Respiratory: Negative for cough, chest tightness and shortness of breath.   Cardiovascular: Negative for chest pain, palpitations and leg swelling.  Gastrointestinal: Negative for abdominal pain.  Endocrine: Negative for polydipsia and polyuria.  Genitourinary: Negative for dysuria and hematuria.  Musculoskeletal: Negative for arthralgias.  Skin:       Several areas biopsies - one on right arm is precancerous  Neurological: Negative for tremors, numbness and headaches.  Psychiatric/Behavioral: Negative for dysphoric mood.    Patient Active Problem List   Diagnosis Date Noted  . Tobacco use disorder, severe, in early remission 08/09/2017  . Emphysema lung ([facility]) 07/28/2017  . Obesity (BMI 30-39.9) 07/17/2017  . GERD (gastroesophageal reflux disease) 07/12/2017  . Primary osteoarthritis of both knees 08/01/2016  . Controlled type 2 diabetes mellitus without complication, without long-term current use of insulin ([facility]) 06/02/2015  . Fibrocystic breast disease 12/03/2014  . Colon polyp 12/03/2014  . Diverticulitis of colon 12/03/2014  . Essential (primary) hypertension 12/03/2014  . 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 OP (osteoporosis) 12/03/2014  . Hyperlipidemia associated with type 2 diabetes mellitus ([facility]) 12/03/2014  . Calcium blood increased 12/23/2013    Allergies    Allergen Reactions  . Glipizide Nausea Only    Past Surgical History:  Procedure Laterality Date  . ABDOMINAL HYSTERECTOMY     partial  . APPENDECTOMY    . BREAST CYST ASPIRATION     neg not sure which side  .  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 TONSILLECTOMY    . VARICOSE VEIN SURGERY      Social History   Tobacco Use  . Smoking status: Former Smoker    Packs/day: 0.50    Years: 30.00    Pack years: 15.00    Types: Cigarettes    Last attempt to quit: 07/04/2018    Years since quitting: 0.3  . Smokeless tobacco: Never Used  Substance Use Topics  . Alcohol use: Yes    Comment: occasional  . Drug use: No     Medication list has been reviewed and updated.  Current Meds  Medication Sig  . ACCU-CHEK SOFTCLIX LANCETS lancets Use as instructed  . Alcohol Swabs (B-D SINGLE USE SWABS REGULAR) PADS USE DAILY.  Marland Kitchen allopurinol (ZYLOPRIM) 300 MG tablet TAKE 1 TABLET EVERY DAY  . Blood Glucose Monitoring Suppl (TRUE METRIX AIR GLUCOSE METER) DEVI 1 each by Does not apply route daily.  . Calcium Carb-Cholecalciferol (CALCIUM + D3) 600-200 MG-UNIT TABS Take 1 tablet by mouth daily.  . celecoxib (CELEBREX) 200 MG capsule Take 1 capsule (200 mg total) by mouth 2 (two) times daily.  . colchicine (COLCRYS) 0.6 MG tablet Take 1 tablet (0.6 mg total) by mouth 2 (two) times daily.  . fexofenadine (ALLEGRA) 60 MG tablet Take 60 mg by mouth 2 (two) times daily.  . fluticasone (FLONASE) 50 MCG/ACT nasal spray   . Lancet Devices (ACCU-CHEK SOFTCLIX) lancets 1 each by Other route daily. Use as instructed  . Lancets Misc. (ACCU-CHEK SOFTCLIX LANCET DEV) KIT 1 each by Does not apply route daily.  Marland Kitchen losartan-hydrochlorothiazide (HYZAAR) 100-25 MG tablet TAKE 1 TABLET EVERY DAY  . metFORMIN (GLUCOPHAGE-XR) 500 MG 24 hr tablet TAKE 1 TABLET EVERY DAY WITH BREAKFAST  . metoprolol succinate (TOPROL-XL) 50 MG 24 hr tablet TAKE 3 TABLETS (150 MG) DAILY WITH OR IMMEDIATELY FOLLOWING A MEAL.  . mupirocin ointment (BACTROBAN) 2 % PLACE 1 APPLICATION INTO THE NOSE 2 TIMES A DAILY.  Marland Kitchen omeprazole (PRILOSEC) 20 MG capsule TAKE 1 TABLET TWICE DAILY BEFORE MEALS  . psyllium (METAMUCIL SMOOTH TEXTURE) 28 % packet Take 1  packet by mouth 2 (two) times daily.  . simvastatin (ZOCOR) 40 MG tablet TAKE 1 TABLET AT BEDTIME  . traMADol (ULTRAM) 50 MG tablet Take 1 tablet (50 mg total) by mouth every 6 (six) hours as needed.  . TRUE METRIX BLOOD GLUCOSE TEST test strip TEST EVERY DAY    PHQ 2/9 Scores 11/19/2018 10/24/2018 08/27/2018 02/21/2018  PHQ - 2 Score 0 1 0 0  PHQ- 9 Score - - - 0    BP Readings from Last 3 Encounters:  11/19/18 124/70  10/24/18 124/78  08/27/18 124/66    Physical Exam Vitals signs and nursing note reviewed.  Constitutional:      General: She is not in acute distress.    Appearance: She is well-developed.  HENT:     Head: Normocephalic and atraumatic.  Eyes:     Pupils: Pupils are equal, round, and reactive to light.  Neck:     Musculoskeletal: Normal range of motion and neck supple.  Cardiovascular:     Rate and Rhythm: Normal rate and regular rhythm.  Pulmonary:  Effort: Pulmonary effort is normal. No respiratory distress.     Breath sounds: Normal breath sounds. No wheezing or rhonchi.  Musculoskeletal: Normal range of motion.     Right lower leg: No edema.     Left lower leg: No edema.  Skin:    General: Skin is warm and dry.     Findings: No rash.  Neurological:     Mental Status: She is alert and oriented to person, place, and time.  Psychiatric:        Behavior: Behavior normal.        Thought Content: Thought content normal.     Wt Readings from Last 3 Encounters:  11/19/18 189 lb (85.7 kg)  10/24/18 189 lb (85.7 kg)  08/27/18 183 lb (83 kg)    BP 124/70   Pulse 78   Ht 5' 6" (1.676 m)   Wt 189 lb (85.7 kg)   LMP 09/27/1995   SpO2 96%   BMI 30.51 kg/m   Assessment and Plan: 1. Essential (primary) hypertension Controlled on current therapy; lisinopril/hct and metoprolol  2. Controlled type 2 diabetes mellitus without complication, without long-term current use of insulin ([facility]) Doing well on metformin - Hemoglobin A1c - Basic metabolic panel   3. Hyperlipidemia associated with type 2 diabetes mellitus ([facility]) On statin therapy  4. Primary osteoarthritis of both knees Doing with tramadol - traMADol (ULTRAM) 50 MG tablet; Take 1 tablet (50 mg total) by mouth every 6 (six) hours as needed.  Dispense: 60 tablet; Refill: 5  5. Need for vaccination for pneumococcus - Pneumococcal polysaccharide vaccine 23-valent greater than or equal to 2yo subcutaneous/IM  6. Actinic keratoses Being followed by Dermatology   Partially dictated using Dragon software. Any errors are unintentional.   , MD [facility]  11/19/2018     

## 2018-11-20 DIAGNOSIS — C44622 Squamous cell carcinoma of skin of right upper limb, including shoulder: Secondary | ICD-10-CM | POA: Diagnosis not present

## 2018-11-20 DIAGNOSIS — D0461 Carcinoma in situ of skin of right upper limb, including shoulder: Secondary | ICD-10-CM | POA: Diagnosis not present

## 2018-11-20 LAB — BASIC METABOLIC PANEL
BUN/Creatinine Ratio: 22 (ref 12–28)
BUN: 19 mg/dL (ref 8–27)
CO2: 25 mmol/L (ref 20–29)
Calcium: 10 mg/dL (ref 8.7–10.3)
Chloride: 102 mmol/L (ref 96–106)
Creatinine, Ser: 0.86 mg/dL (ref 0.57–1.00)
GFR calc Af Amer: 82 mL/min/{1.73_m2} (ref >59)
GFR calc non Af Amer: 71 mL/min/{1.73_m2} (ref >59)
Glucose: 125 mg/dL — ABNORMAL HIGH (ref 65–99)
Potassium: 4.5 mmol/L (ref 3.5–5.2)
Sodium: 141 mmol/L (ref 134–144)

## 2018-11-20 LAB — HEMOGLOBIN A1C
Est. average glucose Bld gHb Est-mCnc: 143 mg/dL
Hgb A1c MFr Bld: 6.6 % — ABNORMAL HIGH (ref 4.8–5.6)

## 2018-11-21 ENCOUNTER — Telehealth: Payer: Self-pay

## 2018-11-21 ENCOUNTER — Other Ambulatory Visit: Payer: Self-pay | Admitting: Internal Medicine

## 2018-11-21 DIAGNOSIS — J3089 Other allergic rhinitis: Secondary | ICD-10-CM

## 2018-11-21 MED ORDER — AZELASTINE HCL 0.1 % NA SOLN: 1.0000 | Freq: Two times a day (BID) | NASAL | 12 refills | Status: DC

## 2018-11-21 NOTE — Telephone Encounter (Signed)
Patient called saying she was supposed to have something called into her pharmacy for post nasal drip and nothing was calle din from her appt.   Please Advise.

## 2018-11-23 ENCOUNTER — Other Ambulatory Visit: Payer: Self-pay | Admitting: Internal Medicine

## 2018-12-17 DIAGNOSIS — L720 Epidermal cyst: Secondary | ICD-10-CM | POA: Diagnosis not present

## 2018-12-17 DIAGNOSIS — D0461 Carcinoma in situ of skin of right upper limb, including shoulder: Secondary | ICD-10-CM | POA: Diagnosis not present

## 2018-12-17 DIAGNOSIS — C44612 Basal cell carcinoma of skin of right upper limb, including shoulder: Secondary | ICD-10-CM | POA: Diagnosis not present

## 2019-02-17 ENCOUNTER — Other Ambulatory Visit: Payer: Self-pay | Admitting: Internal Medicine

## 2019-02-17 DIAGNOSIS — M10071 Idiopathic gout, right ankle and foot: Secondary | ICD-10-CM

## 2019-02-17 DIAGNOSIS — I10 Essential (primary) hypertension: Secondary | ICD-10-CM

## 2019-02-19 DIAGNOSIS — R898 Other abnormal findings in specimens from other organs, systems and tissues: Secondary | ICD-10-CM | POA: Diagnosis not present

## 2019-02-21 ENCOUNTER — Other Ambulatory Visit: Payer: Self-pay | Admitting: Internal Medicine

## 2019-02-21 DIAGNOSIS — M170 Bilateral primary osteoarthritis of knee: Secondary | ICD-10-CM

## 2019-02-21 DIAGNOSIS — K219 Gastro-esophageal reflux disease without esophagitis: Secondary | ICD-10-CM

## 2019-02-21 DIAGNOSIS — E1169 Type 2 diabetes mellitus with other specified complication: Secondary | ICD-10-CM

## 2019-02-21 DIAGNOSIS — I10 Essential (primary) hypertension: Secondary | ICD-10-CM

## 2019-02-25 ENCOUNTER — Ambulatory Visit: Payer: Medicare PPO

## 2019-03-04 ENCOUNTER — Other Ambulatory Visit: Payer: Self-pay

## 2019-03-04 ENCOUNTER — Encounter: Payer: Self-pay | Admitting: Internal Medicine

## 2019-03-04 ENCOUNTER — Ambulatory Visit (INDEPENDENT_AMBULATORY_CARE_PROVIDER_SITE_OTHER): Payer: Medicare PPO | Admitting: Internal Medicine

## 2019-03-04 VITALS — BP 128/70 | HR 68 | Ht 66.0 in | Wt 183.0 lb

## 2019-03-04 DIAGNOSIS — Z1231 Encounter for screening mammogram for malignant neoplasm of breast: Secondary | ICD-10-CM | POA: Diagnosis not present

## 2019-03-04 DIAGNOSIS — I10 Essential (primary) hypertension: Secondary | ICD-10-CM

## 2019-03-04 DIAGNOSIS — Z23 Encounter for immunization: Secondary | ICD-10-CM | POA: Diagnosis not present

## 2019-03-04 DIAGNOSIS — M810 Age-related osteoporosis without current pathological fracture: Secondary | ICD-10-CM | POA: Diagnosis not present

## 2019-03-04 DIAGNOSIS — E118 Type 2 diabetes mellitus with unspecified complications: Secondary | ICD-10-CM | POA: Diagnosis not present

## 2019-03-04 DIAGNOSIS — E785 Hyperlipidemia, unspecified: Secondary | ICD-10-CM | POA: Diagnosis not present

## 2019-03-04 DIAGNOSIS — Z0000 Encounter for general adult medical examination without abnormal findings: Secondary | ICD-10-CM | POA: Diagnosis not present

## 2019-03-04 DIAGNOSIS — Z1211 Encounter for screening for malignant neoplasm of colon: Secondary | ICD-10-CM | POA: Diagnosis not present

## 2019-03-04 DIAGNOSIS — E1169 Type 2 diabetes mellitus with other specified complication: Secondary | ICD-10-CM

## 2019-03-04 LAB — POCT URINALYSIS DIPSTICK
Bilirubin, UA: NEGATIVE
Blood, UA: NEGATIVE
Glucose, UA: NEGATIVE
Ketones, UA: NEGATIVE
Leukocytes, UA: NEGATIVE
Nitrite, UA: NEGATIVE
Protein, UA: NEGATIVE
Spec Grav, UA: 1.015 (ref 1.010–1.025)
Urobilinogen, UA: 0.2 E.U./dL
pH, UA: 6 (ref 5.0–8.0)

## 2019-03-04 NOTE — Progress Notes (Signed)
Date:  03/04/2019   Name:  Kiara Campbell   DOB:  Nov 21, 1955   MRN:  [id number]   Chief Complaint: Annual Exam (Breast Exam. ) Kiara Campbell is a 63 y.o. female who presents today for her Complete Annual Exam. She feels fairly well. She reports exercising very little. She reports she is sleeping well. She is please to report that she is now 8 months without smoking and doing well.  Colonoscopy  2005 TA - has not had another one Mammogram  12/2016 Immunizations up to date  Hypertension This is a chronic problem. The problem is controlled (at home 130/70). Pertinent negatives include no chest pain, headaches, palpitations, peripheral edema or shortness of breath. There are no associated agents to hypertension. Past treatments include beta blockers, diuretics and angiotensin blockers. The current treatment provides significant improvement. There are no compliance problems.   Diabetes She presents for her follow-up diabetic visit. She has type 2 diabetes mellitus. Her disease course has been stable. There are no hypoglycemic associated symptoms. Pertinent negatives for hypoglycemia include no dizziness, headaches, nervousness/anxiousness or tremors. Pertinent negatives for diabetes include no chest pain, no fatigue, no polydipsia and no polyuria. Current diabetic treatment includes oral agent (monotherapy). She is compliant with treatment most of the time. Her weight is stable. She is following a generally healthy diet. She monitors blood glucose at home 3-4 x per week. Her breakfast blood glucose is taken between 6-7 am. Her breakfast blood glucose range is generally 110-130 mg/dl. An ACE inhibitor/angiotensin II receptor blocker is being taken. Eye exam is not current.  Hyperlipidemia This is a chronic problem. Pertinent negatives include no chest pain or shortness of breath. Current antihyperlipidemic treatment includes statins. The current treatment provides moderate improvement of lipids. Risk  factors for coronary artery disease include diabetes mellitus, hypertension, a sedentary lifestyle and post-menopausal.   Lab Results  Component Value Date   HGBA1C 6.6 (H) 11/19/2018   Lab Results  Component Value Date   CHOL 111 02/28/2018   HDL 37 (L) 02/28/2018   LDLCALC 39 02/28/2018   TRIG 176 (H) 02/28/2018   CHOLHDL 3.0 02/28/2018   Lab Results  Component Value Date   CREATININE 0.86 11/19/2018   BUN 19 11/19/2018   NA 141 11/19/2018   K 4.5 11/19/2018   CL 102 11/19/2018   CO2 25 11/19/2018     Review of Systems  Constitutional: Negative for chills, fatigue, fever and unexpected weight change.  HENT: Negative for congestion, hearing loss, tinnitus, trouble swallowing and voice change.   Eyes: Negative for visual disturbance.  Respiratory: Negative for cough, chest tightness, shortness of breath and wheezing.   Cardiovascular: Negative for chest pain, palpitations and leg swelling.  Gastrointestinal: Negative for abdominal pain, constipation, diarrhea and vomiting.  Endocrine: Negative for polydipsia and polyuria.  Genitourinary: Negative for dysuria, frequency, genital sores, vaginal bleeding and vaginal discharge.  Musculoskeletal: Positive for arthralgias and gait problem. Negative for joint swelling.  Skin: Negative for color change and rash.  Neurological: Negative for dizziness, tremors, light-headedness and headaches.  Hematological: Negative for adenopathy. Does not bruise/bleed easily.  Psychiatric/Behavioral: Negative for dysphoric mood and sleep disturbance. The patient is not nervous/anxious.     Patient Active Problem List   Diagnosis Date Noted  . Actinic keratoses 11/19/2018  . Tobacco use disorder, severe, in early remission 08/09/2017  . Emphysema lung ([place]) 07/28/2017  . Obesity (BMI 30-39.9) 07/17/2017  . GERD (gastroesophageal reflux disease) 07/12/2017  . Primary  osteoarthritis of both knees 08/01/2016  . Type II diabetes mellitus with  complication ([place]) 93/26/7124  . Fibrocystic breast disease 12/03/2014  . Colon polyp 12/03/2014  . Diverticulitis of colon 12/03/2014  . Essential (primary) hypertension 12/03/2014  . 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 OP (osteoporosis) 12/03/2014  . Hyperlipidemia associated with type 2 diabetes mellitus ([place]) 12/03/2014  . Calcium blood increased 12/23/2013    Allergies  Allergen Reactions  . Glipizide Nausea Only    Past Surgical History:  Procedure Laterality Date  . ABDOMINAL HYSTERECTOMY     partial  . APPENDECTOMY    . BREAST CYST ASPIRATION     neg not sure which side  . 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 TONSILLECTOMY    . VARICOSE VEIN SURGERY      Social History   Tobacco Use  . Smoking status: Former Smoker    Packs/day: 0.50    Years: 30.00    Pack years: 15.00    Types: Cigarettes    Quit date: 07/04/2018    Years since quitting: 0.6  . Smokeless tobacco: Never Used  Substance Use Topics  . Alcohol use: Yes    Comment: occasional  . Drug use: No     Medication list has been reviewed and updated.  Current Meds  Medication Sig  . ACCU-CHEK SOFTCLIX LANCETS lancets Use as instructed  . Alcohol Swabs (B-D SINGLE USE SWABS REGULAR) PADS USE DAILY.  Marland Kitchen allopurinol (ZYLOPRIM) 300 MG tablet TAKE 1 TABLET EVERY DAY  . azelastine (ASTELIN) 0.1 % nasal spray Place 1 spray into both nostrils 2 (two) times daily. Use in each nostril as directed  . Blood Glucose Monitoring Suppl (TRUE METRIX AIR GLUCOSE METER) DEVI 1 each by Does not apply route daily.  . Calcium Carb-Cholecalciferol (CALCIUM + D3) 600-200 MG-UNIT TABS Take 1 tablet by mouth daily.  . celecoxib (CELEBREX) 200 MG capsule TAKE 1 CAPSULE TWICE DAILY  . fexofenadine (ALLEGRA) 60 MG tablet Take 60 mg by mouth 2 (two) times daily.  . fluticasone (FLONASE) 50 MCG/ACT nasal spray   . Lancet Devices (ACCU-CHEK SOFTCLIX) lancets 1 each by Other route daily. Use as instructed   . Lancets Misc. (ACCU-CHEK SOFTCLIX LANCET DEV) KIT 1 each by Does not apply route daily.  Marland Kitchen losartan-hydrochlorothiazide (HYZAAR) 100-25 MG tablet TAKE 1 TABLET EVERY DAY  . metFORMIN (GLUCOPHAGE-XR) 500 MG 24 hr tablet TAKE 1 TABLET EVERY DAY WITH BREAKFAST  . metoprolol succinate (TOPROL-XL) 50 MG 24 hr tablet TAKE 3 TABLETS (150 MG) DAILY WITH OR IMMEDIATELY FOLLOWING A MEAL.  . mupirocin ointment (BACTROBAN) 2 % PLACE 1 APPLICATION INTO THE NOSE 2 TIMES A DAILY.  Marland Kitchen omeprazole (PRILOSEC) 20 MG capsule TAKE 1 CAPSULE TWICE DAILY BEFORE MEALS  . psyllium (METAMUCIL SMOOTH TEXTURE) 28 % packet Take 1 packet by mouth 2 (two) times daily.  . simvastatin (ZOCOR) 40 MG tablet TAKE 1 TABLET AT BEDTIME  . traMADol (ULTRAM) 50 MG tablet Take 1 tablet (50 mg total) by mouth every 6 (six) hours as needed.  . TRUE METRIX BLOOD GLUCOSE TEST test strip TEST EVERY DAY    PHQ 2/9 Scores 03/04/2019 11/19/2018 10/24/2018 08/27/2018  PHQ - 2 Score - 0 1 0  PHQ- 9 Score - - - -  Exception Documentation Patient refusal - - -    BP Readings from Last 3 Encounters:  03/04/19 128/70  11/19/18 124/70  10/24/18 124/78    Physical Exam Vitals signs and nursing note  reviewed.  Constitutional:      General: She is not in acute distress.    Appearance: She is well-developed. She is obese.  HENT:     Head: Normocephalic and atraumatic.     Right Ear: Tympanic membrane and ear canal normal.     Left Ear: Tympanic membrane and ear canal normal.     Nose:     Right Sinus: No maxillary sinus tenderness.     Left Sinus: No maxillary sinus tenderness.  Eyes:     General: No scleral icterus.       Right eye: No discharge.        Left eye: No discharge.     Conjunctiva/sclera: Conjunctivae normal.  Neck:     Musculoskeletal: Normal range of motion. No erythema.     Thyroid: No thyromegaly.     Vascular: No carotid bruit.  Cardiovascular:     Rate and Rhythm: Normal rate and regular rhythm.     Pulses:  Normal pulses.     Heart sounds: Normal heart sounds.  Pulmonary:     Effort: Pulmonary effort is normal. No respiratory distress.     Breath sounds: No wheezing.  Chest:     Breasts:        Right: No mass, nipple discharge, skin change or tenderness.        Left: No mass, nipple discharge, skin change or tenderness.  Abdominal:     General: Bowel sounds are normal.     Palpations: Abdomen is soft.     Tenderness: There is no abdominal tenderness.  Musculoskeletal:     Lumbar back: She exhibits decreased range of motion. She exhibits no bony tenderness and no spasm.  Lymphadenopathy:     Cervical: No cervical adenopathy.  Skin:    General: Skin is warm and dry.     Capillary Refill: Capillary refill takes less than 2 seconds.     Findings: No rash.  Neurological:     Mental Status: She is alert and oriented to person, place, and time.     Cranial Nerves: No cranial nerve deficit.     Sensory: No sensory deficit.     Motor: Motor function is intact.     Deep Tendon Reflexes: Reflexes are normal and symmetric.     Reflex Scores:      Bicep reflexes are 2+ on the right side and 2+ on the left side.      Patellar reflexes are 2+ on the right side and 2+ on the left side. Psychiatric:        Attention and Perception: Attention normal.        Mood and Affect: Mood normal.        Speech: Speech normal.        Behavior: Behavior normal.        Thought Content: Thought content normal.        Cognition and Memory: Cognition normal.        Judgment: Judgment normal.     Wt Readings from Last 3 Encounters:  03/04/19 183 lb (83 kg)  11/19/18 189 lb (85.7 kg)  10/24/18 189 lb (85.7 kg)    BP 128/70   Pulse 68   Ht '5\' 6"'$  (1.676 m)   Wt 183 lb (83 kg)   LMP 09/27/1995   SpO2 94%   BMI 29.54 kg/m   Assessment and Plan: 1. Annual physical exam Normal exam except for weight Encourage whatever exercise she can do Currently caring  for elderly mother and 2 grandchildren - POCT  urinalysis dipstick  2. Encounter for screening mammogram for breast cancer To be scheduled - MM 3D SCREEN BREAST BILATERAL; Future  3. Colon cancer screening Will try to get number so pt can contact them herself since they say they can't get in touch with her  4. Essential (primary) hypertension Clinically stable exam with well controlled BP.   Tolerating medications, losartan hct and metoprolol, without side effects at this time. Pt to continue current regimen and low sodium diet; benefits of regular exercise as able discussed. - CBC with Differential/Platelet - Comprehensive metabolic panel - TSH  5. Type II diabetes mellitus with complication (HCC) Clinically stable by exam and report without s/s of hypoglycemia. DM complicated by htn, hyperlipidemia, gerd. Tolerating medications metformin 500 mg daily well without side effects or other concerns. DM foot exam normal. Needs to schedule eye exam - Comprehensive metabolic panel - Hemoglobin A1c  6. Hyperlipidemia associated with type 2 diabetes mellitus ([place]) Tolerating statin medication without side effects at this time LDL is at goal of < 70 on current dose Continue same therapy without change at this time. - Lipid panel  7. Need for immunization against influenza - Flu Vaccine QUAD High Dose(Fluad)  8. Osteoporosis, unspecified osteoporosis type, unspecified pathological fracture presence S/p 5 years of Boniva Continue calcium and vitamin D - DG Bone Density; Future   Partially dictated using Editor, commissioning. Any errors are unintentional.  Halina Maidens, MD [facility]  03/04/2019

## 2019-03-05 LAB — LIPID PANEL
Chol/HDL Ratio: 2.5 ratio (ref 0.0–4.4)
Cholesterol, Total: 99 mg/dL — ABNORMAL LOW (ref 100–199)
HDL: 39 mg/dL — ABNORMAL LOW (ref >39)
LDL Chol Calc (NIH): 36 mg/dL (ref 0–99)
Triglycerides: 136 mg/dL (ref 0–149)
VLDL Cholesterol Cal: 24 mg/dL (ref 5–40)

## 2019-03-05 LAB — COMPREHENSIVE METABOLIC PANEL
ALT: 29 IU/L (ref 0–32)
AST: 24 IU/L (ref 0–40)
Albumin/Globulin Ratio: 1.7 (ref 1.2–2.2)
Albumin: 4.7 g/dL (ref 3.8–4.8)
Alkaline Phosphatase: 84 IU/L (ref 39–117)
BUN/Creatinine Ratio: 26 (ref 12–28)
BUN: 25 mg/dL (ref 8–27)
Bilirubin Total: 0.5 mg/dL (ref 0.0–1.2)
CO2: 20 mmol/L (ref 20–29)
Calcium: 9.9 mg/dL (ref 8.7–10.3)
Chloride: 104 mmol/L (ref 96–106)
Creatinine, Ser: 0.98 mg/dL (ref 0.57–1.00)
GFR calc Af Amer: 70 mL/min/{1.73_m2} (ref >59)
GFR calc non Af Amer: 61 mL/min/{1.73_m2} (ref >59)
Globulin, Total: 2.8 g/dL (ref 1.5–4.5)
Glucose: 116 mg/dL — ABNORMAL HIGH (ref 65–99)
Potassium: 4.9 mmol/L (ref 3.5–5.2)
Sodium: 140 mmol/L (ref 134–144)
Total Protein: 7.5 g/dL (ref 6.0–8.5)

## 2019-03-05 LAB — CBC WITH DIFFERENTIAL/PLATELET
Basophils Absolute: 0.1 10*3/uL (ref 0.0–0.2)
Basos: 1 %
EOS (ABSOLUTE): 0.2 10*3/uL (ref 0.0–0.4)
Eos: 3 %
Hematocrit: 28.8 % — ABNORMAL LOW (ref 34.0–46.6)
Hemoglobin: 9.6 g/dL — ABNORMAL LOW (ref 11.1–15.9)
Immature Grans (Abs): 0 10*3/uL (ref 0.0–0.1)
Immature Granulocytes: 0 %
Lymphocytes Absolute: 2.2 10*3/uL (ref 0.7–3.1)
Lymphs: 29 %
MCH: 31.7 pg (ref 26.6–33.0)
MCHC: 33.3 g/dL (ref 31.5–35.7)
MCV: 95 fL (ref 79–97)
Monocytes Absolute: 1.2 10*3/uL — ABNORMAL HIGH (ref 0.1–0.9)
Monocytes: 17 %
Neutrophils Absolute: 3.7 10*3/uL (ref 1.4–7.0)
Neutrophils: 50 %
Platelets: 320 10*3/uL (ref 150–450)
RBC: 3.03 x10E6/uL — ABNORMAL LOW (ref 3.77–5.28)
RDW: 16.4 % — ABNORMAL HIGH (ref 11.7–15.4)
WBC: 7.4 10*3/uL (ref 3.4–10.8)

## 2019-03-05 LAB — TSH: TSH: 2.51 u[IU]/mL (ref 0.450–4.500)

## 2019-03-05 LAB — HEMOGLOBIN A1C
Est. average glucose Bld gHb Est-mCnc: 137 mg/dL
Hgb A1c MFr Bld: 6.4 % — ABNORMAL HIGH (ref 4.8–5.6)

## 2019-03-06 ENCOUNTER — Other Ambulatory Visit: Payer: Self-pay | Admitting: Internal Medicine

## 2019-03-06 DIAGNOSIS — Z1211 Encounter for screening for malignant neoplasm of colon: Secondary | ICD-10-CM

## 2019-03-12 ENCOUNTER — Other Ambulatory Visit: Payer: Self-pay

## 2019-03-12 MED ORDER — METRONIDAZOLE 500 MG PO TABS: 500.0000 mg | ORAL_TABLET | Freq: Two times a day (BID) | ORAL | 0 refills | Status: DC

## 2019-03-12 MED ORDER — CIPROFLOXACIN HCL 500 MG PO TABS: 500.0000 mg | ORAL_TABLET | Freq: Two times a day (BID) | ORAL | 0 refills | Status: AC

## 2019-03-12 NOTE — Progress Notes (Signed)
Patient called saying she is having a diverticulitis flare up. Her stomach is hurting and she is having abdominal pain.  Refilled her Cipro and flagyl per Dr. Army Melia. Told patient is she has any severe pain or bleeding go to ER.   She verbalized understanding of this.

## 2019-03-15 ENCOUNTER — Other Ambulatory Visit: Payer: Self-pay | Admitting: Internal Medicine

## 2019-03-15 DIAGNOSIS — R928 Other abnormal and inconclusive findings on diagnostic imaging of breast: Secondary | ICD-10-CM

## 2019-04-22 ENCOUNTER — Other Ambulatory Visit: Payer: Self-pay

## 2019-04-22 DIAGNOSIS — Z20822 Contact with and (suspected) exposure to covid-19: Secondary | ICD-10-CM

## 2019-04-22 DIAGNOSIS — Z20828 Contact with and (suspected) exposure to other viral communicable diseases: Secondary | ICD-10-CM | POA: Diagnosis not present

## 2019-04-24 LAB — NOVEL CORONAVIRUS, NAA: SARS-CoV-2, NAA: NOT DETECTED

## 2019-04-30 ENCOUNTER — Other Ambulatory Visit: Payer: Medicare PPO

## 2019-05-11 ENCOUNTER — Other Ambulatory Visit: Payer: Self-pay | Admitting: Internal Medicine

## 2019-05-11 DIAGNOSIS — E119 Type 2 diabetes mellitus without complications: Secondary | ICD-10-CM

## 2019-05-13 NOTE — Telephone Encounter (Signed)
Patient will call back when she knows of her schedule.

## 2019-05-15 ENCOUNTER — Ambulatory Visit
Admission: RE | Admit: 2019-05-15 | Discharge: 2019-05-15 | Disposition: A | Discharge status: Home / Self Care | Payer: Medicare PPO | Source: Ambulatory Visit | Attending: Internal Medicine | Admitting: Internal Medicine

## 2019-05-15 DIAGNOSIS — M810 Age-related osteoporosis without current pathological fracture: Secondary | ICD-10-CM | POA: Insufficient documentation

## 2019-05-15 DIAGNOSIS — N6321 Unspecified lump in the left breast, upper outer quadrant: Secondary | ICD-10-CM | POA: Diagnosis not present

## 2019-05-15 DIAGNOSIS — R928 Other abnormal and inconclusive findings on diagnostic imaging of breast: Secondary | ICD-10-CM | POA: Insufficient documentation

## 2019-05-15 DIAGNOSIS — M8589 Other specified disorders of bone density and structure, multiple sites: Secondary | ICD-10-CM | POA: Diagnosis not present

## 2019-05-16 ENCOUNTER — Other Ambulatory Visit: Payer: Self-pay | Admitting: Internal Medicine

## 2019-05-16 DIAGNOSIS — R928 Other abnormal and inconclusive findings on diagnostic imaging of breast: Secondary | ICD-10-CM

## 2019-05-16 DIAGNOSIS — N6320 Unspecified lump in the left breast, unspecified quadrant: Secondary | ICD-10-CM

## 2019-05-17 ENCOUNTER — Ambulatory Visit
Admission: RE | Admit: 2019-05-17 | Discharge: 2019-05-17 | Disposition: A | Discharge status: Home / Self Care | Payer: Medicare PPO | Source: Ambulatory Visit | Attending: Internal Medicine | Admitting: Internal Medicine

## 2019-05-17 DIAGNOSIS — R928 Other abnormal and inconclusive findings on diagnostic imaging of breast: Secondary | ICD-10-CM | POA: Diagnosis not present

## 2019-05-17 DIAGNOSIS — N6320 Unspecified lump in the left breast, unspecified quadrant: Secondary | ICD-10-CM | POA: Insufficient documentation

## 2019-05-17 DIAGNOSIS — N6321 Unspecified lump in the left breast, upper outer quadrant: Secondary | ICD-10-CM | POA: Diagnosis not present

## 2019-05-20 LAB — SURGICAL PATHOLOGY

## 2019-05-22 ENCOUNTER — Ambulatory Visit (INDEPENDENT_AMBULATORY_CARE_PROVIDER_SITE_OTHER): Payer: Medicare PPO

## 2019-05-22 VITALS — Ht 66.0 in | Wt 180.0 lb

## 2019-05-22 DIAGNOSIS — Z0000 Encounter for general adult medical examination without abnormal findings: Secondary | ICD-10-CM

## 2019-05-22 NOTE — Patient Instructions (Signed)
Kiara Campbell , Thank you for taking time to come for your Medicare Wellness Visit. I appreciate your ongoing commitment to your health goals. Please review the following plan we discussed and let me know if I can assist you in the future.   Screening recommendations/referrals: Colonoscopy: done 2005. Please discuss option of cologuard with Dr. Army Melia at your next appointment.  Mammogram: done 05/15/19 Bone Density: done 05/15/19 Recommended yearly ophthalmology/optometry visit for glaucoma screening and checkup Recommended yearly dental visit for hygiene and checkup  Vaccinations: Influenza vaccine: done 03/04/19 Pneumococcal vaccine: done 11/19/18 Tdap vaccine: done 2013 Shingles vaccine: Shingrix discussed. Please contact your pharmacy for coverage information.   Advanced directives: Advance directive discussed with you today. I have provided a copy for you to complete at home and have notarized. Once this is complete please bring a copy in to our office so we can scan it into your chart.  Conditions/risks identified: Recommend increasing physical activity as tolerated  Next appointment: Please follow up in one year for your Medicare Annual Wellness visit.     Preventive Care 29 Years and Older, Female Preventive care refers to lifestyle choices and visits with your health care provider that can promote health and wellness. What does preventive care include?  A yearly physical exam. This is also called an annual well check.  Dental exams once or twice a year.  Routine eye exams. Ask your health care provider how often you should have your eyes checked.  Personal lifestyle choices, including:  Daily care of your teeth and gums.  Regular physical activity.  Eating a healthy diet.  Avoiding tobacco and drug use.  Limiting alcohol use.  Practicing safe sex.  Taking low-dose aspirin every day.  Taking vitamin and mineral supplements as recommended by your health care  provider. What happens during an annual well check? The services and screenings done by your health care provider during your annual well check will depend on your age, overall health, lifestyle risk factors, and family history of disease. Counseling  Your health care provider may ask you questions about your:  Alcohol use.  Tobacco use.  Drug use.  Emotional well-being.  Home and relationship well-being.  Sexual activity.  Eating habits.  History of falls.  Memory and ability to understand (cognition).  Work and work Statistician.  Reproductive health. Screening  You may have the following tests or measurements:  Height, weight, and BMI.  Blood pressure.  Lipid and cholesterol levels. These may be checked every 5 years, or more frequently if you are over 70 years old.  Skin check.  Lung cancer screening. You may have this screening every year starting at age 55 if you have a 30-pack-year history of smoking and currently smoke or have quit within the past 15 years.  Fecal occult blood test (FOBT) of the stool. You may have this test every year starting at age 67.  Flexible sigmoidoscopy or colonoscopy. You may have a sigmoidoscopy every 5 years or a colonoscopy every 10 years starting at age 52.  Hepatitis C blood test.  Hepatitis B blood test.  Sexually transmitted disease (STD) testing.  Diabetes screening. This is done by checking your blood sugar (glucose) after you have not eaten for a while (fasting). You may have this done every 1-3 years.  Bone density scan. This is done to screen for osteoporosis. You may have this done starting at age 64.  Mammogram. This may be done every 1-2 years. Talk to your health care provider  about how often you should have regular mammograms. Talk with your health care provider about your test results, treatment options, and if necessary, the need for more tests. Vaccines  Your health care provider may recommend certain  vaccines, such as:  Influenza vaccine. This is recommended every year.  Tetanus, diphtheria, and acellular pertussis (Tdap, Td) vaccine. You may need a Td booster every 10 years.  Zoster vaccine. You may need this after age 46.  Pneumococcal 13-valent conjugate (PCV13) vaccine. One dose is recommended after age 37.  Pneumococcal polysaccharide (PPSV23) vaccine. One dose is recommended after age 69. Talk to your health care provider about which screenings and vaccines you need and how often you need them. This information is not intended to replace advice given to you by your health care provider. Make sure you discuss any questions you have with your health care provider. Document Released: 07/17/2015 Document Revised: 03/09/2016 Document Reviewed: 04/21/2015 Elsevier Interactive Patient Education  2017 [employer] Prevention in the Home Falls can cause injuries. They can happen to people of all ages. There are many things you can do to make your home safe and to help prevent falls. What can I do on the outside of my home?  Regularly fix the edges of walkways and driveways and fix any cracks.  Remove anything that might make you trip as you walk through a door, such as a raised step or threshold.  Trim any bushes or trees on the path to your home.  Use bright outdoor lighting.  Clear any walking paths of anything that might make someone trip, such as rocks or tools.  Regularly check to see if handrails are loose or broken. Make sure that both sides of any steps have handrails.  Any raised decks and porches should have guardrails on the edges.  Have any leaves, snow, or ice cleared regularly.  Use sand or salt on walking paths during winter.  Clean up any spills in your garage right away. This includes oil or grease spills. What can I do in the bathroom?  Use night lights.  Install grab bars by the toilet and in the tub and shower. Do not use towel bars as grab  bars.  Use non-skid mats or decals in the tub or shower.  If you need to sit down in the shower, use a plastic, non-slip stool.  Keep the floor dry. Clean up any water that spills on the floor as soon as it happens.  Remove soap buildup in the tub or shower regularly.  Attach bath mats securely with double-sided non-slip rug tape.  Do not have throw rugs and other things on the floor that can make you trip. What can I do in the bedroom?  Use night lights.  Make sure that you have a light by your bed that is easy to reach.  Do not use any sheets or blankets that are too big for your bed. They should not hang down onto the floor.  Have a firm chair that has side arms. You can use this for support while you get dressed.  Do not have throw rugs and other things on the floor that can make you trip. What can I do in the kitchen?  Clean up any spills right away.  Avoid walking on wet floors.  Keep items that you use a lot in easy-to-reach places.  If you need to reach something above you, use a strong step stool that has a grab bar.  Keep electrical cords out of the way.  Do not use floor polish or wax that makes floors slippery. If you must use wax, use non-skid floor wax.  Do not have throw rugs and other things on the floor that can make you trip. What can I do with my stairs?  Do not leave any items on the stairs.  Make sure that there are handrails on both sides of the stairs and use them. Fix handrails that are broken or loose. Make sure that handrails are as long as the stairways.  Check any carpeting to make sure that it is firmly attached to the stairs. Fix any carpet that is loose or worn.  Avoid having throw rugs at the top or bottom of the stairs. If you do have throw rugs, attach them to the floor with carpet tape.  Make sure that you have a light switch at the top of the stairs and the bottom of the stairs. If you do not have them, ask someone to add them for  you. What else can I do to help prevent falls?  Wear shoes that:  Do not have high heels.  Have rubber bottoms.  Are comfortable and fit you well.  Are closed at the toe. Do not wear sandals.  If you use a stepladder:  Make sure that it is fully opened. Do not climb a closed stepladder.  Make sure that both sides of the stepladder are locked into place.  Ask someone to hold it for you, if possible.  Clearly mark and make sure that you can see:  Any grab bars or handrails.  First and last steps.  Where the edge of each step is.  Use tools that help you move around (mobility aids) if they are needed. These include:  Canes.  Walkers.  Scooters.  Crutches.  Turn on the lights when you go into a dark area. Replace any light bulbs as soon as they burn out.  Set up your furniture so you have a clear path. Avoid moving your furniture around.  If any of your floors are uneven, fix them.  If there are any pets around you, be aware of where they are.  Review your medicines with your doctor. Some medicines can make you feel dizzy. This can increase your chance of falling. Ask your doctor what other things that you can do to help prevent falls. This information is not intended to replace advice given to you by your health care provider. Make sure you discuss any questions you have with your health care provider. Document Released: 04/16/2009 Document Revised: 11/26/2015 Document Reviewed: 07/25/2014 Elsevier Interactive Patient Education  2017 [employer].

## 2019-05-22 NOTE — Progress Notes (Signed)
Subjective:   Kiara Campbell is a 62 y.o. female who presents for Medicare Annual (Subsequent) preventive examination.  Virtual Visit via Telephone Note  I connected with Elke Holtry Dowler on 05/22/19 at  1:20 PM EST by telephone and verified that I am speaking with the correct person using two identifiers.  Medicare Annual Wellness visit completed telephonically due to Covid-19 pandemic.   Location: Patient: home Provider: office   I discussed the limitations, risks, security and privacy concerns of performing an evaluation and management service by telephone and the availability of in person appointments. The patient expressed understanding and agreed to proceed.  Some vital signs may be absent or patient reported.   Clemetine Marker, LPN    Review of Systems:   Cardiac Risk Factors include: advanced age (>25mn, >>20women);dyslipidemia;hypertension;diabetes mellitus     Objective:     Vitals: Ht _0  (1.676 m)   Wt 180 lb (81.6 kg)   LMP 09/27/1995   BMI 29.05 kg/m   Body mass index is 29.05 kg/m.  Advanced Directives 05/22/2019 08/09/2018 02/21/2018 02/07/2017 01/28/2016 09/28/2015 09/16/2015  Does Patient Have a Medical Advance Directive? _1  No No  Would patient like information on creating a medical advance directive? Yes (MAU/Ambulatory/Procedural Areas - Information given) - Yes (MAU/Ambulatory/Procedural Areas - Information given) - Yes - Educational materials given No - patient declined information No - patient declined information    Tobacco Social History   Tobacco Use  Smoking Status Former Smoker  . Packs/day: 0.50  . Years: 30.00  . Pack years: 15.00  . Types: Cigarettes  . Quit date: 08/04/2018  . Years since quitting: 0.7  Smokeless Tobacco Never Used     Counseling given: Not Answered   Clinical Intake:  Pre-visit preparation completed: Yes  Pain : No/denies pain     BMI - recorded: 29.05 Nutritional Status: BMI 25 -29 Overweight  Nutritional Risks: None Diabetes: Yes CBG done?: No Did pt. bring in CBG monitor from home?: No   Nutrition Risk Assessment:  Has the patient had any N/V/D within the last 2 months?  Yes  - diarrhea from metformin Does the patient have any non-healing wounds?  No  Has the patient had any unintentional weight loss or weight gain?  No   Diabetes:  Is the patient diabetic?  Yes  If diabetic, was a CBG obtained today?  No  Did the patient bring in their glucometer from home?  No  How often do you monitor your CBG's? 3 times per week on average.   Financial Strains and Diabetes Management:  Are you having any financial strains with the device, your supplies or your medication? No .  Does the patient want to be seen by Chronic Care Management for management of their diabetes?  No  Would the patient like to be referred to a Nutritionist or for Diabetic Management?  No   Diabetic Exams:  Diabetic Eye Exam: Overdue for diabetic eye exam. Pt has been advised about the importance in completing this exam.   Diabetic Foot Exam: Completed 02/28/18. Pt has been advised about the importance in completing this exam.    How often do you need to have someone help you when you read instructions, pamphlets, or other written materials from your doctor or pharmacy?: 1 - Never  Interpreter Needed?: No  Information entered by :: KClemetine MarkerLPN  Past Medical History:  Diagnosis Date  . [place]   .  Hyperlipidemia   . Hypertension   . Osteopenia    Past Surgical History:  Procedure Laterality Date  . ABDOMINAL HYSTERECTOMY     partial  . APPENDECTOMY    . BREAST CYST ASPIRATION     neg not sure which side  . 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 TONSILLECTOMY    . VARICOSE VEIN SURGERY     Family History  Problem Relation Age of Onset  . CAD Father        died age 73  . COPD Mother   . Diabetes Maternal Grandmother   . Breast cancer Neg Hx     Social History   Socioeconomic History  . Marital status: Married    Spouse name: Not on file  . Number of children: 2  . Years of education: Not on file  . Highest education level: Bachelor's degree (e.g., BA, AB, BS)  Occupational History  . Occupation: Retired  Scientific laboratory technician  . Financial resource strain: Not hard at all  . Food insecurity    Worry: Never true    Inability: Never true  . Transportation needs    Medical: No    Non-medical: No  Tobacco Use  . Smoking status: Former Smoker    Packs/day: 0.50    Years: 30.00    Pack years: 15.00    Types: Cigarettes    Quit date: 08/04/2018    Years since quitting: 0.7  . Smokeless tobacco: Never Used  Substance and Sexual Activity  . Alcohol use: Yes    Comment: occasional  . Drug use: No  . Sexual activity: Not Currently  Lifestyle  . Physical activity    Days per week: 0 days    Minutes per session: 0 min  . Stress: Only a little  Relationships  . Social Herbalist on phone: Patient refused    Gets together: Patient refused    Attends religious service: Patient refused    Active member of club or organization: Patient refused    Attends meetings of clubs or organizations: Patient refused    Relationship status: Married  Other Topics Concern  . Not on file  Social History Narrative  . Not on file    Outpatient Encounter Medications as of 05/22/2019  Medication Sig  . ACCU-CHEK SOFTCLIX LANCETS lancets Use as instructed  . Alcohol Swabs (B-D SINGLE USE SWABS REGULAR) PADS USE DAILY.  Marland Kitchen allopurinol (ZYLOPRIM) 300 MG tablet TAKE 1 TABLET EVERY DAY  . azelastine (ASTELIN) 0.1 % nasal spray Place 1 spray into both nostrils 2 (two) times daily. Use in each nostril as directed  . Blood Glucose Monitoring Suppl (TRUE METRIX AIR GLUCOSE METER) DEVI 1 each by Does not apply route daily.  . Calcium Carb-Cholecalciferol (CALCIUM + D3) 600-200 MG-UNIT TABS Take 1 tablet by mouth 2 (two) times daily.   .  celecoxib (CELEBREX) 200 MG capsule TAKE 1 CAPSULE TWICE DAILY  . fexofenadine (ALLEGRA) 60 MG tablet Take 60 mg by mouth 2 (two) times daily.  Elmore Guise Devices (ACCU-CHEK SOFTCLIX) lancets 1 each by Other route daily. Use as instructed  . Lancets Misc. (ACCU-CHEK SOFTCLIX LANCET DEV) KIT 1 each by Does not apply route daily.  Marland Kitchen losartan-hydrochlorothiazide (HYZAAR) 100-25 MG tablet TAKE 1 TABLET EVERY DAY  . metFORMIN (GLUCOPHAGE-XR) 500 MG 24 hr tablet TAKE 1 TABLET EVERY DAY WITH BREAKFAST  . metoprolol succinate (TOPROL-XL) 50 MG 24 hr tablet TAKE 3 TABLETS (150 MG) DAILY WITH OR IMMEDIATELY  FOLLOWING A MEAL.  . mupirocin ointment (BACTROBAN) 2 % PLACE 1 APPLICATION INTO THE NOSE 2 TIMES A DAILY.  Marland Kitchen omeprazole (PRILOSEC) 20 MG capsule TAKE 1 CAPSULE TWICE DAILY BEFORE MEALS  . psyllium (METAMUCIL SMOOTH TEXTURE) 28 % packet Take 1 packet by mouth 2 (two) times daily.  . simvastatin (ZOCOR) 40 MG tablet TAKE 1 TABLET AT BEDTIME  . traMADol (ULTRAM) 50 MG tablet Take 1 tablet (50 mg total) by mouth every 6 (six) hours as needed.  . TRUE METRIX BLOOD GLUCOSE TEST test strip TEST EVERY DAY  . vitamin C (ASCORBIC ACID) 500 MG tablet Take 500 mg by mouth daily.  . fluticasone (FLONASE) 50 MCG/ACT nasal spray   . [DISCONTINUED] metroNIDAZOLE (FLAGYL) 500 MG tablet Take 1 tablet (500 mg total) by mouth 2 (two) times daily.   No facility-administered encounter medications on file as of 05/22/2019.     Activities of Daily Living In your present state of health, do you have any difficulty performing the following activities: 05/22/2019 03/04/2019  Hearing? N N  Comment declines hearing aids -  Vision? N N  Difficulty concentrating or making decisions? N N  Walking or climbing stairs? N N  Dressing or bathing? N N  Doing errands, shopping? N N  Preparing Food and eating ? N -  Using the Toilet? N -  In the past six months, have you accidently leaked urine? Y -  Comment wears pads for  protection -  Do you have problems with loss of bowel control? Y -  Comment metformin causes diarrhea -  Managing your Medications? N -  Managing your Finances? N -  Housekeeping or managing your Housekeeping? N -  Some recent data might be hidden    Patient Care Team: Glean Hess, MD as PCP - General (Internal Medicine) [facility] as Consulting Physician Burnis Kingfisher, MD as Consulting Physician (Orthopedic Surgery) Myna Hidalgo (Optometry) Jannet Mantis, MD (Dermatology)    Assessment:   This is a routine wellness examination for Freddy.  Exercise Activities and Dietary recommendations Current Exercise Habits: The patient does not participate in regular exercise at present, Exercise limited by: orthopedic condition(s)  Goals    . DIET - INCREASE WATER INTAKE     Recommend to drink at least 6-8 8oz glasses of water per day.    . Family Time     Has raised grand kids for over 12 months after loss of daughter in law and wants to keep family time important.        Fall Risk Fall Risk  05/22/2019 11/19/2018 10/24/2018 08/27/2018 02/21/2018  Falls in the past year? 0 0 0 0 No  Number falls in past yr: 0 0 0 0 -  Injury with Fall? 0 0 0 0 -  Risk for fall due to : - - - - Impaired vision;History of fall(s);Impaired balance/gait  Risk for fall due to: Comment - - - - wears eyeglasses; sciatica and RTKR  Follow up Falls prevention discussed Falls evaluation completed Falls evaluation completed Falls evaluation completed -   FALL RISK PREVENTION PERTAINING TO THE HOME:  Any stairs in or around the home? Yes  If so, do they handrails? Yes   Home free of loose throw rugs in walkways, pet beds, electrical cords, etc? Yes  Adequate lighting in your home to reduce risk of falls? Yes   ASSISTIVE DEVICES UTILIZED TO PREVENT FALLS:  Life alert? No  Use of a cane, walker or w/c?  Yes  Grab bars in the bathroom? Yes  Shower chair or bench in shower? Yes  Elevated  toilet seat or a handicapped toilet? Yes   DME ORDERS:  DME order needed?  No   TIMED UP AND GO:  Was the test performed? No . Telephonic visit.   Education: Fall risk prevention has been discussed.  Intervention(s) required? No    Depression Screen PHQ 2/9 Scores 05/22/2019 03/04/2019 11/19/2018 10/24/2018  PHQ - 2 Score 0 - 0 1  PHQ- 9 Score - - - -  Exception Documentation - Patient refusal - -     Cognitive Function     6CIT Screen 05/22/2019 02/21/2018 02/07/2017  What Year? 0 points 0 points 0 points  What month? 0 points 0 points 0 points  What time? 0 points 0 points 0 points  Count back from 20 0 points 0 points 0 points  Months in reverse 0 points 2 points 0 points  Repeat phrase 0 points 2 points -  Total Score 0 4 -    Immunization History  Administered Date(s) Administered  . Fluad Quad(high Dose 65+) 03/04/2019  . Influenza,inj,Quad PF,6+ Mos 03/10/2015, 04/26/2016, 04/25/2017, 04/20/2018  . Influenza-Unspecified 04/12/2017  . Pneumococcal Conjugate-13 01/28/2016  . Pneumococcal Polysaccharide-23 07/05/2008, 11/19/2018  . Tdap 07/06/2011  . Zoster 07/05/2012    Qualifies for Shingles Vaccine? Yes  Zostavax completed 2014. Due for Shingrix. Education has been provided regarding the importance of this vaccine. Pt has been advised to call insurance company to determine out of pocket expense. Advised may also receive vaccine at local pharmacy or Health Dept. Verbalized acceptance and understanding.  Tdap: Up to date  Flu Vaccine: Up to date  Pneumococcal Vaccine: Up to date   Screening Tests Health Maintenance  Topic Date Due  . COLONOSCOPY  07/05/2013  . OPHTHALMOLOGY EXAM  10/22/2016  . FOOT EXAM  03/01/2019  . HEMOGLOBIN A1C  09/01/2019  . MAMMOGRAM  05/14/2020  . TETANUS/TDAP  07/05/2021  . INFLUENZA VACCINE  Completed  . DEXA SCAN  Completed  . PNA vac Low Risk Adult  Completed  . Hepatitis C Screening  Addressed  . HIV Screening  Addressed   . PAP SMEAR-Modifier  Discontinued    Cancer Screenings:  Colorectal Screening: Completed 2005. Pt is hesitant to repeat due to history of anal prolapse. Pt would like to discuss cologuard at next appt.   Mammogram: Completed 05/15/19. Repeat every year;   Bone Density: Completed 05/15/19. Results reflect  OSTEOPENIA. Repeat every 2 years.   Lung Cancer Screening: (Low Dose CT Chest recommended if Age 93-80 years, 30 pack-year currently smoking OR have quit w/in 15years.) does qualify.   Lung Cancer Screening Referral: An Epic message has been sent to Burgess Estelle, RN (Oncology Nurse Navigator) regarding the possible need for this exam. Raquel Sarna will review the patient's chart to determine if the patient truly qualifies for the exam. If the patient qualifies, Raquel Sarna will order the Low Dose CT of the chest to facilitate the scheduling of this exam.  Additional Screening:  Hepatitis C Screening: does qualify; Completed 12/16/13  Vision Screening: Recommended annual ophthalmology exams for early detection of glaucoma and other disorders of the eye. Is the patient up to date with their annual eye exam?  No Who is the provider or what is the name of the office in which the pt attends annual eye exams? Dr. Ruthann Cancer  Dental Screening: Recommended annual dental exams for proper oral hygiene  Community Resource Referral:  CRR required this visit?  No      Plan:     I have personally reviewed and addressed the Medicare Annual Wellness questionnaire and have noted the following in the patient's chart:  A. Medical and social history B. Use of alcohol, tobacco or illicit drugs  C. Current medications and supplements D. Functional ability and status E.  Nutritional status F.  Physical activity G. Advance directives H. List of other physicians I.  Hospitalizations, surgeries, and ER visits in previous 12 months J.  Howe such as hearing and vision if needed, cognitive and  depression L. Referrals and appointments   In addition, I have reviewed and discussed with patient certain preventive protocols, quality metrics, and best practice recommendations. A written personalized care plan for preventive services as well as general preventive health recommendations were provided to patient.   Signed,  Clemetine Marker, LPN Nurse Health Advisor   Nurse Notes:

## 2019-06-19 ENCOUNTER — Telehealth: Payer: Self-pay

## 2019-06-19 NOTE — Telephone Encounter (Signed)
Tried calling pt to remind her to call [facility] about colonoscopy needed.  Awaiting call back from patient to discuss.  Benedict Needy, CMA

## 2019-06-19 NOTE — Telephone Encounter (Signed)
LM to call back.

## 2019-06-21 ENCOUNTER — Telehealth: Payer: Self-pay

## 2019-06-21 NOTE — Telephone Encounter (Signed)
-----   Message from Glean Hess, MD sent at 06/19/2019 10:28 AM EST ----- Regarding: follow up Javia needs to follow up for anemia, DM and to discuss her colonoscopy at [facility].  Please schedule soon.

## 2019-06-21 NOTE — Telephone Encounter (Signed)
Please call patient and schedule a follow up in the next 30 days for anemia, diabetes, and to discuss colonoscopy from [facility].

## 2019-06-21 NOTE — Telephone Encounter (Signed)
LM to call back.

## 2019-07-01 ENCOUNTER — Other Ambulatory Visit: Payer: Self-pay | Admitting: Internal Medicine

## 2019-07-01 DIAGNOSIS — M170 Bilateral primary osteoarthritis of knee: Secondary | ICD-10-CM

## 2019-07-15 ENCOUNTER — Encounter: Payer: Self-pay | Admitting: Internal Medicine

## 2019-07-15 ENCOUNTER — Ambulatory Visit (INDEPENDENT_AMBULATORY_CARE_PROVIDER_SITE_OTHER): Payer: Medicare HMO | Admitting: Internal Medicine

## 2019-07-15 ENCOUNTER — Other Ambulatory Visit: Payer: Self-pay

## 2019-07-15 DIAGNOSIS — E118 Type 2 diabetes mellitus with unspecified complications: Secondary | ICD-10-CM | POA: Diagnosis not present

## 2019-07-15 DIAGNOSIS — E785 Hyperlipidemia, unspecified: Secondary | ICD-10-CM

## 2019-07-15 DIAGNOSIS — M170 Bilateral primary osteoarthritis of knee: Secondary | ICD-10-CM | POA: Diagnosis not present

## 2019-07-15 DIAGNOSIS — K219 Gastro-esophageal reflux disease without esophagitis: Secondary | ICD-10-CM

## 2019-07-15 DIAGNOSIS — E1169 Type 2 diabetes mellitus with other specified complication: Secondary | ICD-10-CM | POA: Diagnosis not present

## 2019-07-15 DIAGNOSIS — I10 Essential (primary) hypertension: Secondary | ICD-10-CM | POA: Diagnosis not present

## 2019-07-15 DIAGNOSIS — J439 Emphysema, unspecified: Secondary | ICD-10-CM

## 2019-07-15 MED ORDER — OMEPRAZOLE 20 MG PO CPDR: 20.0000 mg | DELAYED_RELEASE_CAPSULE | Freq: Two times a day (BID) | ORAL | 1 refills | Status: DC

## 2019-07-15 MED ORDER — TRAMADOL HCL 50 MG PO TABS: 50.0000 mg | ORAL_TABLET | Freq: Four times a day (QID) | ORAL | 0 refills | Status: DC | PRN

## 2019-07-15 MED ORDER — SIMVASTATIN 40 MG PO TABS: 40.0000 mg | ORAL_TABLET | Freq: Every day | ORAL | 1 refills | Status: DC

## 2019-07-15 MED ORDER — METOPROLOL SUCCINATE ER 50 MG PO TB24: ORAL_TABLET | ORAL | 1 refills | Status: DC

## 2019-07-15 MED ORDER — LOSARTAN POTASSIUM-HCTZ 100-25 MG PO TABS: 1.0000 | ORAL_TABLET | Freq: Every day | ORAL | 1 refills | Status: DC

## 2019-07-15 NOTE — Progress Notes (Signed)
Date:  07/15/2019   Name:  Kiara Campbell   DOB:  08-05-1955   MRN:  [id number]   Chief Complaint: Diabetes and Diarrhea (Thinks metformin is causing this. Changed to taking the medicine on a full stomach after breakfast. )  Diabetes She presents for her follow-up diabetic visit. She has type 2 diabetes mellitus. Her disease course has been stable. Pertinent negatives for hypoglycemia include no dizziness, headaches, nervousness/anxiousness or tremors. Pertinent negatives for diabetes include no blurred vision, no chest pain, no fatigue, no foot paresthesias, no polydipsia, no polyphagia, no polyuria, no visual change and no weight loss. Symptoms are stable. Current diabetic treatment includes oral agent (monotherapy). She is compliant with treatment most of the time. Her weight is stable. Her breakfast blood glucose is taken between 6-7 am. Her breakfast blood glucose range is generally 130-140 mg/dl. An ACE inhibitor/angiotensin II receptor blocker is being taken.  Diarrhea  This is a recurrent problem. The stool consistency is described as watery. The patient states that diarrhea does not awaken her from sleep. Pertinent negatives include no abdominal pain, arthralgias, coughing, fever, headaches or weight loss. Exacerbated by: metformin.  Hypertension This is a chronic problem. The problem is controlled. Pertinent negatives include no blurred vision, chest pain, headaches, palpitations or shortness of breath. Past treatments include ACE inhibitors and diuretics. There are no compliance problems.     Lab Results  Component Value Date   CREATININE 0.98 03/04/2019   BUN 25 03/04/2019   NA 140 03/04/2019   K 4.9 03/04/2019   CL 104 03/04/2019   CO2 20 03/04/2019   Lab Results  Component Value Date   CHOL 99 (L) 03/04/2019   HDL 39 (L) 03/04/2019   LDLCALC 36 03/04/2019   TRIG 136 03/04/2019   CHOLHDL 2.5 03/04/2019   Lab Results  Component Value Date   TSH 2.510 03/04/2019    Lab Results  Component Value Date   HGBA1C 6.4 (H) 03/04/2019     Review of Systems  Constitutional: Negative for appetite change, fatigue, fever, unexpected weight change and weight loss.  HENT: Negative for tinnitus and trouble swallowing.   Eyes: Negative for blurred vision and visual disturbance.  Respiratory: Negative for cough, chest tightness and shortness of breath.   Cardiovascular: Negative for chest pain, palpitations and leg swelling.  Gastrointestinal: Positive for diarrhea. Negative for abdominal pain.  Endocrine: Negative for polydipsia, polyphagia and polyuria.  Genitourinary: Negative for dysuria and hematuria.  Musculoskeletal: Negative for arthralgias.  Neurological: Negative for dizziness, tremors, numbness and headaches.  Psychiatric/Behavioral: Negative for dysphoric mood. The patient is not nervous/anxious.     Patient Active Problem List   Diagnosis Date Noted  . Hx of skin cancer, basal cell 11/19/2018  . Tobacco use disorder, severe, in early remission 08/09/2017  . Emphysema lung ([place]) 07/28/2017  . Obesity (BMI 30-39.9) 07/17/2017  . GERD (gastroesophageal reflux disease) 07/12/2017  . Primary osteoarthritis of both knees 08/01/2016  . Type II diabetes mellitus with complication ([place]) 06/22/7587  . Fibrocystic breast disease 12/03/2014  . Colon polyp 12/03/2014  . Diverticulitis of colon 12/03/2014  . Essential (primary) hypertension 12/03/2014  . 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 OP (osteoporosis) 12/03/2014  . Hyperlipidemia associated with type 2 diabetes mellitus ([place]) 12/03/2014  . Calcium blood increased 12/23/2013    Allergies  Allergen Reactions  . Glipizide Nausea Only    Past Surgical History:  Procedure Laterality Date  . ABDOMINAL HYSTERECTOMY  partial  . APPENDECTOMY    . BREAST CYST ASPIRATION     neg not sure which side  . 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 TONSILLECTOMY    . VARICOSE  VEIN SURGERY      Social History   Tobacco Use  . Smoking status: Former Smoker    Packs/day: 0.50    Years: 30.00    Pack years: 15.00    Types: Cigarettes    Quit date: 08/04/2018    Years since quitting: 0.9  . Smokeless tobacco: Never Used  Substance Use Topics  . Alcohol use: Yes    Comment: occasional  . Drug use: No     Medication list has been reviewed and updated.  Current Meds  Medication Sig  . ACCU-CHEK SOFTCLIX LANCETS lancets Use as instructed  . Alcohol Swabs (B-D SINGLE USE SWABS REGULAR) PADS USE DAILY.  Marland Kitchen allopurinol (ZYLOPRIM) 300 MG tablet TAKE 1 TABLET EVERY DAY  . azelastine (ASTELIN) 0.1 % nasal spray Place 1 spray into both nostrils 2 (two) times daily. Use in each nostril as directed  . Blood Glucose Monitoring Suppl (TRUE METRIX AIR GLUCOSE METER) DEVI 1 each by Does not apply route daily.  . Calcium Carb-Cholecalciferol (CALCIUM + D3) 600-200 MG-UNIT TABS Take 1 tablet by mouth 2 (two) times daily.   . celecoxib (CELEBREX) 200 MG capsule TAKE 1 CAPSULE TWICE DAILY  . fexofenadine (ALLEGRA) 60 MG tablet Take 60 mg by mouth 2 (two) times daily.  . fluticasone (FLONASE) 50 MCG/ACT nasal spray   . Lancet Devices (ACCU-CHEK SOFTCLIX) lancets 1 each by Other route daily. Use as instructed  . Lancets Misc. (ACCU-CHEK SOFTCLIX LANCET DEV) KIT 1 each by Does not apply route daily.  Marland Kitchen losartan-hydrochlorothiazide (HYZAAR) 100-25 MG tablet TAKE 1 TABLET EVERY DAY  . metFORMIN (GLUCOPHAGE-XR) 500 MG 24 hr tablet TAKE 1 TABLET EVERY DAY WITH BREAKFAST  . metoprolol succinate (TOPROL-XL) 50 MG 24 hr tablet TAKE 3 TABLETS (150 MG) DAILY WITH OR IMMEDIATELY FOLLOWING A MEAL.  . mupirocin ointment (BACTROBAN) 2 % PLACE 1 APPLICATION INTO THE NOSE 2 TIMES A DAILY.  Marland Kitchen omeprazole (PRILOSEC) 20 MG capsule TAKE 1 CAPSULE TWICE DAILY BEFORE MEALS  . psyllium (METAMUCIL SMOOTH TEXTURE) 28 % packet Take 1 packet by mouth 2 (two) times daily.  . simvastatin (ZOCOR) 40 MG  tablet TAKE 1 TABLET AT BEDTIME  . traMADol (ULTRAM) 50 MG tablet TAKE 1 TABLET BY MOUTH EVERY 6 HOURS AS NEEDED  . TRUE METRIX BLOOD GLUCOSE TEST test strip TEST EVERY DAY  . vitamin C (ASCORBIC ACID) 500 MG tablet Take 500 mg by mouth daily.    PHQ 2/9 Scores 07/15/2019 05/22/2019 03/04/2019 11/19/2018  PHQ - 2 Score 6 0 - 0  PHQ- 9 Score 14 - - -  Exception Documentation - - Patient refusal -    BP Readings from Last 3 Encounters:  07/15/19 114/70  03/04/19 128/70  11/19/18 124/70    Physical Exam Vitals and nursing note reviewed.  Constitutional:      General: She is not in acute distress.    Appearance: Normal appearance. She is well-developed.  HENT:     Head: Normocephalic and atraumatic.  Cardiovascular:     Rate and Rhythm: Normal rate and regular rhythm.     Pulses: Normal pulses.     Heart sounds: No murmur.  Pulmonary:     Effort: Pulmonary effort is normal. No respiratory distress.     Breath sounds: No  wheezing or rhonchi.  Musculoskeletal:        General: Tenderness (posterior left calf - varicose vein) present. No swelling. Normal range of motion.     Cervical back: Normal range of motion.     Right lower leg: No edema.     Left lower leg: No edema.  Lymphadenopathy:     Cervical: No cervical adenopathy.  Skin:    General: Skin is warm and dry.     Capillary Refill: Capillary refill takes less than 2 seconds.     Findings: No rash.  Neurological:     Mental Status: She is alert and oriented to person, place, and time.  Psychiatric:        Attention and Perception: Attention normal.        Mood and Affect: Mood normal.        Behavior: Behavior normal.        Thought Content: Thought content normal.     Wt Readings from Last 3 Encounters:  07/15/19 183 lb (83 kg)  05/22/19 180 lb (81.6 kg)  03/04/19 183 lb (83 kg)    BP 114/70   Pulse 74   Ht '5\' 6"'$  (1.676 m)   Wt 183 lb (83 kg)   LMP 09/27/1995   SpO2 95%   BMI 29.54 kg/m   Assessment  and Plan: 1. Pulmonary emphysema, unspecified emphysema type ([place]) She is doing well, has not smoked in one year. Not on any inhaled medications  2. Type II diabetes mellitus with complication (HCC) Intolerant of metformin due to diarrhea - will stop completely Pt will call back in several weeks to report resolution of symptoms and will start another medication such as Actos at that time - Basic metabolic panel - Hemoglobin A1c  3. Primary osteoarthritis of both knees Stable, chronic symptoms Activity limited and pain managed with tramadol 1-2 per day as needed - traMADol (ULTRAM) 50 MG tablet; Take 1 tablet (50 mg total) by mouth every 6 (six) hours as needed.  Dispense: 60 tablet; Refill: 0  4. Essential (primary) hypertension Clinically stable exam with well controlled BP.   Tolerating medications, losartan hctz and metoprolol, without side effects at this time. Pt to continue current regimen and low sodium diet; benefits of regular exercise as able discussed. - losartan-hydrochlorothiazide (HYZAAR) 100-25 MG tablet; Take 1 tablet by mouth daily.  Dispense: 90 tablet; Refill: 1 - metoprolol succinate (TOPROL-XL) 50 MG 24 hr tablet; Take with or immediately following a meal.  Dispense: 270 tablet; Refill: 1  5. Gastroesophageal reflux disease without esophagitis Symptoms well controlled on daily PPI No red flag signs such as weight loss, n/v, melena Will continue daily omeprazole bid - omeprazole (PRILOSEC) 20 MG capsule; Take 1 capsule (20 mg total) by mouth 2 (two) times daily before a meal.  Dispense: 180 capsule; Refill: 1  6. Hyperlipidemia associated with type 2 diabetes mellitus ([place]) Tolerating statin medication without side effects at this time LDL is at goal of < 70 on current dose Continue same therapy without change at this time. - simvastatin (ZOCOR) 40 MG tablet; Take 1 tablet (40 mg total) by mouth at bedtime.  Dispense: 90 tablet; Refill: 1   Partially dictated  using Editor, commissioning. Any errors are unintentional.  Halina Maidens, MD [facility]  07/15/2019

## 2019-07-16 LAB — BASIC METABOLIC PANEL
BUN/Creatinine Ratio: 29 — ABNORMAL HIGH (ref 12–28)
BUN: 26 mg/dL (ref 8–27)
CO2: 22 mmol/L (ref 20–29)
Calcium: 10 mg/dL (ref 8.7–10.3)
Chloride: 104 mmol/L (ref 96–106)
Creatinine, Ser: 0.9 mg/dL (ref 0.57–1.00)
GFR calc Af Amer: 78 mL/min/{1.73_m2} (ref >59)
GFR calc non Af Amer: 67 mL/min/{1.73_m2} (ref >59)
Glucose: 106 mg/dL — ABNORMAL HIGH (ref 65–99)
Potassium: 4.6 mmol/L (ref 3.5–5.2)
Sodium: 143 mmol/L (ref 134–144)

## 2019-07-16 LAB — HEMOGLOBIN A1C
Est. average glucose Bld gHb Est-mCnc: 140 mg/dL
Hgb A1c MFr Bld: 6.5 % — ABNORMAL HIGH (ref 4.8–5.6)

## 2019-07-17 ENCOUNTER — Other Ambulatory Visit: Payer: Self-pay | Admitting: Internal Medicine

## 2019-07-17 DIAGNOSIS — E785 Hyperlipidemia, unspecified: Secondary | ICD-10-CM

## 2019-07-17 DIAGNOSIS — M10071 Idiopathic gout, right ankle and foot: Secondary | ICD-10-CM

## 2019-07-17 DIAGNOSIS — I10 Essential (primary) hypertension: Secondary | ICD-10-CM

## 2019-07-17 DIAGNOSIS — E1169 Type 2 diabetes mellitus with other specified complication: Secondary | ICD-10-CM

## 2019-07-24 ENCOUNTER — Other Ambulatory Visit: Payer: Self-pay

## 2019-07-24 DIAGNOSIS — I10 Essential (primary) hypertension: Secondary | ICD-10-CM

## 2019-07-24 MED ORDER — METOPROLOL SUCCINATE ER 50 MG PO TB24: ORAL_TABLET | ORAL | 1 refills | Status: DC

## 2019-08-21 ENCOUNTER — Telehealth: Payer: Self-pay

## 2019-08-21 ENCOUNTER — Other Ambulatory Visit: Payer: Self-pay

## 2019-08-21 MED ORDER — COLCHICINE 0.6 MG PO TABS: 0.6000 mg | ORAL_TABLET | Freq: Two times a day (BID) | ORAL | 0 refills | Status: DC

## 2019-08-21 NOTE — Telephone Encounter (Signed)
Patient called saying she was to follow up from her appt 2 weeks ago about diarrhea. Thought Metformin was causing her diarrhea so was told to stop it for 2 weeks and see if it gets better.   She said the diarrhea did not get any better at the time so she started metformin back up once daily with food in the morning and waits to take her other medications for 3 hours after taking metformin.  Kiara Campbell this has helped her and wants to know if she is okay to continue this?  - Please Advise.

## 2019-08-21 NOTE — Telephone Encounter (Signed)
Patient informed. 

## 2019-08-21 NOTE — Telephone Encounter (Signed)
Yes, she can do that

## 2019-11-05 ENCOUNTER — Other Ambulatory Visit: Payer: Self-pay | Admitting: Internal Medicine

## 2019-11-05 DIAGNOSIS — I10 Essential (primary) hypertension: Secondary | ICD-10-CM

## 2019-11-05 DIAGNOSIS — M170 Bilateral primary osteoarthritis of knee: Secondary | ICD-10-CM

## 2019-11-05 DIAGNOSIS — M10071 Idiopathic gout, right ankle and foot: Secondary | ICD-10-CM

## 2019-12-11 ENCOUNTER — Ambulatory Visit: Payer: Medicare HMO | Admitting: Internal Medicine

## 2020-01-05 ENCOUNTER — Other Ambulatory Visit: Payer: Self-pay | Admitting: Internal Medicine

## 2020-01-05 DIAGNOSIS — I10 Essential (primary) hypertension: Secondary | ICD-10-CM

## 2020-01-05 NOTE — Telephone Encounter (Signed)
Requested Prescriptions  Pending Prescriptions Disp Refills   losartan-hydrochlorothiazide (HYZAAR) 100-25 MG tablet [Pharmacy Med Name: LOSARTAN POTASSIUM/HYDROCHLOROTHIAZIDE 100-25 MG Tablet] 90 tablet 0    Sig: TAKE 1 TABLET EVERY DAY     Cardiovascular: ARB + Diuretic Combos Passed - 01/05/2020 12:22 PM      Passed - K in normal range and within 180 days    Potassium  Date Value Ref Range Status  07/15/2019 4.6 3.5 - 5.2 mmol/L Final         Passed - Na in normal range and within 180 days    Sodium  Date Value Ref Range Status  07/15/2019 143 134 - 144 mmol/L Final         Passed - Cr in normal range and within 180 days    Creatinine, Ser  Date Value Ref Range Status  07/15/2019 0.90 0.57 - 1.00 mg/dL Final         Passed - Ca in normal range and within 180 days    Calcium  Date Value Ref Range Status  07/15/2019 10.0 8.7 - 10.3 mg/dL Final         Passed - Patient is not pregnant      Passed - Last BP in normal range    BP Readings from Last 1 Encounters:  07/15/19 114/70         Passed - Valid encounter within last 6 months    Recent Outpatient Visits          5 months ago Pulmonary emphysema, unspecified emphysema type [facility])   [facility] Glean Hess, MD   10 months ago Annual physical exam   [facility] Glean Hess, MD   1 year ago Essential (primary) hypertension   [facility] Glean Hess, MD   1 year ago Acute conjunctivitis of right eye, unspecified acute conjunctivitis type   [facility] Glean Hess, MD   1 year ago Essential (primary) hypertension   [facility] Glean Hess, MD

## 2020-01-16 ENCOUNTER — Other Ambulatory Visit: Payer: Self-pay | Admitting: Internal Medicine

## 2020-01-16 DIAGNOSIS — M170 Bilateral primary osteoarthritis of knee: Secondary | ICD-10-CM

## 2020-01-16 NOTE — Telephone Encounter (Signed)
Please Advise. Last office visit 07/15/2019.  KP

## 2020-01-16 NOTE — Telephone Encounter (Signed)
Requested medication (s) are due for refill today - yes  Requested medication (s) are on the active medication list -yes  Future visit scheduled -no  Last refill: 07/15/19 #60, 10/08/19 #4  Notes to clinic: request RF non delegated Rx  Requested Prescriptions  Pending Prescriptions Disp Refills   traMADol (ULTRAM) 50 MG tablet [Pharmacy Med Name: traMADol HCl 50 MG Oral Tablet] 4 tablet 0    Sig: TAKE 1 TABLET BY MOUTH EVERY 6 HOURS AS NEEDED      Not Delegated - Analgesics:  Opioid Agonists Failed - 01/16/2020 12:32 PM      Failed - This refill cannot be delegated      Failed - Urine Drug Screen completed in last 360 days.      Failed - Valid encounter within last 6 months    Recent Outpatient Visits           6 months ago Pulmonary emphysema, unspecified emphysema type [facility])   [facility] Glean Hess, MD   10 months ago Annual physical exam   [facility] Glean Hess, MD   1 year ago Essential (primary) hypertension   [facility] Glean Hess, MD   1 year ago Acute conjunctivitis of right eye, unspecified acute conjunctivitis type   [facility] Glean Hess, MD   1 year ago Essential (primary) hypertension   [facility] Glean Hess, MD                  Requested Prescriptions  Pending Prescriptions Disp Refills   traMADol (ULTRAM) 50 MG tablet [Pharmacy Med Name: traMADol HCl 50 MG Oral Tablet] 4 tablet 0    Sig: TAKE 1 TABLET BY MOUTH EVERY 6 HOURS AS NEEDED      Not Delegated - Analgesics:  Opioid Agonists Failed - 01/16/2020 12:32 PM      Failed - This refill cannot be delegated      Failed - Urine Drug Screen completed in last 360 days.      Failed - Valid encounter within last 6 months    Recent Outpatient Visits           6 months ago Pulmonary emphysema, unspecified emphysema type [facility])   [facility] Glean Hess, MD   10 months ago Annual physical exam    [facility] Glean Hess, MD   1 year ago Essential (primary) hypertension   [facility] Glean Hess, MD   1 year ago Acute conjunctivitis of right eye, unspecified acute conjunctivitis type   [facility] Glean Hess, MD   1 year ago Essential (primary) hypertension   [facility] Glean Hess, MD

## 2020-01-21 ENCOUNTER — Encounter: Payer: Self-pay | Admitting: Internal Medicine

## 2020-01-21 ENCOUNTER — Other Ambulatory Visit: Payer: Self-pay

## 2020-01-21 ENCOUNTER — Ambulatory Visit (INDEPENDENT_AMBULATORY_CARE_PROVIDER_SITE_OTHER): Payer: Medicare HMO | Admitting: Internal Medicine

## 2020-01-21 VITALS — BP 132/74 | HR 92 | Temp 98.6°F | Ht 66.0 in | Wt 191.0 lb

## 2020-01-21 DIAGNOSIS — M5116 Intervertebral disc disorders with radiculopathy, lumbar region: Secondary | ICD-10-CM

## 2020-01-21 DIAGNOSIS — M170 Bilateral primary osteoarthritis of knee: Secondary | ICD-10-CM

## 2020-01-21 MED ORDER — TRAMADOL HCL 50 MG PO TABS: 50.0000 mg | ORAL_TABLET | Freq: Four times a day (QID) | ORAL | 2 refills | Status: DC | PRN

## 2020-01-21 MED ORDER — PREDNISONE 10 MG PO TABS: 10.0000 mg | ORAL_TABLET | ORAL | 0 refills | Status: AC

## 2020-01-21 MED ORDER — GABAPENTIN 100 MG PO CAPS: 200.0000 mg | ORAL_CAPSULE | Freq: Two times a day (BID) | ORAL | 1 refills | Status: DC

## 2020-01-21 NOTE — Progress Notes (Signed)
Date:  01/21/2020   Name:  Kiara Campbell   DOB:  24-Feb-1956   MRN:  [id number]   Chief Complaint: Hip Pain (Left Hip and leg pain. Ongoing. Several months now- getting harder to walk. Worse at night when laying down. Taking tramadol and gabapentin.)  Hip Pain  There was no injury mechanism. The pain is present in the left leg and left hip. The quality of the pain is described as aching and shooting. The pain is moderate. The pain has been fluctuating since onset. Associated symptoms include an inability to bear weight, numbness and tingling. The symptoms are aggravated by weight bearing and movement. Treatments tried: gabapentin, tramadol.  MRI 2002 at [facility]: FINDINGS:   The current exam was compared with plain  radiograph from 04/02/2001.  There is a small herniation of the  intervertebral discs between the L3 and L4 levels.  There is a  disc bulge at the L4-5 level. There is no evidence of neural  foraminal narrowing or central canal stenosis at any of these  levels. There is no abnormal signal from the cord.  The  surrounding soft tissues are unremarkable.    IMPRESSION:   1)  Herniation of the L3-4 discs in a posterior  right lateral direction.  Small disc bulge at the L4-5 level.    MRI 05/2005 [facility]: IMPRESSION:  1. Right paracentral disc herniation at L3-4 increase in  prominence since prior examination with no significant stenosis.  2. Diffuse disc bulgewith small central disc at L4-5.   Lab Results  Component Value Date   CREATININE 0.90 07/15/2019   BUN 26 07/15/2019   NA 143 07/15/2019   K 4.6 07/15/2019   CL 104 07/15/2019   CO2 22 07/15/2019   Lab Results  Component Value Date   CHOL 99 (L) 03/04/2019   HDL 39 (L) 03/04/2019   LDLCALC 36 03/04/2019   TRIG 136 03/04/2019   CHOLHDL 2.5 03/04/2019   Lab Results  Component Value Date   TSH 2.510 03/04/2019   Lab Results  Component Value Date   HGBA1C 6.5 (H) 07/15/2019   Lab Results    Component Value Date   WBC 7.4 03/04/2019   HGB 9.6 (L) 03/04/2019   HCT 28.8 (L) 03/04/2019   MCV 95 03/04/2019   PLT 320 03/04/2019   Lab Results  Component Value Date   ALT 29 03/04/2019   AST 24 03/04/2019   ALKPHOS 84 03/04/2019   BILITOT 0.5 03/04/2019     Review of Systems  Constitutional: Negative for chills, fatigue and fever.  Respiratory: Negative for chest tightness.   Cardiovascular: Negative for chest pain and leg swelling.  Genitourinary: Negative for difficulty urinating.       Mild urinary leakage  Musculoskeletal: Positive for arthralgias, back pain and gait problem. Negative for joint swelling and myalgias.  Neurological: Positive for tingling, weakness and numbness.  Psychiatric/Behavioral: Negative for dysphoric mood and sleep disturbance. The patient is not nervous/anxious.     Patient Active Problem List   Diagnosis Date Noted  . Hx of skin cancer, basal cell 11/19/2018  . Tobacco use disorder, severe, in early remission 08/09/2017  . Emphysema lung ([place]) 07/28/2017  . Obesity (BMI 30-39.9) 07/17/2017  . GERD (gastroesophageal reflux disease) 07/12/2017  . Primary osteoarthritis of both knees 08/01/2016  . Type II diabetes mellitus with complication ([place]) 00/93/8182  . Fibrocystic breast disease 12/03/2014  . Colon polyp 12/03/2014  . Diverticulitis of colon 12/03/2014  .  Essential (primary) hypertension 12/03/2014  . 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 OP (osteoporosis) 12/03/2014  . Hyperlipidemia associated with type 2 diabetes mellitus ([place]) 12/03/2014  . Calcium blood increased 12/23/2013    Allergies  Allergen Reactions  . Glipizide Nausea Only    Past Surgical History:  Procedure Laterality Date  . ABDOMINAL HYSTERECTOMY     partial  . APPENDECTOMY    . BREAST CYST ASPIRATION     neg not sure which side  . 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 TONSILLECTOMY    . VARICOSE VEIN SURGERY      Social  History   Tobacco Use  . Smoking status: Former Smoker    Packs/day: 0.50    Years: 30.00    Pack years: 15.00    Types: Cigarettes    Quit date: 08/04/2018    Years since quitting: 1.4  . Smokeless tobacco: Never Used  Vaping Use  . Vaping Use: Never used  Substance Use Topics  . Alcohol use: Yes    Comment: occasional  . Drug use: No     Medication list has been reviewed and updated.  Current Meds  Medication Sig  . Alcohol Swabs (B-D SINGLE USE SWABS REGULAR) PADS USE DAILY.  Marland Kitchen allopurinol (ZYLOPRIM) 300 MG tablet TAKE 1 TABLET EVERY DAY  . azelastine (ASTELIN) 0.1 % nasal spray Place 1 spray into both nostrils 2 (two) times daily. Use in each nostril as directed  . Calcium Carb-Cholecalciferol (CALCIUM + D3) 600-200 MG-UNIT TABS Take 1 tablet by mouth 2 (two) times daily.   . celecoxib (CELEBREX) 200 MG capsule TAKE 1 CAPSULE TWICE DAILY  . colchicine (COLCRYS) 0.6 MG tablet Take 1 tablet (0.6 mg total) by mouth 2 (two) times daily.  . fexofenadine (ALLEGRA) 60 MG tablet Take 60 mg by mouth 2 (two) times daily.  . fluticasone (FLONASE) 50 MCG/ACT nasal spray   . gabapentin (NEURONTIN) 100 MG capsule Take 100 mg by mouth 2 (two) times daily.  Elmore Guise Devices (ACCU-CHEK SOFTCLIX) lancets 1 each by Other route daily. Use as instructed  . Lancets Misc. (ACCU-CHEK SOFTCLIX LANCET DEV) KIT 1 each by Does not apply route daily.  Marland Kitchen losartan-hydrochlorothiazide (HYZAAR) 100-25 MG tablet TAKE 1 TABLET EVERY DAY  . metoprolol succinate (TOPROL-XL) 50 MG 24 hr tablet TAKE 3 TABLETS EVERY DAY WITH OR IMMEDIATELY FOLLOWING A MEAL  . mupirocin ointment (BACTROBAN) 2 % PLACE 1 APPLICATION INTO THE NOSE 2 TIMES A DAILY.  Marland Kitchen omeprazole (PRILOSEC) 20 MG capsule Take 1 capsule (20 mg total) by mouth 2 (two) times daily before a meal.  . psyllium (METAMUCIL SMOOTH TEXTURE) 28 % packet Take 1 packet by mouth 2 (two) times daily.  . simvastatin (ZOCOR) 40 MG tablet TAKE 1 TABLET AT BEDTIME  .  traMADol (ULTRAM) 50 MG tablet TAKE 1 TABLET BY MOUTH EVERY 6 HOURS AS NEEDED  . vitamin C (ASCORBIC ACID) 500 MG tablet Take 500 mg by mouth daily.    PHQ 2/9 Scores 07/15/2019 05/22/2019 03/04/2019 11/19/2018  PHQ - 2 Score 6 0 - 0  PHQ- 9 Score 14 - - -  Exception Documentation - - Patient refusal -    No flowsheet data found.  BP Readings from Last 3 Encounters:  01/21/20 132/74  07/15/19 114/70  03/04/19 128/70    Physical Exam Vitals and nursing note reviewed.  Constitutional:      General: She is not in acute distress.    Appearance: She is  well-developed.  HENT:     Head: Normocephalic and atraumatic.  Cardiovascular:     Rate and Rhythm: Normal rate and regular rhythm.  Pulmonary:     Effort: Pulmonary effort is normal. No respiratory distress.  Musculoskeletal:     Thoracic back: No tenderness or bony tenderness.     Lumbar back: No bony tenderness. Decreased range of motion. Positive left straight leg raise test. Negative right straight leg raise test.     Right hip: No bony tenderness or crepitus. Normal range of motion.     Left hip: No bony tenderness or crepitus. Normal range of motion.     Right lower leg: No edema.     Left lower leg: No edema.  Skin:    General: Skin is warm and dry.     Findings: No rash.  Neurological:     Mental Status: She is alert and oriented to person, place, and time.     Sensory: Sensation is intact.     Gait: Gait abnormal.     Deep Tendon Reflexes:     Reflex Scores:      Patellar reflexes are 1+ on the right side and 1+ on the left side. Psychiatric:        Behavior: Behavior normal.        Thought Content: Thought content normal.     Wt Readings from Last 3 Encounters:  01/21/20 191 lb (86.6 kg)  07/15/19 183 lb (83 kg)  05/22/19 180 lb (81.6 kg)    BP 132/74   Pulse 92   Temp 98.6 F (37 C) (Oral)   Ht '5\' 6"'$  (1.676 m)   Wt 191 lb (86.6 kg)   LMP 09/27/1995   SpO2 97%   BMI 30.83 kg/m   Assessment and  Plan: 1. Lumbar disc herniation with radiculopathy Begin steroid taper Increase gabapentin to 100 mg bid and 200 mg at HS Tramadol bid prn - predniSONE (DELTASONE) 10 MG tablet; Take 1 tablet (10 mg total) by mouth as directed for 6 days. Take 6,5,4,3,2,1 then stop  Dispense: 21 tablet; Refill: 0 - gabapentin (NEURONTIN) 100 MG capsule; Take 2 capsules (200 mg total) by mouth 2 (two) times daily.  Dispense: 360 capsule; Refill: 1 - Ambulatory referral to Orthopedic Surgery - traMADol (ULTRAM) 50 MG tablet; Take 1 tablet (50 mg total) by mouth every 6 (six) hours as needed.  Dispense: 60 tablet; Refill: 2  2. Primary osteoarthritis of both knees - traMADol (ULTRAM) 50 MG tablet; Take 1 tablet (50 mg total) by mouth every 6 (six) hours as needed.  Dispense: 60 tablet; Refill: 2   Partially dictated using Editor, commissioning. Any errors are unintentional.  Halina Maidens, MD [facility]  01/21/2020

## 2020-01-28 DIAGNOSIS — G8929 Other chronic pain: Secondary | ICD-10-CM | POA: Diagnosis not present

## 2020-01-28 DIAGNOSIS — M5137 Other intervertebral disc degeneration, lumbosacral region: Secondary | ICD-10-CM | POA: Diagnosis not present

## 2020-01-28 DIAGNOSIS — M1612 Unilateral primary osteoarthritis, left hip: Secondary | ICD-10-CM | POA: Diagnosis not present

## 2020-01-28 DIAGNOSIS — M7062 Trochanteric bursitis, left hip: Secondary | ICD-10-CM | POA: Diagnosis not present

## 2020-01-28 DIAGNOSIS — M47816 Spondylosis without myelopathy or radiculopathy, lumbar region: Secondary | ICD-10-CM | POA: Diagnosis not present

## 2020-01-28 DIAGNOSIS — E119 Type 2 diabetes mellitus without complications: Secondary | ICD-10-CM | POA: Diagnosis not present

## 2020-01-28 DIAGNOSIS — M545 Low back pain: Secondary | ICD-10-CM | POA: Diagnosis not present

## 2020-01-28 DIAGNOSIS — I10 Essential (primary) hypertension: Secondary | ICD-10-CM | POA: Diagnosis not present

## 2020-01-28 DIAGNOSIS — Z7984 Long term (current) use of oral hypoglycemic drugs: Secondary | ICD-10-CM | POA: Diagnosis not present

## 2020-01-28 DIAGNOSIS — Z87891 Personal history of nicotine dependence: Secondary | ICD-10-CM | POA: Diagnosis not present

## 2020-01-28 DIAGNOSIS — Z791 Long term (current) use of non-steroidal anti-inflammatories (NSAID): Secondary | ICD-10-CM | POA: Diagnosis not present

## 2020-01-28 DIAGNOSIS — Z79899 Other long term (current) drug therapy: Secondary | ICD-10-CM | POA: Diagnosis not present

## 2020-01-30 DIAGNOSIS — M7062 Trochanteric bursitis, left hip: Secondary | ICD-10-CM | POA: Diagnosis not present

## 2020-01-30 DIAGNOSIS — G8929 Other chronic pain: Secondary | ICD-10-CM | POA: Diagnosis not present

## 2020-01-30 DIAGNOSIS — M545 Low back pain: Secondary | ICD-10-CM | POA: Diagnosis not present

## 2020-02-05 ENCOUNTER — Telehealth: Payer: Self-pay | Admitting: Internal Medicine

## 2020-02-07 DIAGNOSIS — M545 Low back pain: Secondary | ICD-10-CM | POA: Diagnosis not present

## 2020-02-07 DIAGNOSIS — M7062 Trochanteric bursitis, left hip: Secondary | ICD-10-CM | POA: Diagnosis not present

## 2020-02-07 DIAGNOSIS — G8929 Other chronic pain: Secondary | ICD-10-CM | POA: Diagnosis not present

## 2020-02-10 ENCOUNTER — Other Ambulatory Visit: Payer: Self-pay | Admitting: Internal Medicine

## 2020-02-10 ENCOUNTER — Encounter: Payer: Self-pay | Admitting: Internal Medicine

## 2020-02-10 DIAGNOSIS — E119 Type 2 diabetes mellitus without complications: Secondary | ICD-10-CM

## 2020-02-10 MED ORDER — METFORMIN HCL ER 500 MG PO TB24: 500.0000 mg | ORAL_TABLET | Freq: Every day | ORAL | 3 refills | Status: DC

## 2020-02-10 NOTE — Telephone Encounter (Signed)
Pt called stating that she is still taking this medication. Please advise.

## 2020-02-10 NOTE — Telephone Encounter (Signed)
RF was sent in today 02/10/20.   KP

## 2020-02-13 ENCOUNTER — Telehealth: Payer: Self-pay | Admitting: *Deleted

## 2020-02-13 NOTE — Chronic Care Management (AMB) (Signed)
  Chronic Care Management   Outreach Note  02/13/2020 Name: TALENE GLASTETTER MRN: [id number] DOB: [birth year]  Pamelia Hoit Czarnecki is a 64 y.o. year old female who is a primary care patient of Glean Hess, MD. I reached out to [place] by phone today in response to a referral sent by Ms. Pamelia Hoit [place]'s health plan.     An unsuccessful telephone outreach was attempted today. The patient was referred to the case management team for assistance with care management and care coordination.   Follow Up Plan: A HIPPA compliant phone message was left for the patient providing contact information and requesting a return call. The care management team will reach out to the patient again over the next 7 days. If patient returns call to provider office, please advise to call [place] at [phone].  [place],  [address] Direct Dial: [phone] [email or web address]: [email or web address]

## 2020-02-20 DIAGNOSIS — M7062 Trochanteric bursitis, left hip: Secondary | ICD-10-CM | POA: Diagnosis not present

## 2020-02-20 DIAGNOSIS — G8929 Other chronic pain: Secondary | ICD-10-CM | POA: Diagnosis not present

## 2020-02-20 DIAGNOSIS — M545 Low back pain: Secondary | ICD-10-CM | POA: Diagnosis not present

## 2020-02-24 NOTE — Chronic Care Management (AMB) (Signed)
  Chronic Care Management   Outreach Note  02/24/2020 Name: Kiara Campbell MRN: [id number] DOB: 12/25/1955  Kiara Campbell is a 64 y.o. year old female who is a primary care patient of Glean Hess, MD. I reached out to [place] by phone today in response to a referral sent by Ms. Kiara Hoit [place]'s health plan.     A second unsuccessful telephone outreach was attempted today. The patient was referred to the case management team for assistance with care management and care coordination.   Follow Up Plan: A HIPPA compliant phone message was left for the patient providing contact information and requesting a return call. The care management team will reach out to the patient again over the next 7 days. If patient returns call to provider office, please advise to call [place] at [phone].  [place], [place] [address] Direct Dial: [phone] [email or web address]: [email or web address] ]

## 2020-03-02 NOTE — Chronic Care Management (AMB) (Signed)
  Chronic Care Management   Outreach Note  03/02/2020 Name: Kiara Campbell MRN: [id number] DOB: 03-Sep-1955  [name] is a 64 y.o. year old female who is a primary care patient of Glean Hess, MD. I reached out to [place] by phone today in response to a referral sent by Ms. Pamelia Hoit [place]'s health plan.     Third unsuccessful telephone outreach was attempted today. The patient was referred to the case management team for assistance with care management and care coordination. The patient's primary care provider has been notified of our unsuccessful attempts to make or maintain contact with the patient. The care management team is pleased to engage with this patient at any time in the future should he/she be interested in assistance from the care management team.   Follow Up Plan: A HIPPA compliant phone message was left for the patient providing contact information and requesting a return call. We have been unable to make contact with the patient for follow up. The care management team is available to follow up with the patient after provider conversation with the patient regarding recommendation for care management engagement and subsequent re-referral to the care management team.    [place], [place], [place] [address] Direct Dial: [email or web address] Website: [email or web address]

## 2020-03-04 DIAGNOSIS — M545 Low back pain: Secondary | ICD-10-CM | POA: Diagnosis not present

## 2020-03-04 DIAGNOSIS — M7062 Trochanteric bursitis, left hip: Secondary | ICD-10-CM | POA: Diagnosis not present

## 2020-03-04 DIAGNOSIS — G8929 Other chronic pain: Secondary | ICD-10-CM | POA: Diagnosis not present

## 2020-03-18 ENCOUNTER — Other Ambulatory Visit: Payer: Self-pay | Admitting: Internal Medicine

## 2020-03-18 DIAGNOSIS — E785 Hyperlipidemia, unspecified: Secondary | ICD-10-CM

## 2020-03-18 DIAGNOSIS — I10 Essential (primary) hypertension: Secondary | ICD-10-CM

## 2020-03-18 DIAGNOSIS — M170 Bilateral primary osteoarthritis of knee: Secondary | ICD-10-CM

## 2020-03-18 DIAGNOSIS — K219 Gastro-esophageal reflux disease without esophagitis: Secondary | ICD-10-CM

## 2020-03-24 DIAGNOSIS — G8929 Other chronic pain: Secondary | ICD-10-CM | POA: Diagnosis not present

## 2020-03-24 DIAGNOSIS — M545 Low back pain: Secondary | ICD-10-CM | POA: Diagnosis not present

## 2020-03-27 ENCOUNTER — Other Ambulatory Visit: Payer: Self-pay | Admitting: Internal Medicine

## 2020-03-27 DIAGNOSIS — M10071 Idiopathic gout, right ankle and foot: Secondary | ICD-10-CM

## 2020-03-27 DIAGNOSIS — I10 Essential (primary) hypertension: Secondary | ICD-10-CM

## 2020-03-27 DIAGNOSIS — E785 Hyperlipidemia, unspecified: Secondary | ICD-10-CM

## 2020-03-27 DIAGNOSIS — M170 Bilateral primary osteoarthritis of knee: Secondary | ICD-10-CM

## 2020-04-07 ENCOUNTER — Other Ambulatory Visit: Payer: Self-pay

## 2020-04-07 ENCOUNTER — Encounter: Payer: Self-pay | Admitting: Internal Medicine

## 2020-04-07 ENCOUNTER — Ambulatory Visit (INDEPENDENT_AMBULATORY_CARE_PROVIDER_SITE_OTHER): Payer: Medicare HMO | Admitting: Internal Medicine

## 2020-04-07 VITALS — BP 126/72 | HR 76 | Ht 66.0 in | Wt 191.0 lb

## 2020-04-07 DIAGNOSIS — E1169 Type 2 diabetes mellitus with other specified complication: Secondary | ICD-10-CM | POA: Diagnosis not present

## 2020-04-07 DIAGNOSIS — E785 Hyperlipidemia, unspecified: Secondary | ICD-10-CM

## 2020-04-07 DIAGNOSIS — M170 Bilateral primary osteoarthritis of knee: Secondary | ICD-10-CM

## 2020-04-07 DIAGNOSIS — I10 Essential (primary) hypertension: Secondary | ICD-10-CM

## 2020-04-07 DIAGNOSIS — M5116 Intervertebral disc disorders with radiculopathy, lumbar region: Secondary | ICD-10-CM | POA: Diagnosis not present

## 2020-04-07 DIAGNOSIS — Z23 Encounter for immunization: Secondary | ICD-10-CM | POA: Diagnosis not present

## 2020-04-07 DIAGNOSIS — E118 Type 2 diabetes mellitus with unspecified complications: Secondary | ICD-10-CM | POA: Diagnosis not present

## 2020-04-07 MED ORDER — DULOXETINE HCL 60 MG PO CPEP: 60.0000 mg | ORAL_CAPSULE | Freq: Every day | ORAL | 1 refills | Status: DC

## 2020-04-07 NOTE — Progress Notes (Signed)
Date:  04/07/2020   Name:  Kiara Campbell   DOB:  1956-03-17   MRN:  [id number]   Chief Complaint: Diabetes (Foot exam. A1C. Follow up.), Hypertension (Follow up.), Hip Pain (Left Hip Pain. Seen the Spine center at [facility] and she is unhappy with her care there. She did PT and they are going to dry needle procedure the 18th. ), Flu Vaccine (High dose.), and Rash (Rash breaking out on hands from hand sanitizer - burning. )  Immunization History  Administered Date(s) Administered  . Fluad Quad(high Dose 65+) 03/04/2019  . Influenza,inj,Quad PF,6+ Mos 03/10/2015, 04/26/2016, 04/25/2017, 04/20/2018  . Influenza-Unspecified 04/12/2017  . Moderna SARS-COVID-2 Vaccination 08/14/2019, 09/11/2019  . Pneumococcal Conjugate-13 01/28/2016  . Pneumococcal Polysaccharide-23 07/05/2008, 11/19/2018  . Tdap 07/06/2011  . Zoster 07/05/2012    Hypertension This is a chronic problem. The problem is controlled. Pertinent negatives include no chest pain, headaches, palpitations or shortness of breath. Past treatments include beta blockers, angiotensin blockers and diuretics. The current treatment provides significant improvement.  Diabetes She presents for her follow-up diabetic visit. She has type 2 diabetes mellitus. Her disease course has been stable. Pertinent negatives for hypoglycemia include no headaches or tremors. Pertinent negatives for diabetes include no chest pain, no fatigue, no polydipsia and no polyuria. Current diabetic treatment includes oral agent (monotherapy) (metformin). She monitors blood glucose at home 1-2 x per week. Her breakfast blood glucose is taken between 7-8 am. Her breakfast blood glucose range is generally 110-130 mg/dl. An ACE inhibitor/angiotensin II receptor blocker is being taken. Eye exam is not current.  Back Pain This is a chronic problem. The pain is present in the lumbar spine. The pain is moderate. Pertinent negatives include no abdominal pain, chest pain, dysuria,  fever, headaches or numbness. She has tried analgesics (tramadol) for the symptoms.  Hyperlipidemia This is a chronic problem. The problem is controlled. Pertinent negatives include no chest pain or shortness of breath. Current antihyperlipidemic treatment includes statins. The current treatment provides significant improvement of lipids.    Lab Results  Component Value Date   CREATININE 0.90 07/15/2019   BUN 26 07/15/2019   NA 143 07/15/2019   K 4.6 07/15/2019   CL 104 07/15/2019   CO2 22 07/15/2019   Lab Results  Component Value Date   CHOL 99 (L) 03/04/2019   HDL 39 (L) 03/04/2019   LDLCALC 36 03/04/2019   TRIG 136 03/04/2019   CHOLHDL 2.5 03/04/2019   Lab Results  Component Value Date   TSH 2.510 03/04/2019   Lab Results  Component Value Date   HGBA1C 6.5 (H) 07/15/2019   Lab Results  Component Value Date   WBC 7.4 03/04/2019   HGB 9.6 (L) 03/04/2019   HCT 28.8 (L) 03/04/2019   MCV 95 03/04/2019   PLT 320 03/04/2019   Lab Results  Component Value Date   ALT 29 03/04/2019   AST 24 03/04/2019   ALKPHOS 84 03/04/2019   BILITOT 0.5 03/04/2019     Review of Systems  Constitutional: Negative for appetite change, fatigue, fever and unexpected weight change.  HENT: Negative for tinnitus and trouble swallowing.   Eyes: Negative for visual disturbance.  Respiratory: Negative for cough, chest tightness and shortness of breath.   Cardiovascular: Negative for chest pain, palpitations and leg swelling.  Gastrointestinal: Negative for abdominal pain.  Endocrine: Negative for polydipsia and polyuria.  Genitourinary: Negative for dysuria and hematuria.  Musculoskeletal: Positive for arthralgias (knee pain) and back pain (seen  at [facility] - therapy and dry needling).  Neurological: Negative for tremors, numbness and headaches.  Psychiatric/Behavioral: Negative for dysphoric mood.    Patient Active Problem List   Diagnosis Date Noted  . Hx of skin cancer, basal cell  11/19/2018  . Tobacco use disorder, severe, in early remission 08/09/2017  . Emphysema lung ([place]) 07/28/2017  . Obesity (BMI 30-39.9) 07/17/2017  . GERD (gastroesophageal reflux disease) 07/12/2017  . Primary osteoarthritis of both knees 08/01/2016  . Type II diabetes mellitus with complication ([place]) 93/73/4287  . Fibrocystic breast disease 12/03/2014  . Colon polyp 12/03/2014  . Diverticulitis of colon 12/03/2014  . Essential (primary) hypertension 12/03/2014  . 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 OP (osteoporosis) 12/03/2014  . Hyperlipidemia associated with type 2 diabetes mellitus ([place]) 12/03/2014  . Calcium blood increased 12/23/2013    Allergies  Allergen Reactions  . Glipizide Nausea Only    Past Surgical History:  Procedure Laterality Date  . ABDOMINAL HYSTERECTOMY     partial  . APPENDECTOMY    . BREAST CYST ASPIRATION     neg not sure which side  . 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 TONSILLECTOMY    . VARICOSE VEIN SURGERY      Social History   Tobacco Use  . Smoking status: Former Smoker    Packs/day: 0.50    Years: 30.00    Pack years: 15.00    Types: Cigarettes    Quit date: 08/04/2018    Years since quitting: 1.6  . Smokeless tobacco: Never Used  Vaping Use  . Vaping Use: Never used  Substance Use Topics  . Alcohol use: Yes    Comment: occasional  . Drug use: No     Medication list has been reviewed and updated.  Current Meds  Medication Sig  . Alcohol Swabs (B-D SINGLE USE SWABS REGULAR) PADS USE DAILY.  Marland Kitchen allopurinol (ZYLOPRIM) 300 MG tablet TAKE 1 TABLET EVERY DAY  . azelastine (ASTELIN) 0.1 % nasal spray Place 1 spray into both nostrils 2 (two) times daily. Use in each nostril as directed  . Calcium Carb-Cholecalciferol (CALCIUM + D3) 600-200 MG-UNIT TABS Take 1 tablet by mouth 2 (two) times daily.   . celecoxib (CELEBREX) 200 MG capsule TAKE 1 CAPSULE TWICE DAILY  . colchicine (COLCRYS) 0.6 MG tablet Take 1  tablet (0.6 mg total) by mouth 2 (two) times daily.  . fexofenadine (ALLEGRA) 60 MG tablet Take 60 mg by mouth 2 (two) times daily.  . fluticasone (FLONASE) 50 MCG/ACT nasal spray   . gabapentin (NEURONTIN) 100 MG capsule Take 2 capsules (200 mg total) by mouth 2 (two) times daily. (Patient taking differently: Take 300 mg by mouth 2 (two) times daily. )  . Lancet Devices (ACCU-CHEK SOFTCLIX) lancets 1 each by Other route daily. Use as instructed  . Lancets Misc. (ACCU-CHEK SOFTCLIX LANCET DEV) KIT 1 each by Does not apply route daily.  Marland Kitchen losartan-hydrochlorothiazide (HYZAAR) 100-25 MG tablet TAKE 1 TABLET EVERY DAY  . metFORMIN (GLUCOPHAGE-XR) 500 MG 24 hr tablet Take 1 tablet (500 mg total) by mouth daily with breakfast.  . metoprolol succinate (TOPROL-XL) 50 MG 24 hr tablet TAKE 3 TABLETS EVERY DAY WITH OR IMMEDIATELY FOLLOWING A MEAL  . mupirocin ointment (BACTROBAN) 2 % PLACE 1 APPLICATION INTO THE NOSE 2 TIMES A DAILY.  Marland Kitchen omeprazole (PRILOSEC) 20 MG capsule TAKE 1 CAPSULE TWICE DAILY BEFORE A MEAL  . psyllium (METAMUCIL SMOOTH TEXTURE) 28 % packet Take 1 packet by  mouth 2 (two) times daily.  . simvastatin (ZOCOR) 40 MG tablet TAKE 1 TABLET AT BEDTIME  . traMADol (ULTRAM) 50 MG tablet Take 1 tablet (50 mg total) by mouth every 6 (six) hours as needed.  . vitamin C (ASCORBIC ACID) 500 MG tablet Take 500 mg by mouth daily.    PHQ 2/9 Scores 04/07/2020 07/15/2019 05/22/2019 03/04/2019  PHQ - 2 Score 0 6 0 -  PHQ- 9 Score - 14 - -  Exception Documentation - - - Patient refusal    GAD 7 : Generalized Anxiety Score 04/07/2020  Nervous, Anxious, on Edge 0  Control/stop worrying 0  Worry too much - different things 0  Trouble relaxing 0  Restless 0  Easily annoyed or irritable 0  Afraid - awful might happen 0  Total GAD 7 Score 0  Anxiety Difficulty Not difficult at all    BP Readings from Last 3 Encounters:  04/07/20 126/72  01/21/20 132/74  07/15/19 114/70    Physical Exam Vitals  and nursing note reviewed.  Constitutional:      General: She is not in acute distress.    Appearance: She is well-developed.  HENT:     Head: Normocephalic and atraumatic.  Cardiovascular:     Rate and Rhythm: Normal rate and regular rhythm.     Pulses: Normal pulses.  Pulmonary:     Effort: Pulmonary effort is normal. No respiratory distress.  Musculoskeletal:     Cervical back: Normal range of motion.       Back:     Comments: Area of discomfort  Lymphadenopathy:     Cervical: No cervical adenopathy.  Skin:    General: Skin is warm and dry.     Capillary Refill: Capillary refill takes less than 2 seconds.     Findings: No rash.  Neurological:     General: No focal deficit present.     Mental Status: She is alert and oriented to person, place, and time.  Psychiatric:        Mood and Affect: Mood normal.        Behavior: Behavior normal.     Wt Readings from Last 3 Encounters:  04/07/20 191 lb (86.6 kg)  01/21/20 191 lb (86.6 kg)  07/15/19 183 lb (83 kg)   Vitals with BMI 04/07/2020 01/21/2020 07/15/2019  Height _0  _1  _2   Weight 191 lbs 191 lbs 183 lbs  BMI 30.84 06.77 03.40  Systolic 352 481 859  Diastolic 72 74 70  Pulse 76 92 74    Assessment and Plan: 1. Essential (primary) hypertension Clinically stable exam with well controlled BP. Tolerating medications without side effects at this time. Pt to continue current regimen and low sodium diet; benefits of regular exercise as able discussed. - Comprehensive metabolic panel  2. Type II diabetes mellitus with complication (HCC) Clinically stable by exam and report without s/s of hypoglycemia. DM complicated by htn. Tolerating medications well without side effects or other concerns (metformin) - Hemoglobin A1c  3. Hyperlipidemia associated with type 2 diabetes mellitus ([place]) Tolerating statin medication without side effects at this time LDL is at goal of < 70 on current dose Continue same therapy  without change at this time. - Lipid panel  4. Lumbar disc herniation with radiculopathy Being seen for SI region sx - to try dry needling next Continue higher dose gabapentin and Celebrex Begin Cymbalta 60 mg daily Has tramadol to use bid prn severe pain - DULoxetine (CYMBALTA) 60 MG  capsule; Take 1 capsule (60 mg total) by mouth daily.  Dispense: 90 capsule; Refill: 1  5. Primary osteoarthritis of both knees Celebrex bid   Partially dictated using Editor, commissioning. Any errors are unintentional.  Halina Maidens, MD [facility]  04/07/2020

## 2020-04-08 LAB — COMPREHENSIVE METABOLIC PANEL
ALT: 35 IU/L — ABNORMAL HIGH (ref 0–32)
AST: 27 IU/L (ref 0–40)
Albumin/Globulin Ratio: 1.8 (ref 1.2–2.2)
Albumin: 4.7 g/dL (ref 3.8–4.8)
Alkaline Phosphatase: 87 IU/L (ref 44–121)
BUN/Creatinine Ratio: 21 (ref 12–28)
BUN: 23 mg/dL (ref 8–27)
Bilirubin Total: 0.4 mg/dL (ref 0.0–1.2)
CO2: 25 mmol/L (ref 20–29)
Calcium: 10.3 mg/dL (ref 8.7–10.3)
Chloride: 103 mmol/L (ref 96–106)
Creatinine, Ser: 1.1 mg/dL — ABNORMAL HIGH (ref 0.57–1.00)
GFR calc Af Amer: 60 mL/min/{1.73_m2} (ref >59)
GFR calc non Af Amer: 52 mL/min/{1.73_m2} — ABNORMAL LOW (ref >59)
Globulin, Total: 2.6 g/dL (ref 1.5–4.5)
Glucose: 151 mg/dL — ABNORMAL HIGH (ref 65–99)
Potassium: 4.8 mmol/L (ref 3.5–5.2)
Sodium: 141 mmol/L (ref 134–144)
Total Protein: 7.3 g/dL (ref 6.0–8.5)

## 2020-04-08 LAB — HEMOGLOBIN A1C
Est. average glucose Bld gHb Est-mCnc: 140 mg/dL
Hgb A1c MFr Bld: 6.5 % — ABNORMAL HIGH (ref 4.8–5.6)

## 2020-04-08 LAB — LIPID PANEL
Chol/HDL Ratio: 2.5 ratio (ref 0.0–4.4)
Cholesterol, Total: 111 mg/dL (ref 100–199)
HDL: 44 mg/dL (ref >39)
LDL Chol Calc (NIH): 39 mg/dL (ref 0–99)
Triglycerides: 171 mg/dL — ABNORMAL HIGH (ref 0–149)
VLDL Cholesterol Cal: 28 mg/dL (ref 5–40)

## 2020-04-20 DIAGNOSIS — R262 Difficulty in walking, not elsewhere classified: Secondary | ICD-10-CM | POA: Diagnosis not present

## 2020-04-20 DIAGNOSIS — M6281 Muscle weakness (generalized): Secondary | ICD-10-CM | POA: Diagnosis not present

## 2020-04-20 DIAGNOSIS — G8929 Other chronic pain: Secondary | ICD-10-CM | POA: Diagnosis not present

## 2020-04-20 DIAGNOSIS — M25552 Pain in left hip: Secondary | ICD-10-CM | POA: Diagnosis not present

## 2020-04-20 DIAGNOSIS — R293 Abnormal posture: Secondary | ICD-10-CM | POA: Diagnosis not present

## 2020-04-20 DIAGNOSIS — M545 Low back pain, unspecified: Secondary | ICD-10-CM | POA: Diagnosis not present

## 2020-05-20 ENCOUNTER — Telehealth: Payer: Self-pay | Admitting: Internal Medicine

## 2020-05-20 NOTE — Telephone Encounter (Signed)
Left message for patient to call back and schedule Medicare Annual Wellness Visit (AWV) either virtually or in office. Whichever the patients preference is.  Last AWV 05/22/2019; please schedule 05/22/2020 OR AFTER with [facility]-Nurse Health Advisor.  This should be a 40 minute visit.

## 2020-05-24 ENCOUNTER — Other Ambulatory Visit: Payer: Self-pay | Admitting: Internal Medicine

## 2020-05-24 DIAGNOSIS — M5116 Intervertebral disc disorders with radiculopathy, lumbar region: Secondary | ICD-10-CM

## 2020-05-24 DIAGNOSIS — I10 Essential (primary) hypertension: Secondary | ICD-10-CM

## 2020-07-14 ENCOUNTER — Other Ambulatory Visit: Payer: Self-pay

## 2020-07-15 ENCOUNTER — Telehealth (INDEPENDENT_AMBULATORY_CARE_PROVIDER_SITE_OTHER): Payer: Medicare HMO | Admitting: Internal Medicine

## 2020-07-15 ENCOUNTER — Encounter: Payer: Self-pay | Admitting: Internal Medicine

## 2020-07-15 VITALS — Temp 97.6°F | Ht 66.0 in

## 2020-07-15 DIAGNOSIS — J0100 Acute maxillary sinusitis, unspecified: Secondary | ICD-10-CM

## 2020-07-15 DIAGNOSIS — R059 Cough, unspecified: Secondary | ICD-10-CM

## 2020-07-15 MED ORDER — MUPIROCIN 2 % EX OINT: TOPICAL_OINTMENT | CUTANEOUS | 5 refills | Status: DC

## 2020-07-15 MED ORDER — PROMETHAZINE-DM 6.25-15 MG/5ML PO SYRP: 5.0000 mL | ORAL_SOLUTION | Freq: Four times a day (QID) | ORAL | 0 refills | Status: DC | PRN

## 2020-07-15 MED ORDER — AMOXICILLIN-POT CLAVULANATE 875-125 MG PO TABS: 1.0000 | ORAL_TABLET | Freq: Two times a day (BID) | ORAL | 0 refills | Status: AC

## 2020-07-15 NOTE — Progress Notes (Signed)
Date:  07/15/2020   Name:  Kiara Campbell   DOB:  06-05-1956   MRN:  [id number]  This encounter was conducted via video encounter due to the need for social distancing in light of the Covid-19 pandemic.  The patient was correctly identified. She is at her home.   I advised that I am conducting the visit from a secure room in my office at [facility] clinic.   The limitations of this form of encounter were discussed with the patient and he/she agreed to proceed.  Some vital signs will be absent.  Chief Complaint: Sinusitis (X2 weeks, post nasal drip, cough (green mucous), no fever, headache, pressure behind eye and nose, no covid test )  Sinusitis This is a new problem. The current episode started 1 to 4 weeks ago. There has been no fever. Associated symptoms include congestion, coughing and sinus pressure. Pertinent negatives include no ear pain, headaches, shortness of breath or sore throat. Past treatments include acetaminophen. The treatment provided mild relief.    Lab Results  Component Value Date   CREATININE 1.10 (H) 04/07/2020   BUN 23 04/07/2020   NA 141 04/07/2020   K 4.8 04/07/2020   CL 103 04/07/2020   CO2 25 04/07/2020   Lab Results  Component Value Date   CHOL 111 04/07/2020   HDL 44 04/07/2020   LDLCALC 39 04/07/2020   TRIG 171 (H) 04/07/2020   CHOLHDL 2.5 04/07/2020   Lab Results  Component Value Date   TSH 2.510 03/04/2019   Lab Results  Component Value Date   HGBA1C 6.5 (H) 04/07/2020   Lab Results  Component Value Date   WBC 7.4 03/04/2019   HGB 9.6 (L) 03/04/2019   HCT 28.8 (L) 03/04/2019   MCV 95 03/04/2019   PLT 320 03/04/2019   Lab Results  Component Value Date   ALT 35 (H) 04/07/2020   AST 27 04/07/2020   ALKPHOS 87 04/07/2020   BILITOT 0.4 04/07/2020     Review of Systems  Constitutional: Negative for fatigue and fever.  HENT: Positive for congestion, postnasal drip and sinus pressure. Negative for ear pain, sore throat and  trouble swallowing.   Respiratory: Positive for cough. Negative for chest tightness, shortness of breath and wheezing.   Cardiovascular: Negative for chest pain and palpitations.  Skin: Positive for rash.  Neurological: Negative for dizziness and headaches.    Patient Active Problem List   Diagnosis Date Noted  . Hx of skin cancer, basal cell 11/19/2018  . Tobacco use disorder, severe, in early remission 08/09/2017  . Emphysema lung ([place]) 07/28/2017  . Obesity (BMI 30-39.9) 07/17/2017  . GERD (gastroesophageal reflux disease) 07/12/2017  . Primary osteoarthritis of both knees 08/01/2016  . Type II diabetes mellitus with complication ([place]) 79/48/0165  . Fibrocystic breast disease 12/03/2014  . Colon polyp 12/03/2014  . Diverticulitis of colon 12/03/2014  . Essential (primary) hypertension 12/03/2014  . 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 OP (osteoporosis) 12/03/2014  . Hyperlipidemia associated with type 2 diabetes mellitus ([place]) 12/03/2014  . Calcium blood increased 12/23/2013    Allergies  Allergen Reactions  . Glipizide Nausea Only    Past Surgical History:  Procedure Laterality Date  . ABDOMINAL HYSTERECTOMY     partial  . APPENDECTOMY    . BREAST CYST ASPIRATION     neg not sure which side  . 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 TONSILLECTOMY    . VARICOSE VEIN SURGERY  Social History   Tobacco Use  . Smoking status: Former Smoker    Packs/day: 0.50    Years: 30.00    Pack years: 15.00    Types: Cigarettes    Quit date: 08/04/2018    Years since quitting: 1.9  . Smokeless tobacco: Never Used  Vaping Use  . Vaping Use: Never used  Substance Use Topics  . Alcohol use: Yes    Comment: occasional  . Drug use: No     Medication list has been reviewed and updated.  Current Meds  Medication Sig  . Alcohol Swabs (B-D SINGLE USE SWABS REGULAR) PADS USE DAILY.  Marland Kitchen allopurinol (ZYLOPRIM) 300 MG tablet TAKE 1 TABLET EVERY DAY  .  amoxicillin-clavulanate (AUGMENTIN) 875-125 MG tablet Take 1 tablet by mouth 2 (two) times daily for 10 days.  Marland Kitchen azelastine (ASTELIN) 0.1 % nasal spray Place 1 spray into both nostrils 2 (two) times daily. Use in each nostril as directed  . Calcium Carb-Cholecalciferol (CALCIUM + D3) 600-200 MG-UNIT TABS Take 1 tablet by mouth 2 (two) times daily.   . celecoxib (CELEBREX) 200 MG capsule TAKE 1 CAPSULE TWICE DAILY  . colchicine (COLCRYS) 0.6 MG tablet Take 1 tablet (0.6 mg total) by mouth 2 (two) times daily.  . DULoxetine (CYMBALTA) 60 MG capsule Take 1 capsule (60 mg total) by mouth daily.  . fexofenadine (ALLEGRA) 60 MG tablet Take 60 mg by mouth 2 (two) times daily.  . fluticasone (FLONASE) 50 MCG/ACT nasal spray   . gabapentin (NEURONTIN) 100 MG capsule TAKE 2 CAPSULES TWICE DAILY  . Lancet Devices (ACCU-CHEK SOFTCLIX) lancets 1 each by Other route daily. Use as instructed  . Lancets Misc. (ACCU-CHEK SOFTCLIX LANCET DEV) KIT 1 each by Does not apply route daily.  Marland Kitchen losartan-hydrochlorothiazide (HYZAAR) 100-25 MG tablet TAKE 1 TABLET EVERY DAY  . metFORMIN (GLUCOPHAGE-XR) 500 MG 24 hr tablet Take 1 tablet (500 mg total) by mouth daily with breakfast.  . metoprolol succinate (TOPROL-XL) 50 MG 24 hr tablet TAKE 3 TABLETS EVERY DAY WITH OR IMMEDIATELY FOLLOWING A MEAL  . mupirocin ointment (BACTROBAN) 2 % PLACE 1 APPLICATION INTO THE NOSE 2 TIMES A DAILY.  Marland Kitchen omeprazole (PRILOSEC) 20 MG capsule TAKE 1 CAPSULE TWICE DAILY BEFORE A MEAL  . psyllium (METAMUCIL SMOOTH TEXTURE) 28 % packet Take 1 packet by mouth 2 (two) times daily.  . simvastatin (ZOCOR) 40 MG tablet TAKE 1 TABLET AT BEDTIME  . traMADol (ULTRAM) 50 MG tablet Take 1 tablet (50 mg total) by mouth every 6 (six) hours as needed.  . vitamin C (ASCORBIC ACID) 500 MG tablet Take 500 mg by mouth daily.    PHQ 2/9 Scores 07/15/2020 04/07/2020 07/15/2019 05/22/2019  PHQ - 2 Score 0 0 6 0  PHQ- 9 Score 1 - 14 -  Exception Documentation - - - -     GAD 7 : Generalized Anxiety Score 07/15/2020 04/07/2020  Nervous, Anxious, on Edge 0 0  Control/stop worrying 0 0  Worry too much - different things 0 0  Trouble relaxing 0 0  Restless 0 0  Easily annoyed or irritable 0 0  Afraid - awful might happen 0 0  Total GAD 7 Score 0 0  Anxiety Difficulty - Not difficult at all    BP Readings from Last 3 Encounters:  04/07/20 126/72  01/21/20 132/74  07/15/19 114/70    Physical Exam Constitutional:      Appearance: She is well-developed and well-nourished.  HENT:  Right Ear: Tympanic membrane is not erythematous or retracted.     Left Ear: Tympanic membrane is not erythematous or retracted.     Nose:     Right Sinus: Maxillary sinus tenderness present. No frontal sinus tenderness.     Left Sinus: Maxillary sinus tenderness present. No frontal sinus tenderness.     Mouth/Throat:     Mouth: Mucous membranes are normal. No oral lesions.     Pharynx: Uvula midline.  Pulmonary:     Effort: Pulmonary effort is normal.     Breath sounds: No wheezing or rales.     Comments: Frequent nasal clearing and cough productive of thick phlegm Lymphadenopathy:     Cervical: No cervical adenopathy.  Neurological:     Mental Status: She is alert and oriented to person, place, and time.  Psychiatric:        Attention and Perception: Attention normal.     Wt Readings from Last 3 Encounters:  04/07/20 191 lb (86.6 kg)  01/21/20 191 lb (86.6 kg)  07/15/19 183 lb (83 kg)    Temp 97.6 F (36.4 C)   Ht _0  (1.676 m)   LMP 09/27/1995   SpO2 95%   BMI 30.83 kg/m   Assessment and Plan: 1. Acute non-recurrent maxillary sinusitis Recommend Coricidin HBP for sx relief - amoxicillin-clavulanate (AUGMENTIN) 875-125 MG tablet; Take 1 tablet by mouth 2 (two) times daily for 10 days.  Dispense: 20 tablet; Refill: 0  2. Cough - promethazine-dextromethorphan (PROMETHAZINE-DM) 6.25-15 MG/5ML syrup; Take 5 mLs by mouth 4 (four) times daily as  needed for cough.  Dispense: 118 mL; Refill: 0  I spent 10 minutes dedicated to the care of this patient on the date of this encounter to include pre-visit review of medication and hx, face to face time with the patient and post visit ordering of testing.  Partially dictated using Editor, commissioning. Any errors are unintentional.  Halina Maidens, MD [facility]  07/15/2020

## 2020-08-07 ENCOUNTER — Ambulatory Visit: Payer: Self-pay | Admitting: *Deleted

## 2020-08-07 NOTE — Telephone Encounter (Signed)
Pt's husband Kiara Campbell called in but put her on the phone for the triage questions.  Starting yesterday she has developed watery diarrhea and fever.  She was put on Penicillin for a sinus infection right after Christmas.  She got better as far as the sinus infection but it never really went away.   All the family was sick and is fine now but she is still sick.  She has an in office appt for Monday.   The agent sent a note to the office regarding changing this appt to a virtual.   Prior to the call being transferred to me for triage at the request of the office Kiara Campbell the husband gave them the phone number and is expecting a call back.  Protocol is for pt to be seen within 24 hours.   She is agreeable to receiving a a call from the office to change the appt.  My notes sent to [facility].      Reason for Disposition . [1] Recent antibiotic therapy (i.e., within last 2 months) AND [2] diarrhea present > 3 days since antibiotic was stopped  Answer Assessment - Initial Assessment Questions 1. DIARRHEA SEVERITY: "How bad is the diarrhea?" "How many extra stools have you had in the past 24 hours than normal?"    - NO DIARRHEA (SCALE 0)   - MILD (SCALE 1-3): Few loose or mushy BMs; increase of 1-3 stools over normal daily number of stools; mild increase in ostomy output.   -  MODERATE (SCALE 4-7): Increase of 4-6 stools daily over normal; moderate increase in ostomy output. * SEVERE (SCALE 8-10; OR 'WORST POSSIBLE'): Increase of 7 or more stools daily over normal; moderate increase in ostomy output; incontinence.     Husband on phone Kiara Campbell. After Christmas we all had a cold.   Kiara Campbell's went into a sinus infection.   Put on antibiotics.   It never went away.   Still runny nose, drainage down back of throat.   Yesterday feeling bad again.   Having diarrhea and fever 102.4 yesterday.  Started Tylenol now 99.7.   Was feeling better but still with diarrhea. No one else is sick just her.  2.  ONSET: "When did the diarrhea begin?"      Yesterday    Is taking diarrhea medicine Imodium AD. 3. BM CONSISTENCY: "How loose or watery is the diarrhea?"      Watery  She was on antibiotics.  Penicillin.    4. VOMITING: "Are you also vomiting?" If Yes, ask: "How many times in the past 24 hours?"      No V/N 5. ABDOMINAL PAIN: "Are you having any abdominal pain?" If Yes, ask: "What does it feel like?" (e.g., crampy, dull, intermittent, constant)      Some abd pain with diarrhea. 6. ABDOMINAL PAIN SEVERITY: If present, ask: "How bad is the pain?"  (e.g., Scale 1-10; mild, moderate, or severe)   - MILD (1-3): doesn't interfere with normal activities, abdomen soft and not tender to touch    - MODERATE (4-7): interferes with normal activities or awakens from sleep, tender to touch    - SEVERE (8-10): excruciating pain, doubled over, unable to do any normal activities       With cramping a 2 on scale.   A pain in right hip started 2 days ago.   Still having pain in right pain.   Have a history of arthritis in right hip. 7. ORAL INTAKE: If vomiting, "Have  you been able to drink liquids?" "How much fluids have you had in the past 24 hours?"     I'm drinking water.  2-3 bottles of water a day.   8. HYDRATION: "Any signs of dehydration?" (e.g., dry mouth [not just dry lips], too weak to stand, dizziness, new weight loss) "When did you last urinate?"     No dizziness or weakness.    I ate 1/2 [employer] sandwich last night and had diarrhea right after that. 9. EXPOSURE: "Have you traveled to a foreign country recently?" "Have you been exposed to anyone with diarrhea?" "Could you have eaten any food that was spoiled?"     No  No one else in family is sick. 10. ANTIBIOTIC USE: "Are you taking antibiotics now or have you taken antibiotics in the past 2 months?"       Yes Penicillin was taken.   No longer having green mucus in my nose.   It's white now but a lot of stuff I'm blowing oiut. 11. OTHER SYMPTOMS: "Do you  have any other symptoms?" (e.g., fever, blood in stool)       102.4 last night   Took Tylenol and temp came down. 12. PREGNANCY: "Is there any chance you are pregnant?" "When was your last menstrual period?"       Not asked  Protocols used: DIARRHEA-A-AH

## 2020-08-09 DIAGNOSIS — I10 Essential (primary) hypertension: Secondary | ICD-10-CM | POA: Diagnosis not present

## 2020-08-09 DIAGNOSIS — R8271 Bacteriuria: Secondary | ICD-10-CM | POA: Diagnosis not present

## 2020-08-09 DIAGNOSIS — K219 Gastro-esophageal reflux disease without esophagitis: Secondary | ICD-10-CM | POA: Diagnosis not present

## 2020-08-09 DIAGNOSIS — M549 Dorsalgia, unspecified: Secondary | ICD-10-CM | POA: Diagnosis not present

## 2020-08-09 DIAGNOSIS — R918 Other nonspecific abnormal finding of lung field: Secondary | ICD-10-CM | POA: Diagnosis not present

## 2020-08-09 DIAGNOSIS — K529 Noninfective gastroenteritis and colitis, unspecified: Secondary | ICD-10-CM | POA: Diagnosis not present

## 2020-08-09 DIAGNOSIS — I499 Cardiac arrhythmia, unspecified: Secondary | ICD-10-CM | POA: Diagnosis not present

## 2020-08-09 DIAGNOSIS — R159 Full incontinence of feces: Secondary | ICD-10-CM | POA: Diagnosis not present

## 2020-08-09 DIAGNOSIS — D649 Anemia, unspecified: Secondary | ICD-10-CM | POA: Diagnosis not present

## 2020-08-09 DIAGNOSIS — K429 Umbilical hernia without obstruction or gangrene: Secondary | ICD-10-CM | POA: Diagnosis not present

## 2020-08-09 DIAGNOSIS — A0472 Enterocolitis due to Clostridium difficile, not specified as recurrent: Secondary | ICD-10-CM | POA: Diagnosis not present

## 2020-08-09 DIAGNOSIS — R590 Localized enlarged lymph nodes: Secondary | ICD-10-CM | POA: Diagnosis not present

## 2020-08-09 DIAGNOSIS — R42 Dizziness and giddiness: Secondary | ICD-10-CM | POA: Diagnosis not present

## 2020-08-09 DIAGNOSIS — E119 Type 2 diabetes mellitus without complications: Secondary | ICD-10-CM | POA: Diagnosis not present

## 2020-08-09 DIAGNOSIS — U071 COVID-19: Secondary | ICD-10-CM | POA: Diagnosis not present

## 2020-08-09 DIAGNOSIS — R000 Tachycardia, unspecified: Secondary | ICD-10-CM | POA: Diagnosis not present

## 2020-08-09 DIAGNOSIS — R0602 Shortness of breath: Secondary | ICD-10-CM | POA: Diagnosis not present

## 2020-08-09 DIAGNOSIS — R509 Fever, unspecified: Secondary | ICD-10-CM | POA: Diagnosis not present

## 2020-08-09 DIAGNOSIS — K760 Fatty (change of) liver, not elsewhere classified: Secondary | ICD-10-CM | POA: Diagnosis not present

## 2020-08-09 DIAGNOSIS — R1031 Right lower quadrant pain: Secondary | ICD-10-CM | POA: Diagnosis not present

## 2020-08-10 ENCOUNTER — Ambulatory Visit: Payer: Medicare HMO | Admitting: Internal Medicine

## 2020-08-10 DIAGNOSIS — U071 COVID-19: Secondary | ICD-10-CM | POA: Diagnosis not present

## 2020-08-10 DIAGNOSIS — R000 Tachycardia, unspecified: Secondary | ICD-10-CM | POA: Diagnosis not present

## 2020-08-10 DIAGNOSIS — A0472 Enterocolitis due to Clostridium difficile, not specified as recurrent: Secondary | ICD-10-CM | POA: Diagnosis not present

## 2020-08-10 DIAGNOSIS — I10 Essential (primary) hypertension: Secondary | ICD-10-CM | POA: Diagnosis not present

## 2020-08-10 DIAGNOSIS — D649 Anemia, unspecified: Secondary | ICD-10-CM | POA: Diagnosis not present

## 2020-08-10 DIAGNOSIS — K5000 Crohn's disease of small intestine without complications: Secondary | ICD-10-CM | POA: Diagnosis not present

## 2020-08-10 DIAGNOSIS — E119 Type 2 diabetes mellitus without complications: Secondary | ICD-10-CM | POA: Diagnosis not present

## 2020-08-10 DIAGNOSIS — R1031 Right lower quadrant pain: Secondary | ICD-10-CM | POA: Diagnosis not present

## 2020-08-10 DIAGNOSIS — K219 Gastro-esophageal reflux disease without esophagitis: Secondary | ICD-10-CM | POA: Diagnosis not present

## 2020-08-10 DIAGNOSIS — R159 Full incontinence of feces: Secondary | ICD-10-CM | POA: Diagnosis not present

## 2020-08-10 DIAGNOSIS — M549 Dorsalgia, unspecified: Secondary | ICD-10-CM | POA: Diagnosis not present

## 2020-08-10 DIAGNOSIS — R10813 Right lower quadrant abdominal tenderness: Secondary | ICD-10-CM | POA: Diagnosis not present

## 2020-08-10 NOTE — Telephone Encounter (Signed)
Called Husband to follow up this morning. Patient ended up in the hospital and tested POS for Covid. Is admitted to [facility]. Canceled todays appt.

## 2020-08-11 DIAGNOSIS — R000 Tachycardia, unspecified: Secondary | ICD-10-CM | POA: Diagnosis not present

## 2020-08-11 DIAGNOSIS — M549 Dorsalgia, unspecified: Secondary | ICD-10-CM | POA: Diagnosis not present

## 2020-08-11 DIAGNOSIS — D72829 Elevated white blood cell count, unspecified: Secondary | ICD-10-CM | POA: Diagnosis not present

## 2020-08-11 DIAGNOSIS — U071 COVID-19: Secondary | ICD-10-CM | POA: Diagnosis not present

## 2020-08-11 DIAGNOSIS — I10 Essential (primary) hypertension: Secondary | ICD-10-CM | POA: Diagnosis not present

## 2020-08-11 DIAGNOSIS — R159 Full incontinence of feces: Secondary | ICD-10-CM | POA: Diagnosis not present

## 2020-08-11 DIAGNOSIS — D649 Anemia, unspecified: Secondary | ICD-10-CM | POA: Diagnosis not present

## 2020-08-11 DIAGNOSIS — A0472 Enterocolitis due to Clostridium difficile, not specified as recurrent: Secondary | ICD-10-CM | POA: Diagnosis not present

## 2020-08-11 DIAGNOSIS — K5000 Crohn's disease of small intestine without complications: Secondary | ICD-10-CM | POA: Diagnosis not present

## 2020-08-11 DIAGNOSIS — R1031 Right lower quadrant pain: Secondary | ICD-10-CM | POA: Diagnosis not present

## 2020-08-11 DIAGNOSIS — E119 Type 2 diabetes mellitus without complications: Secondary | ICD-10-CM | POA: Diagnosis not present

## 2020-08-11 DIAGNOSIS — K219 Gastro-esophageal reflux disease without esophagitis: Secondary | ICD-10-CM | POA: Diagnosis not present

## 2020-08-12 DIAGNOSIS — K219 Gastro-esophageal reflux disease without esophagitis: Secondary | ICD-10-CM | POA: Diagnosis not present

## 2020-08-12 DIAGNOSIS — E119 Type 2 diabetes mellitus without complications: Secondary | ICD-10-CM | POA: Diagnosis not present

## 2020-08-12 DIAGNOSIS — K5000 Crohn's disease of small intestine without complications: Secondary | ICD-10-CM | POA: Diagnosis not present

## 2020-08-12 DIAGNOSIS — R159 Full incontinence of feces: Secondary | ICD-10-CM | POA: Diagnosis not present

## 2020-08-12 DIAGNOSIS — M549 Dorsalgia, unspecified: Secondary | ICD-10-CM | POA: Diagnosis not present

## 2020-08-12 DIAGNOSIS — D649 Anemia, unspecified: Secondary | ICD-10-CM | POA: Diagnosis not present

## 2020-08-12 DIAGNOSIS — I10 Essential (primary) hypertension: Secondary | ICD-10-CM | POA: Diagnosis not present

## 2020-08-12 DIAGNOSIS — U071 COVID-19: Secondary | ICD-10-CM | POA: Diagnosis not present

## 2020-08-12 DIAGNOSIS — R000 Tachycardia, unspecified: Secondary | ICD-10-CM | POA: Diagnosis not present

## 2020-08-12 DIAGNOSIS — R1031 Right lower quadrant pain: Secondary | ICD-10-CM | POA: Diagnosis not present

## 2020-08-12 DIAGNOSIS — A0472 Enterocolitis due to Clostridium difficile, not specified as recurrent: Secondary | ICD-10-CM | POA: Diagnosis not present

## 2020-08-13 ENCOUNTER — Telehealth: Payer: Medicare HMO | Admitting: Internal Medicine

## 2020-09-22 ENCOUNTER — Other Ambulatory Visit: Payer: Self-pay | Admitting: Internal Medicine

## 2020-09-22 DIAGNOSIS — I10 Essential (primary) hypertension: Secondary | ICD-10-CM

## 2020-09-22 DIAGNOSIS — E1169 Type 2 diabetes mellitus with other specified complication: Secondary | ICD-10-CM

## 2020-09-22 NOTE — Telephone Encounter (Signed)
Requested Prescriptions  Pending Prescriptions Disp Refills  . losartan-hydrochlorothiazide (HYZAAR) 100-25 MG tablet [Pharmacy Med Name: LOSARTAN POTASSIUM/HYDROCHLOROTHIAZIDE 100-25 MG Tablet] 90 tablet 0    Sig: TAKE 1 TABLET EVERY DAY     Cardiovascular: ARB + Diuretic Combos Failed - 09/22/2020 11:32 AM      Failed - Cr in normal range and within 180 days    Creatinine, Ser  Date Value Ref Range Status  04/07/2020 1.10 (H) 0.57 - 1.00 mg/dL Final         Passed - K in normal range and within 180 days    Potassium  Date Value Ref Range Status  04/07/2020 4.8 3.5 - 5.2 mmol/L Final         Passed - Na in normal range and within 180 days    Sodium  Date Value Ref Range Status  04/07/2020 141 134 - 144 mmol/L Final         Passed - Ca in normal range and within 180 days    Calcium  Date Value Ref Range Status  04/07/2020 10.3 8.7 - 10.3 mg/dL Final         Passed - Patient is not pregnant      Passed - Last BP in normal range    BP Readings from Last 1 Encounters:  04/07/20 126/72         Passed - Valid encounter within last 6 months    Recent Outpatient Visits          2 months ago Acute non-recurrent maxillary sinusitis   [facility] Glean Hess, [facility]   5 months ago Essential (primary) hypertension   [facility] Glean Hess, MD   8 months ago Lumbar disc herniation with radiculopathy   [facility] Glean [facility], [facility]   1 year ago Pulmonary emphysema, unspecified emphysema type [facility])   [facility] Glean Hess, [facility]   1 year ago Annual physical exam   [facility] Glean Hess, MD             . simvastatin (ZOCOR) 40 MG tablet [Pharmacy Med Name: SIMVASTATIN 40 MG Tablet] 90 tablet 1    Sig: TAKE 1 TABLET AT BEDTIME     Cardiovascular:  Antilipid - Statins Failed - 09/22/2020 11:32 AM      Failed - LDL in normal range and within 360 days    LDL Chol Calc (NIH)  Date Value Ref Range  Status  04/07/2020 39 0 - 99 mg/dL Final         Failed - Triglycerides in normal range and within 360 days    Triglycerides  Date Value Ref Range Status  04/07/2020 171 (H) 0 - 149 mg/dL Final         Passed - Total Cholesterol in normal range and within 360 days    Cholesterol, Total  Date Value Ref Range Status  04/07/2020 111 100 - 199 mg/dL Final         Passed - HDL in normal range and within 360 days    HDL  Date Value Ref Range Status  04/07/2020 44 >39 mg/dL Final         Passed - Patient is not pregnant      Passed - Valid encounter within last 12 months    Recent Outpatient Visits          2 months ago Acute non-recurrent maxillary sinusitis   [facility]  Glean Hess, MD   5 months ago Essential (primary) hypertension   [facility] Glean Hess, MD   8 months ago Lumbar disc herniation with radiculopathy   [facility] Glean Hess, MD   1 year ago Pulmonary emphysema, unspecified emphysema type [facility])   [facility] Glean Hess, MD   1 year ago Annual physical exam   [facility] Glean Hess, MD

## 2020-09-24 ENCOUNTER — Other Ambulatory Visit: Payer: Self-pay

## 2020-09-24 ENCOUNTER — Telehealth: Payer: Self-pay

## 2020-09-24 DIAGNOSIS — I10 Essential (primary) hypertension: Secondary | ICD-10-CM

## 2020-09-24 NOTE — Telephone Encounter (Signed)
Completed PA waiting for insurance approval.   Key: [id number]  KP

## 2020-10-02 ENCOUNTER — Telehealth: Payer: Self-pay | Admitting: Internal Medicine

## 2020-10-02 NOTE — Telephone Encounter (Signed)
Left message for patient to call back and schedule Medicare Annual Wellness Visit (AWV) either virtually or in office. No detailed message left    Last AWV 05/22/2019  please schedule at anytime   This should be a 40 minute visit.

## 2020-10-26 ENCOUNTER — Other Ambulatory Visit: Payer: Self-pay | Admitting: Internal Medicine

## 2020-10-26 DIAGNOSIS — M10071 Idiopathic gout, right ankle and foot: Secondary | ICD-10-CM

## 2020-10-26 DIAGNOSIS — I10 Essential (primary) hypertension: Secondary | ICD-10-CM

## 2020-10-26 DIAGNOSIS — E119 Type 2 diabetes mellitus without complications: Secondary | ICD-10-CM

## 2020-10-27 NOTE — Telephone Encounter (Signed)
Requested medication (s) are due for refill today: yes  Requested medication (s) are on the active medication list:  yes  Last refill:  09/24/2020  Future visit scheduled: no  Notes to clinic:  Patient due for follow up appointment on 08/28/2020   Requested Prescriptions  Pending Prescriptions Disp Refills   losartan-hydrochlorothiazide (HYZAAR) 100-25 MG tablet [Pharmacy Med Name: LOSARTAN POTASSIUM/HYDROCHLOROTHIAZIDE 100-25 MG Tablet] 90 tablet 0    Sig: TAKE 1 TABLET EVERY DAY      Cardiovascular: ARB + Diuretic Combos Failed - 10/26/2020 11:33 PM      Failed - K in normal range and within 180 days    Potassium  Date Value Ref Range Status  04/07/2020 4.8 3.5 - 5.2 mmol/L Final          Failed - Na in normal range and within 180 days    Sodium  Date Value Ref Range Status  04/07/2020 141 134 - 144 mmol/L Final          Failed - Cr in normal range and within 180 days    Creatinine, Ser  Date Value Ref Range Status  04/07/2020 1.10 (H) 0.57 - 1.00 mg/dL Final          Failed - Ca in normal range and within 180 days    Calcium  Date Value Ref Range Status  04/07/2020 10.3 8.7 - 10.3 mg/dL Final          Passed - Patient is not pregnant      Passed - Last BP in normal range    BP Readings from Last 1 Encounters:  04/07/20 126/72          Passed - Valid encounter within last 6 months    Recent Outpatient Visits           3 months ago Acute non-recurrent maxillary sinusitis   [facility] Glean Hess, MD   6 months ago Essential (primary) hypertension   [facility] Glean Hess, MD   9 months ago Lumbar disc herniation with radiculopathy   [facility] Glean Hess, [facility]   1 year ago Pulmonary emphysema, unspecified emphysema type [facility])   [facility] Glean Hess, MD   1 year ago Annual physical exam   [facility] Glean Hess, MD                  allopurinol (ZYLOPRIM) 300  MG tablet [Pharmacy Med Name: ALLOPURINOL 300 MG Tablet] 90 tablet 1    Sig: TAKE 1 [place]      Endocrinology:  Gout Agents Failed - 10/26/2020 11:33 PM      Failed - Uric Acid in normal range and within 360 days    Uric Acid  Date Value Ref Range Status  02/28/2018 4.7 2.5 - 7.1 mg/dL Final    Comment:               Therapeutic target for gout patients: <6.0          Failed - Cr in normal range and within 360 days    Creatinine, Ser  Date Value Ref Range Status  04/07/2020 1.10 (H) 0.57 - 1.00 mg/dL Final          Passed - Valid encounter within last 12 months    Recent Outpatient Visits           3 months ago Acute non-recurrent maxillary sinusitis   [facility], [facility]   6 months ago Essential (primary) hypertension   [facility] Glean Hess, MD   9 months ago Lumbar disc herniation with radiculopathy   [facility] Glean Hess, MD   1 year ago Pulmonary emphysema, unspecified emphysema type [facility])   [facility] Glean Hess, MD   1 year ago Annual physical exam   [facility] Glean Hess, MD                  metFORMIN (GLUCOPHAGE-XR) 500 MG 24 hr tablet [Pharmacy Med Name: METFORMIN HYDROCHLORIDE ER 500 MG Tablet Extended Release 24 Hour] 90 tablet 3    Sig: Take 1 tablet (500 mg total) by mouth daily with breakfast.      Endocrinology:  Diabetes - Biguanides Failed - 10/26/2020 11:33 PM      Failed - Cr in normal range and within 360 days    Creatinine, Ser  Date Value Ref Range Status  04/07/2020 1.10 (H) 0.57 - 1.00 mg/dL Final          Failed - HBA1C is between 0 and 7.9 and within 180 days    Hgb A1c MFr Bld  Date Value Ref Range Status  04/07/2020 6.5 (H) 4.8 - 5.6 % Final    Comment:             Prediabetes: 5.7 - 6.4          Diabetes: >6.4          Glycemic control for adults with diabetes: <7.0           Failed - eGFR in normal range and within  360 days    GFR calc Af Amer  Date Value Ref Range Status  04/07/2020 60 >59 mL/min/1.73 Final    Comment:    **[facility] currently reports eGFR in compliance with the current**   recommendations of the [employer]. [facility] will   update reporting as new guidelines are published from the NKF-ASN   Task force.    GFR calc non Af Amer  Date Value Ref Range Status  04/07/2020 52 (L) >59 mL/min/1.73 Final          Passed - Valid encounter within last 6 months    Recent Outpatient Visits           3 months ago Acute non-recurrent maxillary sinusitis   [facility] Glean Hess, MD   6 months ago Essential (primary) hypertension   [facility] Glean Hess, MD   9 months ago Lumbar disc herniation with radiculopathy   [facility] Glean Hess, MD   1 year ago Pulmonary emphysema, unspecified emphysema type [facility])   [facility] Glean Hess, MD   1 year ago Annual physical exam   [facility] Glean Hess, MD

## 2020-10-27 NOTE — Telephone Encounter (Signed)
Called pt and left a message.

## 2020-10-27 NOTE — Telephone Encounter (Signed)
Please call pt to set up and appt for DM.  KP

## 2020-11-02 ENCOUNTER — Ambulatory Visit: Payer: Medicare HMO | Admitting: Internal Medicine

## 2020-11-11 ENCOUNTER — Encounter: Payer: Self-pay | Admitting: Internal Medicine

## 2020-11-11 ENCOUNTER — Ambulatory Visit (INDEPENDENT_AMBULATORY_CARE_PROVIDER_SITE_OTHER): Payer: Medicare HMO | Admitting: Internal Medicine

## 2020-11-11 ENCOUNTER — Other Ambulatory Visit: Payer: Self-pay | Admitting: Internal Medicine

## 2020-11-11 ENCOUNTER — Other Ambulatory Visit: Payer: Self-pay

## 2020-11-11 VITALS — BP 132/78 | HR 89 | Ht 66.0 in | Wt 190.0 lb

## 2020-11-11 DIAGNOSIS — M5116 Intervertebral disc disorders with radiculopathy, lumbar region: Secondary | ICD-10-CM

## 2020-11-11 DIAGNOSIS — I10 Essential (primary) hypertension: Secondary | ICD-10-CM | POA: Diagnosis not present

## 2020-11-11 DIAGNOSIS — M170 Bilateral primary osteoarthritis of knee: Secondary | ICD-10-CM | POA: Diagnosis not present

## 2020-11-11 DIAGNOSIS — E118 Type 2 diabetes mellitus with unspecified complications: Secondary | ICD-10-CM | POA: Diagnosis not present

## 2020-11-11 DIAGNOSIS — R300 Dysuria: Secondary | ICD-10-CM | POA: Diagnosis not present

## 2020-11-11 LAB — POCT URINALYSIS DIPSTICK
Bilirubin, UA: NEGATIVE
Blood, UA: NEGATIVE
Glucose, UA: NEGATIVE
Ketones, UA: NEGATIVE
Leukocytes, UA: NEGATIVE
Nitrite, UA: NEGATIVE
Protein, UA: NEGATIVE
Spec Grav, UA: 1.025 (ref 1.010–1.025)
Urobilinogen, UA: 0.2 E.U./dL
pH, UA: 5 (ref 5.0–8.0)

## 2020-11-11 MED ORDER — TRAMADOL HCL 50 MG PO TABS: 50.0000 mg | ORAL_TABLET | Freq: Four times a day (QID) | ORAL | 2 refills | Status: DC | PRN

## 2020-11-11 NOTE — Progress Notes (Signed)
Date:  11/11/2020   Name:  Kiara Campbell   DOB:  01-Jul-1956   MRN:  [id number]   Chief Complaint: Urinary Tract Infection (Burning while urinating, frequency, and less urine while urinating.)  Dysuria  This is a new problem. The current episode started yesterday. The problem occurs every urination. The pain is mild. There has been no fever. Pertinent negatives include no chills, flank pain, hematuria, nausea, sweats or urgency. Treatments tried: treated a vaginal yeast infection with over the counter medication. The treatment provided significant relief.  Diabetes She presents for her follow-up diabetic visit. She has type 2 diabetes mellitus. Her disease course has been stable. Hypoglycemia symptoms include nervousness/anxiousness. Pertinent negatives for hypoglycemia include no dizziness or sweats. Pertinent negatives for diabetes include no blurred vision, no chest pain, no fatigue, no foot paresthesias and no weight loss. Symptoms are stable. Current diabetic treatment includes oral agent (monotherapy) (metformin). She is compliant with treatment all of the time. Her weight is stable. An ACE inhibitor/angiotensin II receptor blocker is being taken.  Back Pain This is a chronic problem. Associated symptoms include dysuria. Pertinent negatives include no chest pain, pelvic pain or weight loss. She has tried analgesics for the symptoms. The treatment provided moderate relief.  Hypertension This is a chronic problem. The problem is controlled. Associated symptoms include shortness of breath. Pertinent negatives include no blurred vision, chest pain or sweats. Past treatments include angiotensin blockers, diuretics and beta blockers. The current treatment provides significant improvement.    Lab Results  Component Value Date   CREATININE 1.10 (H) 04/07/2020   BUN 23 04/07/2020   NA 141 04/07/2020   K 4.8 04/07/2020   CL 103 04/07/2020   CO2 25 04/07/2020   Lab Results  Component Value  Date   CHOL 111 04/07/2020   HDL 44 04/07/2020   LDLCALC 39 04/07/2020   TRIG 171 (H) 04/07/2020   CHOLHDL 2.5 04/07/2020   Lab Results  Component Value Date   TSH 2.510 03/04/2019   Lab Results  Component Value Date   HGBA1C 6.5 (H) 04/07/2020   Lab Results  Component Value Date   WBC 7.4 03/04/2019   HGB 9.6 (L) 03/04/2019   HCT 28.8 (L) 03/04/2019   MCV 95 03/04/2019   PLT 320 03/04/2019   Lab Results  Component Value Date   ALT 35 (H) 04/07/2020   AST 27 04/07/2020   ALKPHOS 87 04/07/2020   BILITOT 0.4 04/07/2020     Review of Systems  Constitutional: Negative for chills, fatigue, unexpected weight change and weight loss.  Eyes: Negative for blurred vision.  Respiratory: Positive for shortness of breath. Negative for choking and wheezing.   Cardiovascular: Negative for chest pain.  Gastrointestinal: Negative for nausea.  Genitourinary: Positive for dysuria. Negative for flank pain, hematuria, pelvic pain, urgency and vaginal bleeding.  Musculoskeletal: Positive for back pain and gait problem.  Neurological: Negative for dizziness.  Psychiatric/Behavioral: Negative for dysphoric mood, sleep disturbance and suicidal ideas. The patient is nervous/anxious.     Patient Active Problem List   Diagnosis Date Noted  . Hx of skin cancer, basal cell 11/19/2018  . Tobacco use disorder, severe, in early remission 08/09/2017  . Emphysema lung ([place]) 07/28/2017  . Obesity (BMI 30-39.9) 07/17/2017  . GERD (gastroesophageal reflux disease) 07/12/2017  . Primary osteoarthritis of both knees 08/01/2016  . Type II diabetes mellitus with complication ([place]) 17/00/1749  . Fibrocystic breast disease 12/03/2014  . Colon polyp 12/03/2014  .  Diverticulitis of colon 12/03/2014  . Essential (primary) hypertension 12/03/2014  . 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 OP (osteoporosis) 12/03/2014  . Hyperlipidemia associated with type 2 diabetes mellitus ([place])  12/03/2014  . Calcium blood increased 12/23/2013    Allergies  Allergen Reactions  . Glipizide Nausea Only    Past Surgical History:  Procedure Laterality Date  . ABDOMINAL HYSTERECTOMY     partial  . APPENDECTOMY    . BREAST CYST ASPIRATION     neg not sure which side  . 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 TONSILLECTOMY    . VARICOSE VEIN SURGERY      Social History   Tobacco Use  . Smoking status: Former Smoker    Packs/day: 0.50    Years: 30.00    Pack years: 15.00    Types: Cigarettes    Quit date: 08/04/2018    Years since quitting: 2.2  . Smokeless tobacco: Never Used  Vaping Use  . Vaping Use: Never used  Substance Use Topics  . Alcohol use: Yes    Comment: occasional  . Drug use: No     Medication list has been reviewed and updated.  Current Meds  Medication Sig  . Alcohol Swabs (B-D SINGLE USE SWABS REGULAR) PADS USE DAILY.  Marland Kitchen allopurinol (ZYLOPRIM) 300 MG tablet TAKE 1 TABLET EVERY DAY  . azelastine (ASTELIN) 0.1 % nasal spray Place 1 spray into both nostrils 2 (two) times daily. Use in each nostril as directed  . Calcium Carb-Ergocalciferol 500-200 MG-UNIT TABS Take by mouth.  . celecoxib (CELEBREX) 200 MG capsule TAKE 1 CAPSULE TWICE DAILY  . colchicine (COLCRYS) 0.6 MG tablet Take 1 tablet (0.6 mg total) by mouth 2 (two) times daily.  . DULoxetine (CYMBALTA) 60 MG capsule Take 1 capsule (60 mg total) by mouth daily.  . famotidine (PEPCID) 20 MG tablet Take 1 tablet by mouth daily.  . fexofenadine (ALLEGRA) 60 MG tablet Take 60 mg by mouth 2 (two) times daily.  . fluticasone (FLONASE) 50 MCG/ACT nasal spray   . gabapentin (NEURONTIN) 100 MG capsule TAKE 2 CAPSULES TWICE DAILY  . Lancet Devices (ACCU-CHEK SOFTCLIX) lancets 1 each by Other route daily. Use as instructed  . Lancets Misc. (ACCU-CHEK SOFTCLIX LANCET DEV) KIT 1 each by Does not apply route daily.  Marland Kitchen losartan-hydrochlorothiazide (HYZAAR) 100-25 MG tablet TAKE 1 TABLET EVERY DAY  .  metFORMIN (GLUCOPHAGE-XR) 500 MG 24 hr tablet TAKE 1 TABLET (500 MG TOTAL) BY MOUTH DAILY WITH BREAKFAST.  . metoprolol succinate (TOPROL-XL) 50 MG 24 hr tablet TAKE 3 TABLETS EVERY DAY WITH OR IMMEDIATELY FOLLOWING A MEAL  . mupirocin ointment (BACTROBAN) 2 % PLACE 1 APPLICATION INTO THE NOSE 2 TIMES A DAILY.  Marland Kitchen omeprazole (PRILOSEC) 20 MG capsule TAKE 1 CAPSULE TWICE DAILY BEFORE A MEAL  . promethazine-dextromethorphan (PROMETHAZINE-DM) 6.25-15 MG/5ML syrup Take 5 mLs by mouth 4 (four) times daily as needed for cough.  . psyllium (METAMUCIL SMOOTH TEXTURE) 28 % packet Take 1 packet by mouth 2 (two) times daily.  . simvastatin (ZOCOR) 40 MG tablet TAKE 1 TABLET AT BEDTIME  . traMADol (ULTRAM) 50 MG tablet Take 1 tablet (50 mg total) by mouth every 6 (six) hours as needed.  . vitamin C (ASCORBIC ACID) 500 MG tablet Take 500 mg by mouth daily.    PHQ 2/9 Scores 11/11/2020 07/15/2020 04/07/2020 07/15/2019  PHQ - 2 Score 1 0 0 6  PHQ- 9 Score 2 1 - 14  Exception Documentation - - - -  GAD 7 : Generalized Anxiety Score 11/11/2020 07/15/2020 04/07/2020  Nervous, Anxious, on Edge 2 0 0  Control/stop worrying 1 0 0  Worry too much - different things 1 0 0  Trouble relaxing 1 0 0  Restless 0 0 0  Easily annoyed or irritable 2 0 0  Afraid - awful might happen 1 0 0  Total GAD 7 Score 8 0 0  Anxiety Difficulty Somewhat difficult - Not difficult at all    BP Readings from Last 3 Encounters:  11/11/20 132/78  04/07/20 126/72  01/21/20 132/74    Physical Exam Vitals and nursing note reviewed.  Constitutional:      General: She is not in acute distress.    Appearance: Normal appearance. She is well-developed.  HENT:     Head: Normocephalic and atraumatic.  Cardiovascular:     Rate and Rhythm: Normal rate and regular rhythm.     Pulses: Normal pulses.     Heart sounds: No murmur heard.   Pulmonary:     Effort: Pulmonary effort is normal. No respiratory distress.     Breath sounds: No  wheezing or rhonchi.  Abdominal:     Palpations: Abdomen is soft.     Tenderness: There is no abdominal tenderness. There is no guarding.  Musculoskeletal:     Cervical back: Normal range of motion.     Right lower leg: No edema.     Left lower leg: No edema.  Skin:    General: Skin is warm and dry.     Capillary Refill: Capillary refill takes less than 2 seconds.     Findings: No rash.  Neurological:     General: No focal deficit present.     Mental Status: She is alert and oriented to person, place, and time.  Psychiatric:        Mood and Affect: Mood normal.        Behavior: Behavior normal.     Wt Readings from Last 3 Encounters:  11/11/20 190 lb (86.2 kg)  04/07/20 191 lb (86.6 kg)  01/21/20 191 lb (86.6 kg)    BP 132/78   Pulse 89   Ht _0  (1.676 m)   Wt 190 lb (86.2 kg)   LMP 09/27/1995   SpO2 95%   BMI 30.67 kg/m   Assessment and Plan: 1. Dysuria UA is negative - pt will continue fluids Monitor for worsening - POCT Urinalysis Dipstick  2. Essential (primary) hypertension Clinically stable exam with well controlled BP on three agents. Tolerating medications without side effects at this time. Pt to continue current regimen and low sodium diet; benefits of regular exercise as able discussed. - CBC with Differential/Platelet  3. Type II diabetes mellitus with complication (HCC) Clinically stable by exam and report without s/s of hypoglycemia. DM complicated by HTN. Tolerating medications - metformin and glipzide -  well without side effects or other concerns. - Hemoglobin A1c  4. Lumbar disc herniation with radiculopathy Chronic low back pain not a candidate for surgery; failed ESI, PT - traMADol (ULTRAM) 50 MG tablet; Take 1 tablet (50 mg total) by mouth every 6 (six) hours as needed.  Dispense: 60 tablet; Refill: 2  5. Primary osteoarthritis of both knees - traMADol (ULTRAM) 50 MG tablet; Take 1 tablet (50 mg total) by mouth every 6 (six) hours as  needed.  Dispense: 60 tablet; Refill: 2     Partially dictated using Editor, commissioning. Any errors are unintentional.  Halina Maidens, MD [place]  [facility]  11/11/2020

## 2020-11-12 LAB — CBC WITH DIFFERENTIAL/PLATELET
Basophils Absolute: 0.1 10*3/uL (ref 0.0–0.2)
Basos: 1 %
EOS (ABSOLUTE): 0.5 10*3/uL — ABNORMAL HIGH (ref 0.0–0.4)
Eos: 6 %
Hematocrit: 23.9 % — ABNORMAL LOW (ref 34.0–46.6)
Hemoglobin: 7.8 g/dL — ABNORMAL LOW (ref 11.1–15.9)
Immature Grans (Abs): 0 10*3/uL (ref 0.0–0.1)
Immature Granulocytes: 0 %
Lymphocytes Absolute: 2.8 10*3/uL (ref 0.7–3.1)
Lymphs: 36 %
MCH: 31.3 pg (ref 26.6–33.0)
MCHC: 32.6 g/dL (ref 31.5–35.7)
MCV: 96 fL (ref 79–97)
Monocytes Absolute: 1.2 10*3/uL — ABNORMAL HIGH (ref 0.1–0.9)
Monocytes: 15 %
Neutrophils Absolute: 3.4 10*3/uL (ref 1.4–7.0)
Neutrophils: 42 %
Platelets: 301 10*3/uL (ref 150–450)
RBC: 2.49 x10E6/uL — CL (ref 3.77–5.28)
RDW: 20.7 % — ABNORMAL HIGH (ref 11.7–15.4)
WBC: 8 10*3/uL (ref 3.4–10.8)

## 2020-11-12 LAB — HEMOGLOBIN A1C
Est. average glucose Bld gHb Est-mCnc: 128 mg/dL
Hgb A1c MFr Bld: 6.1 % — ABNORMAL HIGH (ref 4.8–5.6)

## 2020-11-15 ENCOUNTER — Other Ambulatory Visit: Payer: Self-pay | Admitting: Internal Medicine

## 2020-11-15 DIAGNOSIS — M170 Bilateral primary osteoarthritis of knee: Secondary | ICD-10-CM

## 2020-11-15 DIAGNOSIS — M5116 Intervertebral disc disorders with radiculopathy, lumbar region: Secondary | ICD-10-CM

## 2020-11-15 DIAGNOSIS — I10 Essential (primary) hypertension: Secondary | ICD-10-CM

## 2020-11-17 ENCOUNTER — Telehealth: Payer: Self-pay | Admitting: Internal Medicine

## 2020-11-17 ENCOUNTER — Other Ambulatory Visit: Payer: Self-pay

## 2020-11-17 DIAGNOSIS — D649 Anemia, unspecified: Secondary | ICD-10-CM

## 2020-11-17 NOTE — Telephone Encounter (Signed)
Will call patient now. See Attached lab note in results.

## 2020-11-17 NOTE — Telephone Encounter (Signed)
Labs returned with worsening anemia.  She needs to return here for iron studies and she needs to see GI ASAP.

## 2020-11-18 ENCOUNTER — Other Ambulatory Visit
Admission: RE | Admit: 2020-11-18 | Discharge: 2020-11-18 | Disposition: A | Discharge status: Home / Self Care | Payer: Medicare HMO | Attending: Internal Medicine | Admitting: Internal Medicine

## 2020-11-18 ENCOUNTER — Other Ambulatory Visit: Payer: Self-pay

## 2020-11-18 DIAGNOSIS — D649 Anemia, unspecified: Secondary | ICD-10-CM | POA: Diagnosis not present

## 2020-11-18 LAB — IRON AND TIBC
Iron: 131 ug/dL (ref 28–170)
Saturation Ratios: 37 % — ABNORMAL HIGH (ref 10.4–31.8)
TIBC: 350 ug/dL (ref 250–450)
UIBC: 219 ug/dL

## 2020-11-18 LAB — FERRITIN: Ferritin: 318 ng/mL — ABNORMAL HIGH (ref 11–307)

## 2020-12-07 IMAGING — CR CHEST - 2 VIEW
2 series · 2 of 2 positions shown · non-contrast
Comparison: None.

CLINICAL DATA: Smoker, no known injury, rash suggestive of
dermatomyositis

EXAM:
CHEST - 2 VIEW

[chest pa]
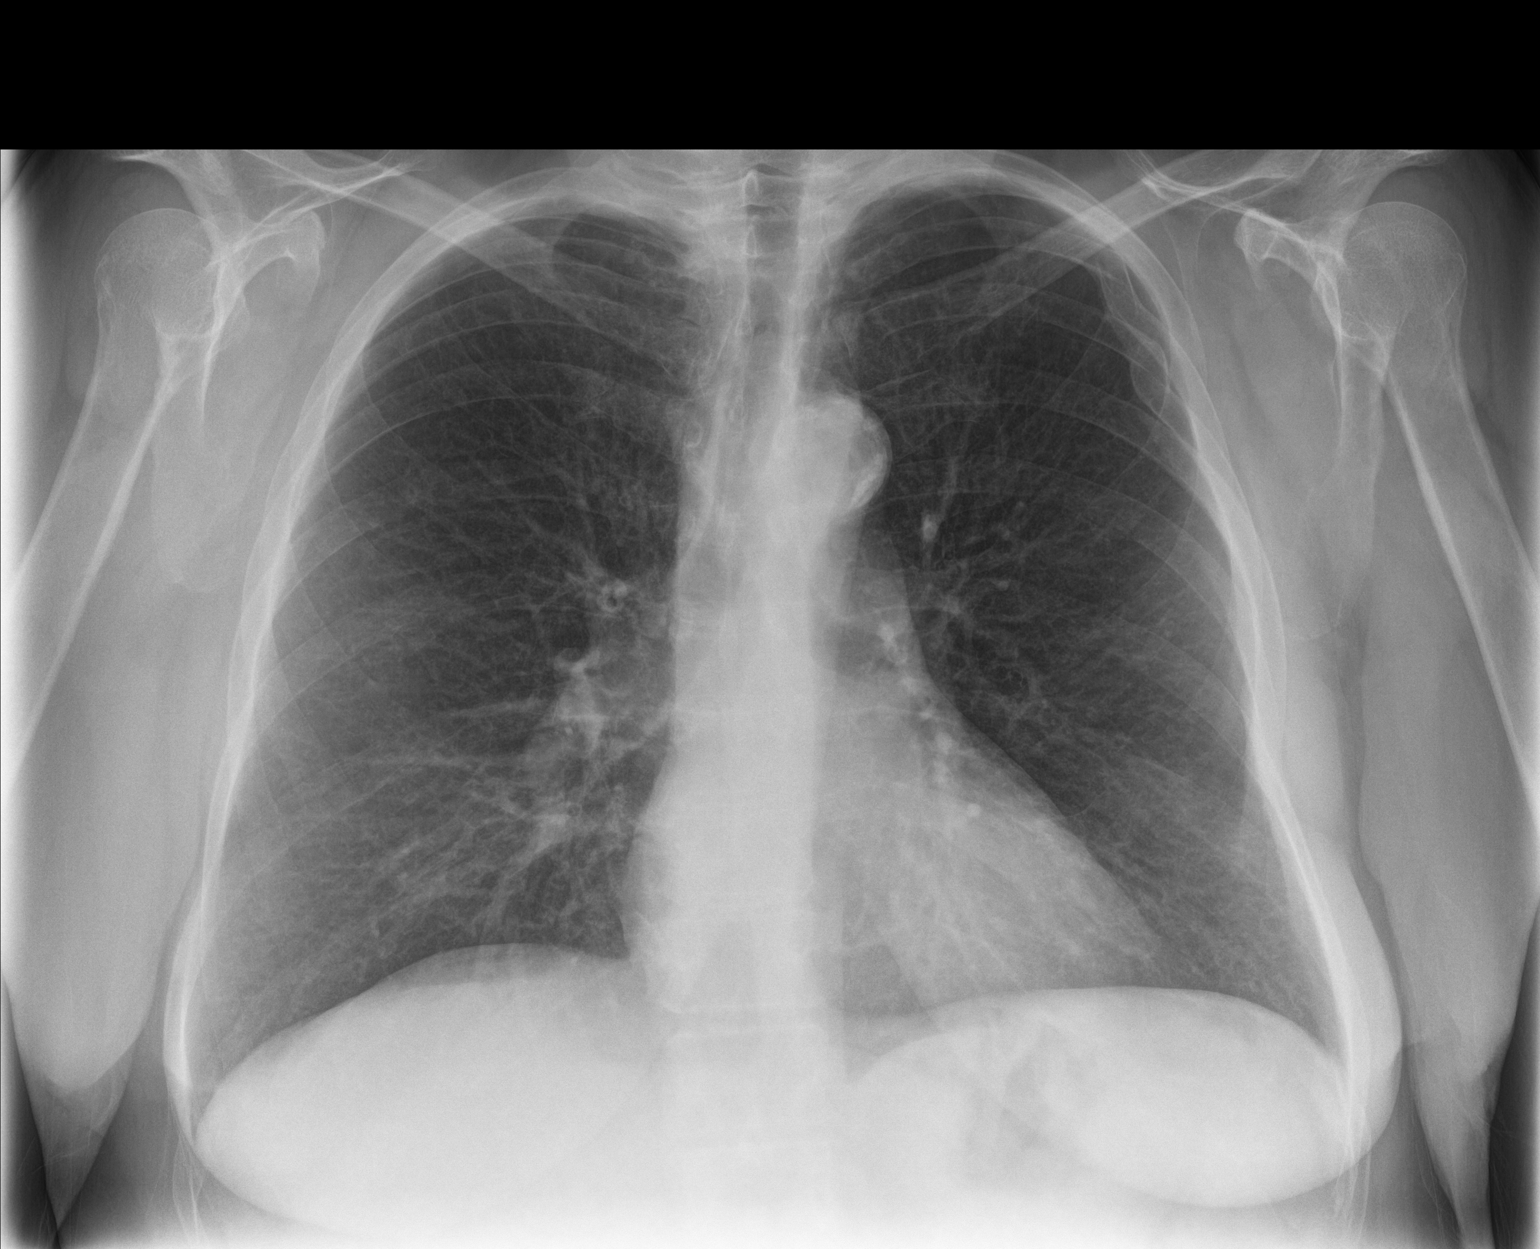

[chest lat]
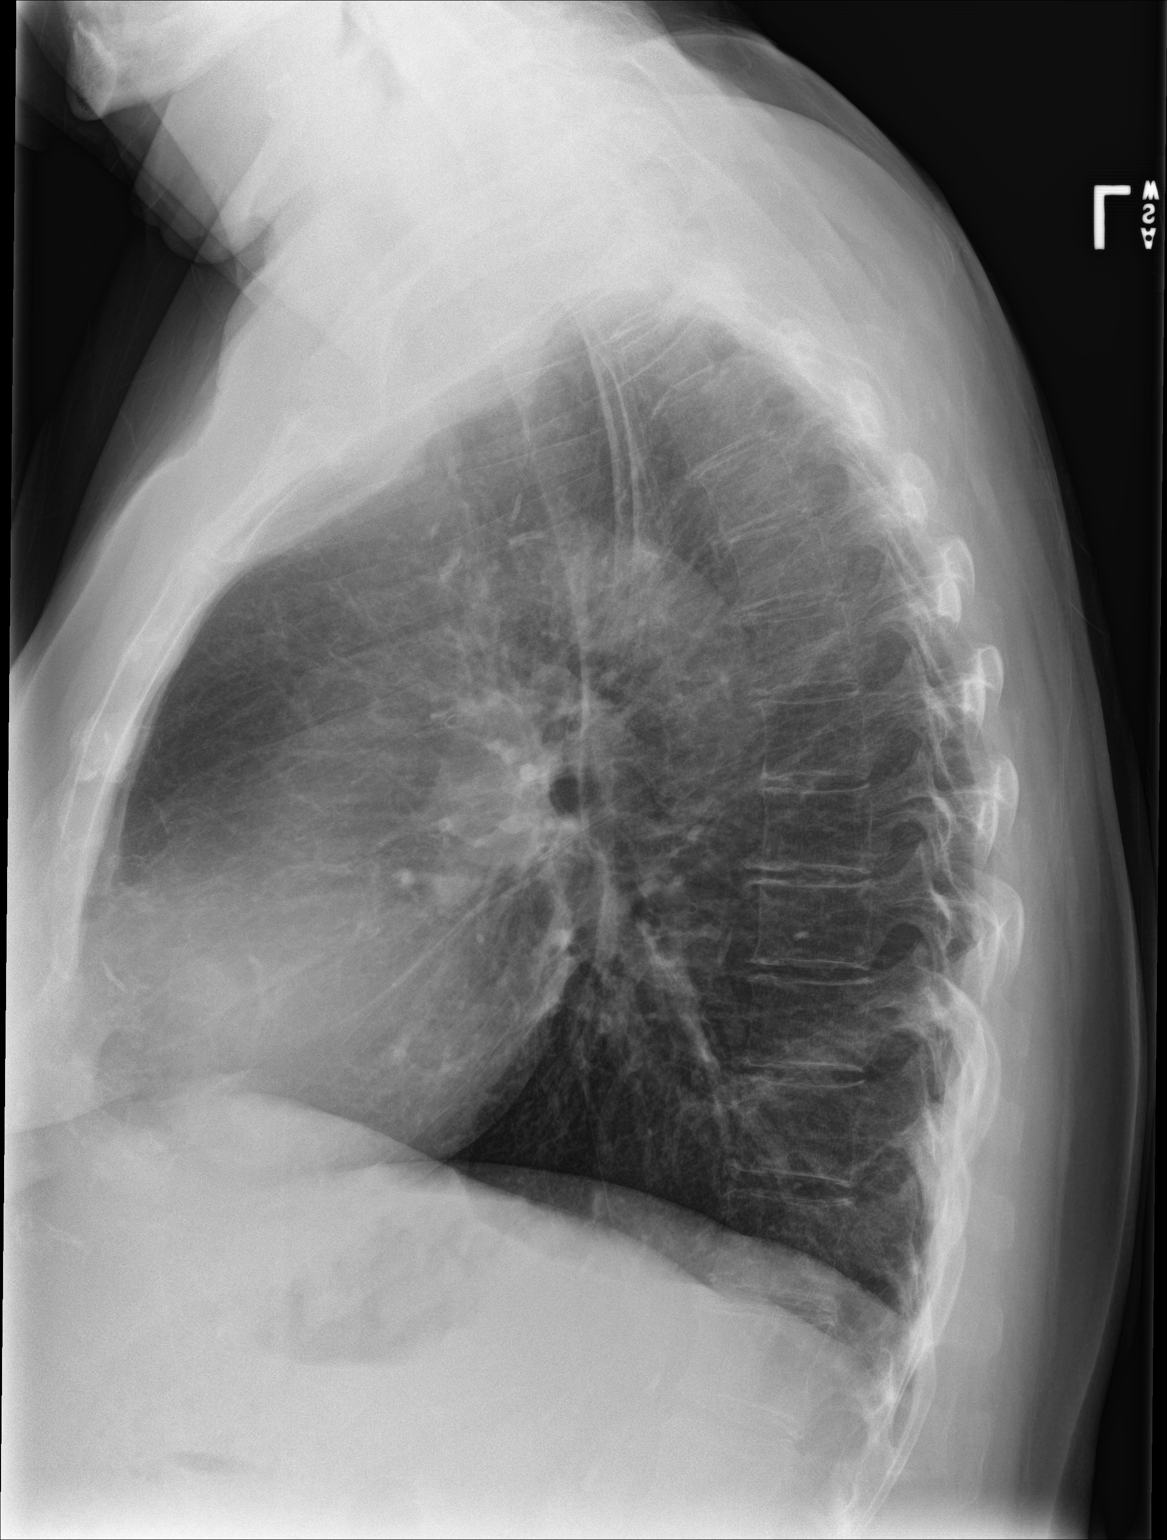

[2 of 2 positions shown; findings below may reference images not displayed]

FINDINGS: The heart size and mediastinal contours are within normal limits.
Both lungs are clear. The visualized skeletal structures are
unremarkable.
IMPRESSION: No active cardiopulmonary disease.

## 2020-12-28 ENCOUNTER — Ambulatory Visit: Payer: Medicare HMO | Admitting: Gastroenterology

## 2020-12-28 ENCOUNTER — Other Ambulatory Visit: Payer: Self-pay

## 2020-12-28 ENCOUNTER — Encounter: Payer: Self-pay | Admitting: Gastroenterology

## 2020-12-28 VITALS — BP 159/77 | HR 75 | Temp 98.2°F | Wt 176.0 lb

## 2020-12-28 DIAGNOSIS — D649 Anemia, unspecified: Secondary | ICD-10-CM | POA: Diagnosis not present

## 2020-12-28 DIAGNOSIS — K529 Noninfective gastroenteritis and colitis, unspecified: Secondary | ICD-10-CM

## 2020-12-28 DIAGNOSIS — R1319 Other dysphagia: Secondary | ICD-10-CM

## 2020-12-28 MED ORDER — NA SULFATE-K SULFATE-MG SULF 17.5-3.13-1.6 GM/177ML PO SOLN
ORAL | 0 refills | Status: DC
Start: 2020-12-28 — End: 2021-03-09

## 2020-12-28 NOTE — Progress Notes (Signed)
Kiara Campbell [address]  [place]  [place], [place] [address]  Main: [phone]  Fax: [phone]   Gastroenterology Consultation  Referring Provider:     Glean Hess, MD Primary Care Physician:  Glean Hess, MD Reason for Consultation:     Anemia        HPI:    Chief complaint: I was told I was anemic  Kiara Campbell is a 65 y.o. y/o female referred for consultation & management  by Dr. Army Melia, Jesse Sans, MD. patient was referred to [place] for further evaluation of anemia.  Patient denies any melena, but does see bright red blood per rectum intermittently on toilet paper only.  Reports 1 soft bowel movement every day or every other day.  No nausea or vomiting or weight loss.  Does report dysphagia to solid foods for 1 to 2 months, with no episodes of food impaction.  She states when symptoms occur she drinks water and it eventually passes.  Feels like food is getting hung in her throat.  Patient had an episode of diverticulitis in 2018 and was recommended to have a colonoscopy then but did not follow-up.  She does states she was admitted with COVID between then and now at some point.  Previous records reviewed and patient had an ED visit in February 2022 to [facility] and found to be C. difficile positive and treated with vancomycin.  CT scan at that time showed terminal ileitis thought to be secondary to COVID infection at that time.  Past Medical History:  Diagnosis Date   Calcium blood increased 12/23/2013   Diabetes mellitus without complication ([place])    Hyperlipidemia    Hypertension    Osteopenia     Past Surgical History:  Procedure Laterality Date   ABDOMINAL HYSTERECTOMY     partial   APPENDECTOMY     BREAST CYST ASPIRATION     neg not sure which side   RECTAL PROLAPSE REPAIR     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   TONSILLECTOMY     VARICOSE VEIN SURGERY      Prior to Admission medications   Medication Sig Start Date End Date Taking?  Authorizing Provider  Alcohol Swabs (B-D SINGLE USE SWABS REGULAR) PADS USE DAILY. 03/19/20  Yes Glean Hess, MD  allopurinol (ZYLOPRIM) 300 MG tablet TAKE 1 TABLET EVERY DAY 10/27/20  Yes Glean Hess, MD  azelastine (ASTELIN) 0.1 % nasal spray Place 1 spray into both nostrils 2 (two) times daily. Use in each nostril as directed 11/21/18  Yes Glean Hess, MD  Calcium Carb-Ergocalciferol 500-200 MG-UNIT TABS Take by mouth.   Yes [provider]  celecoxib (CELEBREX) 200 MG capsule TAKE 1 CAPSULE TWICE DAILY 11/15/20  Yes Glean Hess, MD  colchicine (COLCRYS) 0.6 MG tablet Take 1 tablet (0.6 mg total) by mouth 2 (two) times daily. 08/21/19  Yes Glean Hess, MD  DULoxetine (CYMBALTA) 60 MG capsule Take 1 capsule (60 mg total) by mouth daily. 04/07/20  Yes Glean Hess, MD  famotidine (PEPCID) 20 MG tablet Take 1 tablet by mouth daily. 08/13/20  Yes [provider]  fexofenadine (ALLEGRA) 60 MG tablet Take 60 mg by mouth 2 (two) times daily.   Yes [provider]  fluticasone [employer]encion [facility]) 50 MCG/ACT nasal spray  02/12/15  Yes [provider]  gabapentin (NEURONTIN) 100 MG capsule TAKE 2 CAPSULES TWICE DAILY 11/15/20  Yes Glean Hess, MD  Lancet Devices (  ACCU-CHEK SOFTCLIX) lancets 1 each by Other route daily. Use as instructed 03/12/15  Yes Glean Hess, MD  Lancets Misc. (ACCU-CHEK SOFTCLIX LANCET DEV) KIT 1 each by Does not apply route daily. 09/21/15  Yes Glean Hess, MD  losartan-hydrochlorothiazide [facility]) 100-25 MG tablet TAKE 1 TABLET EVERY DAY 11/15/20  Yes Glean Hess, MD  metFORMIN (GLUCOPHAGE-XR) 500 MG 24 hr tablet TAKE 1 TABLET (500 MG TOTAL) BY MOUTH DAILY WITH BREAKFAST. 10/27/20  Yes Glean Hess, MD  metoprolol succinate (TOPROL-XL) 50 MG 24 hr tablet TAKE 3 TABLETS EVERY DAY WITH OR IMMEDIATELY FOLLOWING A MEAL 11/15/20  Yes Glean Hess, MD  mupirocin ointment (BACTROBAN) 2 % PLACE 1 APPLICATION  INTO THE NOSE 2 TIMES A DAILY. 07/15/20  Yes Glean Hess, MD  psyllium (METAMUCIL SMOOTH TEXTURE) 28 % packet Take 1 packet by mouth 2 (two) times daily.   Yes [provider]  simvastatin (ZOCOR) 40 MG tablet TAKE 1 TABLET AT BEDTIME 09/22/20  Yes Glean Hess, MD  traMADol (ULTRAM) 50 MG tablet Take 1 tablet (50 mg total) by mouth every 6 (six) hours as needed. 11/11/20  Yes Glean Hess, MD  vitamin C (ASCORBIC ACID) 500 MG tablet Take 500 mg by mouth daily.   Yes [provider]  Na Sulfate-K Sulfate-Mg Sulf 17.5-3.13-1.6 GM/177ML SOLN Take as instructed 12/28/20   Virgel Manifold, MD  omeprazole (PRILOSEC) 20 MG capsule TAKE 1 CAPSULE TWICE DAILY BEFORE A MEAL Patient not taking: Reported on 12/28/2020 03/18/20   Glean Hess, MD    Family History  Problem Relation Age of Onset   CAD Father        died age 66   COPD Mother    Diabetes Maternal Grandmother    Breast cancer Neg Hx      Social History   Tobacco Use   Smoking status: Former    Packs/day: 0.50    Years: 30.00    Pack years: 15.00    Types: Cigarettes    Quit date: 08/04/2018    Years since quitting: 2.4   Smokeless tobacco: Never  Vaping Use   Vaping Use: Never used  Substance Use Topics   Alcohol use: Yes    Comment: occasional   Drug use: No    Allergies as of 12/28/2020 - Review Complete 12/28/2020  Allergen Reaction Noted   Glipizide Nausea Only 09/28/2015    Review of Systems:    All systems reviewed and negative except where noted in HPI.   Physical Exam:  Constitutional: General:   Alert,  Well-developed, well-nourished, pleasant and cooperative in NAD BP (!) 159/77   Pulse 75   Temp 98.2 F (36.8 C) (Oral)   Wt 176 lb (79.8 kg)   LMP 09/27/1995   BMI 28.41 kg/m   Eyes:  Sclera clear, no icterus.   Conjunctiva pink. PERRLA  Ears:  No scars, lesions or masses, Normal auditory acuity. Nose:  No deformity, discharge, or lesions. Mouth:  No deformity  or lesions, oropharynx pink & moist.  Neck:  Supple; no masses or thyromegaly.  Respiratory: Normal respiratory effort, Normal percussion  Gastrointestinal:  Normal bowel sounds.  No bruits.  Soft, non-tender and non-distended without masses, hepatosplenomegaly or hernias noted.  No guarding or rebound tenderness.     Cardiac: No clubbing or edema.  No cyanosis. Normal posterior tibial pedal pulses noted.  Lymphatic:  No significant cervical or axillary adenopathy.  Psych:  Alert and cooperative.  Normal mood and affect.  Musculoskeletal:  Normal gait. Head normocephalic, atraumatic. Symmetrical without gross deformities. 5/5 Upper and Lower extremity strength bilaterally.  Skin: Warm. Intact without significant lesions or rashes. No jaundice.  Neurologic:  Face symmetrical, tongue midline, Normal sensation to touch;  grossly normal neurologically.  Psych:  Alert and oriented x3, Alert and cooperative. Normal mood and affect.   Labs: CBC    Component Value Date/Time   WBC 8.0 11/11/2020 1611   WBC 9.8 02/16/2017 1652   RBC 2.49 (LL) 11/11/2020 1611   RBC 3.65 (L) 02/16/2017 1652   HGB 7.8 (L) 11/11/2020 1611   HCT 23.9 (L) 11/11/2020 1611   PLT 301 11/11/2020 1611   MCV 96 11/11/2020 1611   MCH 31.3 11/11/2020 1611   MCH 33.4 02/16/2017 1652   MCHC 32.6 11/11/2020 1611   MCHC 34.8 02/16/2017 1652   RDW 20.7 (H) 11/11/2020 1611   LYMPHSABS 2.8 11/11/2020 1611   MONOABS 1.8 (H) 02/16/2017 1652   EOSABS 0.5 (H) 11/11/2020 1611   BASOSABS 0.1 11/11/2020 1611   CMP     Component Value Date/Time   NA 141 04/07/2020 1356   K 4.8 04/07/2020 1356   CL 103 04/07/2020 1356   CO2 25 04/07/2020 1356   GLUCOSE 151 (H) 04/07/2020 1356   BUN 23 04/07/2020 1356   CREATININE 1.10 (H) 04/07/2020 1356   CALCIUM 10.3 04/07/2020 1356   PROT 7.3 04/07/2020 1356   ALBUMIN 4.7 04/07/2020 1356   AST 27 04/07/2020 1356   ALT 35 (H) 04/07/2020 1356   ALKPHOS 87 04/07/2020 1356    BILITOT 0.4 04/07/2020 1356   GFRNONAA 52 (L) 04/07/2020 1356   GFRAA 60 04/07/2020 1356    Imaging Studies: CT February 2022 with terminal wall thickening, terminal ileitis, fatty infiltration of cecum/ascending colon  Assessment and Plan:   Kiara Campbell is a 65 y.o. y/o female has been referred for anemia and also reporting dysphagia for the last 1 to 2 months, with previous history of diverticulitis in 2018  Patient has not had a screening colonoscopy in over 10 years and is due for screening colonoscopy at this time  In addition, given her previous history of diverticulitis in 2018, it is important to rule out malignancy in her colon  Previous CT results from February 2022 reviewed and we will try to intubate the terminal ileum during the colonoscopy to evaluate the terminal ileitis findings seen on CT scan at that time, which are likely due to C. difficile versus COVID infection at that time  EGD also indicated for further evaluation of dysphagia  I have discussed alternative options, risks & benefits,  which include, but are not limited to, bleeding, infection, perforation,respiratory complication & drug reaction.  The patient agrees with this plan & written consent will be obtained.     Dr Kiara Campbell  Speech recognition software was used to dictate the above note.

## 2020-12-28 NOTE — Addendum Note (Signed)
Addended by: Lurlean Nanny on: 12/28/2020 04:50 PM   Modules accepted: Orders, SmartSet

## 2021-01-11 ENCOUNTER — Telehealth: Payer: Self-pay | Admitting: Internal Medicine

## 2021-01-11 NOTE — Telephone Encounter (Signed)
Copied from [place] [id number]. Topic: Medicare AWV >> Jan 11, 2021 11:15 AM Cher Nakai R wrote: Reason for CRM:  Left message for patient to call back and schedule Medicare Annual Wellness Visit (AWV) in office.   If unable to come into the office for AWV,  please offer to do virtually or by telephone.  Last AWV: 05/22/2019  Please schedule at anytime with [facility] Health Advisor.  40 minute appointment  Any questions, please contact me at [phone]

## 2021-01-18 ENCOUNTER — Other Ambulatory Visit: Payer: Self-pay | Admitting: Internal Medicine

## 2021-01-18 DIAGNOSIS — E119 Type 2 diabetes mellitus without complications: Secondary | ICD-10-CM

## 2021-01-18 DIAGNOSIS — M10071 Idiopathic gout, right ankle and foot: Secondary | ICD-10-CM

## 2021-02-01 ENCOUNTER — Encounter: Payer: Self-pay | Admitting: Internal Medicine

## 2021-02-01 ENCOUNTER — Ambulatory Visit (INDEPENDENT_AMBULATORY_CARE_PROVIDER_SITE_OTHER): Payer: Medicare HMO | Admitting: Internal Medicine

## 2021-02-01 ENCOUNTER — Other Ambulatory Visit: Payer: Self-pay

## 2021-02-01 ENCOUNTER — Ambulatory Visit: Payer: Self-pay

## 2021-02-01 VITALS — BP 138/66 | HR 76 | Temp 98.3°F | Ht 66.0 in | Wt 171.0 lb

## 2021-02-01 DIAGNOSIS — L03114 Cellulitis of left upper limb: Secondary | ICD-10-CM

## 2021-02-01 MED ORDER — AMOXICILLIN-POT CLAVULANATE 875-125 MG PO TABS: 1.0000 | ORAL_TABLET | Freq: Two times a day (BID) | ORAL | 0 refills | Status: AC

## 2021-02-01 NOTE — Telephone Encounter (Signed)
Left hand began swelling on Friday night. Pain 7/10. History of gout.  Pt d/c'd Celebrex in preparation of colonoscopy on Weds. Made appointment with Dr. Army Melia for today.

## 2021-02-01 NOTE — Progress Notes (Signed)
Date:  02/01/2021   Name:  Kiara Campbell   DOB:  Nov 27, 1955   MRN:  [id number]   Chief Complaint: Gout (X4 days, Left hand, painful, feels hot, turns purple at night, swollen, redness. Hasn't taking iron, celebrex, gabapentin and iron for 4 days, due to colonoscopy )  Hand Pain  The incident occurred 3 to 5 days ago (skin lesion on finger opened up again). There was no injury mechanism. The pain is present in the right hand. The quality of the pain is described as burning and aching. The pain does not radiate. The pain is moderate. The pain has been Constant since the incident. Pertinent negatives include no chest pain, muscle weakness, numbness or tingling. The symptoms are aggravated by movement. She has tried ice for the symptoms.   Lab Results  Component Value Date   CREATININE 1.10 (H) 04/07/2020   BUN 23 04/07/2020   NA 141 04/07/2020   K 4.8 04/07/2020   CL 103 04/07/2020   CO2 25 04/07/2020   Lab Results  Component Value Date   CHOL 111 04/07/2020   HDL 44 04/07/2020   LDLCALC 39 04/07/2020   TRIG 171 (H) 04/07/2020   CHOLHDL 2.5 04/07/2020   Lab Results  Component Value Date   TSH 2.510 03/04/2019   Lab Results  Component Value Date   HGBA1C 6.1 (H) 11/11/2020   Lab Results  Component Value Date   WBC 8.0 11/11/2020   HGB 7.8 (L) 11/11/2020   HCT 23.9 (L) 11/11/2020   MCV 96 11/11/2020   PLT 301 11/11/2020   Lab Results  Component Value Date   ALT 35 (H) 04/07/2020   AST 27 04/07/2020   ALKPHOS 87 04/07/2020   BILITOT 0.4 04/07/2020    Review of Systems  Constitutional:  Negative for chills, fatigue and fever.  Respiratory:  Negative for chest tightness and shortness of breath.   Cardiovascular:  Negative for chest pain.  Musculoskeletal:  Positive for arthralgias and joint swelling.  Skin:  Positive for color change and wound.  Neurological:  Negative for tingling and numbness.   Patient Active Problem List   Diagnosis Date Noted   Hx of skin  cancer, basal cell 11/19/2018   Tobacco use disorder, severe, in early remission 08/09/2017   Obesity (BMI 30-39.9) 07/17/2017   GERD (gastroesophageal reflux disease) 07/12/2017   Primary osteoarthritis of both knees 08/01/2016   Type II diabetes mellitus with complication ([place]) 11/91/4782   Fibrocystic breast disease 12/03/2014   Colon polyp 12/03/2014   Diverticulitis of colon 12/03/2014   Essential (primary) hypertension 12/03/2014   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   OP (osteoporosis) 12/03/2014   Hyperlipidemia associated with type 2 diabetes mellitus ([place]) 12/03/2014    Allergies  Allergen Reactions   Glipizide Nausea Only    Past Surgical History:  Procedure Laterality Date   ABDOMINAL HYSTERECTOMY     partial   APPENDECTOMY     BREAST CYST ASPIRATION     neg not sure which side   RECTAL PROLAPSE REPAIR     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   TONSILLECTOMY     VARICOSE VEIN SURGERY      Social History   Tobacco Use   Smoking status: Former    Packs/day: 0.50    Years: 30.00    Pack years: 15.00    Types: Cigarettes    Quit date: 08/04/2018    Years since quitting: 2.4   Smokeless tobacco: Never  Vaping Use   Vaping Use: Never used  Substance Use Topics   Alcohol use: Yes    Comment: occasional   Drug use: No     Medication list has been reviewed and updated.  Current Meds  Medication Sig   Alcohol Swabs (B-D SINGLE USE SWABS REGULAR) PADS USE DAILY.   allopurinol (ZYLOPRIM) 300 MG tablet TAKE 1 TABLET EVERY DAY   amoxicillin-clavulanate (AUGMENTIN) 875-125 MG tablet Take 1 tablet by mouth 2 (two) times daily for 10 days.   azelastine (ASTELIN) 0.1 % nasal spray Place 1 spray into both nostrils 2 (two) times daily. Use in each nostril as directed   Calcium Carb-Ergocalciferol 500-200 MG-UNIT TABS Take by mouth.   colchicine (COLCRYS) 0.6 MG tablet Take 1 tablet (0.6 mg total) by mouth 2 (two) times daily.   famotidine  (PEPCID) 20 MG tablet Take 1 tablet by mouth daily.   fexofenadine (ALLEGRA) 60 MG tablet Take 60 mg by mouth 2 (two) times daily.   fluticasone (FLONASE) 50 MCG/ACT nasal spray    Lancet Devices (ACCU-CHEK SOFTCLIX) lancets 1 each by Other route daily. Use as instructed   Lancets Misc. (ACCU-CHEK SOFTCLIX LANCET DEV) KIT 1 each by Does not apply route daily.   losartan-hydrochlorothiazide (HYZAAR) 100-25 MG tablet TAKE 1 TABLET EVERY DAY   metFORMIN (GLUCOPHAGE-XR) 500 MG 24 hr tablet TAKE 1 TABLET (500 MG TOTAL) BY MOUTH DAILY WITH BREAKFAST.   metoprolol succinate (TOPROL-XL) 50 MG 24 hr tablet TAKE 3 TABLETS EVERY DAY WITH OR IMMEDIATELY FOLLOWING A MEAL   mupirocin ointment (BACTROBAN) 2 % PLACE 1 APPLICATION INTO THE NOSE 2 TIMES A DAILY.   Na Sulfate-K Sulfate-Mg Sulf 17.5-3.13-1.6 GM/177ML SOLN Take as instructed   omeprazole (PRILOSEC) 20 MG capsule TAKE 1 CAPSULE TWICE DAILY BEFORE A MEAL   psyllium (METAMUCIL SMOOTH TEXTURE) 28 % packet Take 1 packet by mouth 2 (two) times daily.   simvastatin (ZOCOR) 40 MG tablet TAKE 1 TABLET AT BEDTIME   traMADol (ULTRAM) 50 MG tablet Take 1 tablet (50 mg total) by mouth every 6 (six) hours as needed.   vitamin C (ASCORBIC ACID) 500 MG tablet Take 500 mg by mouth daily.    PHQ 2/9 Scores 02/01/2021 11/11/2020 07/15/2020 04/07/2020  PHQ - 2 Score 2 1 0 0  PHQ- 9 Score _0 -  Exception Documentation - - - -    GAD 7 : Generalized Anxiety Score 02/01/2021 11/11/2020 07/15/2020 04/07/2020  Nervous, Anxious, on Edge 0 2 0 0  Control/stop worrying 1 1 0 0  Worry too much - different things 1 1 0 0  Trouble relaxing 1 1 0 0  Restless 1 0 0 0  Easily annoyed or irritable 1 2 0 0  Afraid - awful might happen 0 1 0 0  Total GAD 7 Score 5 8 0 0  Anxiety Difficulty - Somewhat difficult - Not difficult at all    BP Readings from Last 3 Encounters:  02/01/21 138/66  12/28/20 (!) 159/77  11/11/20 132/78    Physical Exam Vitals and nursing note  reviewed.  Constitutional:      General: She is not in acute distress.    Appearance: She is well-developed.  HENT:     Head: Normocephalic and atraumatic.  Cardiovascular:     Rate and Rhythm: Normal rate and regular rhythm.  Pulmonary:     Effort: Pulmonary effort is normal. No respiratory distress.     Breath sounds: No wheezing or rhonchi.  Skin:    General: Skin is warm and dry.     Findings: Rash present. Rash is scaling (lesion on left index finger).     Comments: Redness, warmth, swelling of lateral dorsal left hand, index finger and base of thumb  Neurological:     Mental Status: She is alert and oriented to person, place, and time.  Psychiatric:        Mood and Affect: Mood normal.        Behavior: Behavior normal.    Wt Readings from Last 3 Encounters:  02/01/21 171 lb (77.6 kg)  12/28/20 176 lb (79.8 kg)  11/11/20 190 lb (86.2 kg)    BP 138/66   Pulse 76   Temp 98.3 F (36.8 C) (Oral)   Ht _0  (1.676 m)   Wt 171 lb (77.6 kg)   LMP 09/27/1995   SpO2 96%   BMI 27.60 kg/m   Assessment and Plan: 1. Cellulitis of left hand Local care, tylenol if needed (holding some meds due to colonoscopy in 2 days) Elevate hand to reduce swelling Call in three days if not improving - amoxicillin-clavulanate (AUGMENTIN) 875-125 MG tablet; Take 1 tablet by mouth 2 (two) times daily for 10 days.  Dispense: 20 tablet; Refill: 0   Partially dictated using Editor, commissioning. Any errors are unintentional.  Halina Maidens, MD [facility]  02/01/2021

## 2021-02-01 NOTE — Telephone Encounter (Signed)
Reason for Disposition  [1] MILD swelling (puffiness) of both hands AND [2] not better after 3 days  Answer Assessment - Initial Assessment Questions 1. ONSET: "When did the swelling start?" (e.g., minutes, hours, days)     Friday 2. LOCATION: "What part of the hand is swollen?"  "Are both hands swollen or just one hand?"     left 3. SEVERITY: "How bad is the swelling?" (e.g., localized; mild, moderate, severe)   - BALL OR LUMP: small ball or lump   - LOCALIZED: puffy or swollen area or patch of skin   - JOINT SWELLING: swelling of a joint   - MILD: puffiness or mild swelling of fingers or hand   - MODERATE: fingers and hand are swollen   - SEVERE: swelling of entire hand and up into forearm     moderate 4. REDNESS: "Does the swelling look red or infected?"     Red and hot 5. PAIN: "Is the swelling painful to touch?" If Yes, ask: "How painful is it?"   (Scale 1-10; mild, moderate or severe)     7 6. FEVER: "Do you have a fever?" If Yes, ask: "What is it, how was it measured, and when did it start?"      no 7. CAUSE: "What do you think is causing the hand swelling?" (e.g., heat, insect bite, pregnancy, recent injury)     gout 8. MEDICAL HISTORY: "Do you have a history of heart failure, kidney disease, liver failure, or cancer?"     gout 9. RECURRENT SYMPTOM: "Have you had hand swelling before?" If Yes, ask: "When was the last time?" "What happened that time?"     no 10. OTHER SYMPTOMS: "Do you have any other symptoms?" (e.g., blurred vision, difficulty breathing, headache)       no 11. PREGNANCY: "Is there any chance you are pregnant?" "When was your last menstrual period?"       no  Protocols used: Hand Swelling-A-AH

## 2021-02-03 ENCOUNTER — Ambulatory Visit: Payer: Medicare HMO | Admitting: Certified Registered Nurse Anesthetist

## 2021-02-03 ENCOUNTER — Encounter: Payer: Self-pay | Admitting: Gastroenterology

## 2021-02-03 ENCOUNTER — Encounter
Admission: RE | Disposition: A | Discharge status: Home / Self Care | Payer: Self-pay | Source: Home / Self Care | Attending: Gastroenterology

## 2021-02-03 ENCOUNTER — Ambulatory Visit
Admission: RE | Admit: 2021-02-03 | Discharge: 2021-02-03 | Disposition: A | Discharge status: Home / Self Care | Payer: Medicare HMO | Attending: Gastroenterology | Admitting: Gastroenterology

## 2021-02-03 DIAGNOSIS — D649 Anemia, unspecified: Secondary | ICD-10-CM

## 2021-02-03 DIAGNOSIS — K635 Polyp of colon: Secondary | ICD-10-CM | POA: Diagnosis not present

## 2021-02-03 DIAGNOSIS — Z888 Allergy status to other drugs, medicaments and biological substances status: Secondary | ICD-10-CM | POA: Diagnosis not present

## 2021-02-03 DIAGNOSIS — Z1211 Encounter for screening for malignant neoplasm of colon: Secondary | ICD-10-CM

## 2021-02-03 DIAGNOSIS — R1319 Other dysphagia: Secondary | ICD-10-CM | POA: Diagnosis not present

## 2021-02-03 DIAGNOSIS — K2950 Unspecified chronic gastritis without bleeding: Secondary | ICD-10-CM | POA: Insufficient documentation

## 2021-02-03 DIAGNOSIS — Z87891 Personal history of nicotine dependence: Secondary | ICD-10-CM | POA: Insufficient documentation

## 2021-02-03 DIAGNOSIS — Z96651 Presence of right artificial knee joint: Secondary | ICD-10-CM | POA: Insufficient documentation

## 2021-02-03 DIAGNOSIS — K3189 Other diseases of stomach and duodenum: Secondary | ICD-10-CM | POA: Insufficient documentation

## 2021-02-03 DIAGNOSIS — D124 Benign neoplasm of descending colon: Secondary | ICD-10-CM | POA: Diagnosis not present

## 2021-02-03 DIAGNOSIS — Z79899 Other long term (current) drug therapy: Secondary | ICD-10-CM | POA: Insufficient documentation

## 2021-02-03 DIAGNOSIS — K5790 Diverticulosis of intestine, part unspecified, without perforation or abscess without bleeding: Secondary | ICD-10-CM | POA: Diagnosis not present

## 2021-02-03 DIAGNOSIS — K319 Disease of stomach and duodenum, unspecified: Secondary | ICD-10-CM

## 2021-02-03 DIAGNOSIS — K449 Diaphragmatic hernia without obstruction or gangrene: Secondary | ICD-10-CM | POA: Diagnosis not present

## 2021-02-03 DIAGNOSIS — R1310 Dysphagia, unspecified: Secondary | ICD-10-CM | POA: Diagnosis not present

## 2021-02-03 DIAGNOSIS — Z7984 Long term (current) use of oral hypoglycemic drugs: Secondary | ICD-10-CM | POA: Diagnosis not present

## 2021-02-03 DIAGNOSIS — K5730 Diverticulosis of large intestine without perforation or abscess without bleeding: Secondary | ICD-10-CM | POA: Diagnosis not present

## 2021-02-03 DIAGNOSIS — K222 Esophageal obstruction: Secondary | ICD-10-CM | POA: Diagnosis not present

## 2021-02-03 DIAGNOSIS — L538 Other specified erythematous conditions: Secondary | ICD-10-CM | POA: Diagnosis not present

## 2021-02-03 HISTORY — PX: ESOPHAGOGASTRODUODENOSCOPY: SHX5428

## 2021-02-03 HISTORY — PX: COLONOSCOPY WITH PROPOFOL: SHX5780

## 2021-02-03 LAB — GLUCOSE, CAPILLARY: Glucose-Capillary: 133 mg/dL — ABNORMAL HIGH (ref 70–99)

## 2021-02-03 SURGERY — COLONOSCOPY WITH PROPOFOL
Anesthesia: General

## 2021-02-03 MED ORDER — LIDOCAINE HCL (CARDIAC) PF 100 MG/5ML IV SOSY
PREFILLED_SYRINGE | INTRAVENOUS | Status: DC | PRN
  Administered 2021-02-03: 100 mg via INTRAVENOUS

## 2021-02-03 MED ORDER — SODIUM CHLORIDE 0.9 % IV SOLN
INTRAVENOUS | Status: DC
  Administered 2021-02-03: 1000 mL via INTRAVENOUS

## 2021-02-03 MED ORDER — PROPOFOL 500 MG/50ML IV EMUL
INTRAVENOUS | Status: DC | PRN
  Administered 2021-02-03: 160 ug/kg/min via INTRAVENOUS

## 2021-02-03 MED ORDER — PROPOFOL 10 MG/ML IV BOLUS
INTRAVENOUS | Status: DC | PRN
  Administered 2021-02-03: 50 mg via INTRAVENOUS
  Administered 2021-02-03: 10 mg via INTRAVENOUS

## 2021-02-03 MED ORDER — EPHEDRINE SULFATE 50 MG/ML IJ SOLN
INTRAMUSCULAR | Status: DC | PRN
  Administered 2021-02-03: 10 mg via INTRAVENOUS

## 2021-02-03 NOTE — Transfer of Care (Signed)
Immediate Anesthesia Transfer of Care Note  Patient: Kiara Campbell  Procedure(s) Performed: COLONOSCOPY WITH PROPOFOL ESOPHAGOGASTRODUODENOSCOPY (EGD)  Patient Location: PACU  Anesthesia Type:General  Level of Consciousness: drowsy  Airway & Oxygen Therapy: Patient Spontanous Breathing and Patient connected to nasal cannula oxygen  Post-op Assessment: Report given to RN and Post -op Vital signs reviewed and stable  Post vital signs: Reviewed and stable  Last Vitals:  Vitals Value Taken Time  BP 101/53 02/03/21 1112  Temp    Pulse 68 02/03/21 1113  Resp 20 02/03/21 1113  SpO2 97 % 02/03/21 1113  Vitals shown include unvalidated device data.  Last Pain:  Vitals:   02/03/21 1112  TempSrc:   PainSc: 0-No pain         Complications: No notable events documented.

## 2021-02-03 NOTE — H&P (Signed)
Kiara Antigua, MD [address] [place], [place], [place], [address] [address], [place], [place], [place], [address] Phone: [phone]  Fax: [phone]  Primary Care Physician:  Glean Hess, MD   Pre-Procedure History & Physical: HPI:  Kiara Campbell is a 65 y.o. female is here for a colonoscopy and EGD.   Past Medical History:  Diagnosis Date   Calcium blood increased 12/23/2013   Diabetes mellitus without complication ([place])    Hyperlipidemia    Hypertension    Osteopenia     Past Surgical History:  Procedure Laterality Date   ABDOMINAL HYSTERECTOMY     partial   ABDOMINAL HYSTERECTOMY     APPENDECTOMY     BREAST CYST ASPIRATION     neg not sure which side   JOINT REPLACEMENT     RECTAL PROLAPSE REPAIR     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   TONSILLECTOMY     VARICOSE VEIN SURGERY      Prior to Admission medications   Medication Sig Start Date End Date Taking? Authorizing Provider  Alcohol Swabs (B-D SINGLE USE SWABS REGULAR) PADS USE DAILY. 03/19/20  Yes Glean Hess, MD  allopurinol (ZYLOPRIM) 300 MG tablet TAKE 1 TABLET EVERY DAY 01/18/21  Yes Glean Hess, MD  amoxicillin-clavulanate (AUGMENTIN) 875-125 MG tablet Take 1 tablet by mouth 2 (two) times daily for 10 days. 02/01/21 02/11/21 Yes Glean Hess, MD  Calcium Carb-Ergocalciferol 500-200 MG-UNIT TABS Take by mouth.   Yes [provider]  colchicine (COLCRYS) 0.6 MG tablet Take 1 tablet (0.6 mg total) by mouth 2 (two) times daily. 08/21/19  Yes Glean Hess, MD  famotidine (PEPCID) 20 MG tablet Take 1 tablet by mouth daily. 08/13/20  Yes [provider]  losartan-hydrochlorothiazide (HYZAAR) 100-25 MG tablet TAKE 1 TABLET EVERY DAY 11/15/20  Yes Glean Hess, MD  metFORMIN (GLUCOPHAGE-XR) 500 MG 24 hr tablet TAKE 1 TABLET (500 MG TOTAL) BY MOUTH DAILY WITH BREAKFAST. 01/18/21  Yes Glean Hess, MD  metoprolol succinate (TOPROL-XL) 50 MG 24 hr  tablet TAKE 3 TABLETS EVERY DAY WITH OR IMMEDIATELY FOLLOWING A MEAL 11/15/20  Yes Glean Hess, MD  simvastatin (ZOCOR) 40 MG tablet TAKE 1 TABLET AT BEDTIME 09/22/20  Yes Glean Hess, MD  traMADol (ULTRAM) 50 MG tablet Take 1 tablet (50 mg total) by mouth every 6 (six) hours as needed. 11/11/20  Yes Glean Hess, MD  vitamin C (ASCORBIC ACID) 500 MG tablet Take 500 mg by mouth daily.   Yes [provider]  azelastine (ASTELIN) 0.1 % nasal spray Place 1 spray into both nostrils 2 (two) times daily. Use in each nostril as directed 11/21/18   Glean Hess, MD  celecoxib (CELEBREX) 200 MG capsule TAKE 1 CAPSULE TWICE DAILY Patient not taking: Reported on 02/01/2021 11/15/20   Glean Hess, MD  Ferrous Sulfate (IRON PO) Take by mouth. Patient not taking: Reported on 02/01/2021    [provider]  fexofenadine (ALLEGRA) 60 MG tablet Take 60 mg by mouth 2 (two) times daily.    [provider]  fluticasone Asencion Islam) 50 MCG/ACT nasal spray  02/12/15   [provider]  gabapentin (NEURONTIN) 100 MG capsule TAKE 2 CAPSULES TWICE DAILY Patient not taking: Reported on 02/01/2021 11/15/20   Glean Hess, MD  Lancet Devices [facility]) lancets 1 each by Other route daily. Use as instructed 03/12/15   Glean Hess, MD  Lancets Misc. (ACCU-CHEK  SOFTCLIX LANCET DEV) KIT 1 each by Does not apply route daily. 09/21/15   Glean Hess, MD  mupirocin ointment (BACTROBAN) 2 % PLACE 1 APPLICATION INTO THE NOSE 2 TIMES A DAILY. 07/15/20   Glean Hess, MD  Na Sulfate-K Sulfate-Mg Sulf 17.5-3.13-1.6 GM/177ML SOLN Take as instructed 12/28/20   Virgel Manifold, MD  omeprazole (PRILOSEC) 20 MG capsule TAKE 1 CAPSULE TWICE DAILY BEFORE A MEAL Patient not taking: Reported on 02/03/2021 03/18/20   Glean Hess, MD  psyllium (METAMUCIL SMOOTH TEXTURE) 28 % packet Take 1 packet by mouth 2 (two) times daily.    [provider]    Allergies  as of 12/29/2020 - Review Complete 12/28/2020  Allergen Reaction Noted   Glipizide Nausea Only 09/28/2015    Family History  Problem Relation Age of Onset   CAD Father        died age 68   COPD Mother    Diabetes Maternal Grandmother    Breast cancer Neg Hx     Social History   Socioeconomic History   Marital status: Married    Spouse name: Not on file   Number of children: 2   Years of education: Not on file   Highest education level: Bachelor's degree (e.g., BA, AB, BS)  Occupational History   Occupation: Retired  Tobacco Use   Smoking status: Former    Packs/day: 0.50    Years: 30.00    Pack years: 15.00    Types: Cigarettes    Quit date: 08/04/2018    Years since quitting: 2.5   Smokeless tobacco: Never  Vaping Use   Vaping Use: Never used  Substance and Sexual Activity   Alcohol use: Yes    Comment: occasional   Drug use: No   Sexual activity: Not Currently  Other Topics Concern   Not on file  Social History Narrative   Not on file   Social Determinants of Health   Financial Resource Strain: Not on file  Food Insecurity: Not on file  Transportation Needs: Not on file  Physical Activity: Not on file  Stress: Not on file  Social Connections: Not on file  Intimate Partner Violence: Not on file    Review of Systems: See HPI, otherwise negative ROS  Constitutional: General:   Alert,  Well-developed, well-nourished, pleasant and cooperative in NAD BP 131/64   Pulse 78   Temp (!) 97.1 F (36.2 C) (Temporal)   Resp 16   Ht _0  (1.676 m)   Wt 77.2 kg   LMP 09/27/1995   SpO2 100%   BMI 27.46 kg/m   Head: Normocephalic, atraumatic.   Eyes:  Sclera clear, no icterus.   Conjunctiva pink.   Mouth:  No deformity or lesions, oropharynx pink & moist.  Neck:  Supple, trachea midline  Respiratory: Normal respiratory effort  Gastrointestinal:  Soft, non-tender and non-distended without masses, hepatosplenomegaly or hernias noted.  No guarding or  rebound tenderness.     Cardiac: No clubbing or edema.  No cyanosis. Normal posterior tibial pedal pulses noted.  Lymphatic:  No significant cervical adenopathy.  Psych:  Alert and cooperative. Normal mood and affect.  Musculoskeletal:   Symmetrical without gross deformities. 5/5 Lower extremity strength bilaterally.  Skin: Warm. Intact without significant lesions or rashes. No jaundice.  Neurologic:  Face symmetrical, tongue midline, Normal sensation to touch;  grossly normal neurologically.  Psych:  Alert and oriented x3, Alert and cooperative. Normal mood and affect.  Impression/Plan: Pamelia Hoit  Crew is here for a colonoscopy to be performed for average risk screening and EGD for dysphagia  Risks, benefits, limitations, and alternatives regarding the procedures have been reviewed with the patient.  Questions have been answered.  All parties agreeable.   Virgel Manifold, MD  02/03/2021, 9:41 AM

## 2021-02-03 NOTE — Anesthesia Preprocedure Evaluation (Signed)
Anesthesia Evaluation  Patient identified by MRN, date of birth, ID band Patient awake    Reviewed: Allergy & Precautions, H&P , NPO status , Patient's Chart, lab work & pertinent test results, reviewed documented beta blocker date and time   Airway Mallampati: II   Neck ROM: full    Dental  (+) Poor Dentition   Pulmonary neg pulmonary ROS, former smoker,    Pulmonary exam normal        Cardiovascular Exercise Tolerance: Poor hypertension, On Medications negative cardio ROS Normal cardiovascular exam Rhythm:regular Rate:Normal     Neuro/Psych  Neuromuscular disease negative psych ROS   GI/Hepatic Neg liver ROS, GERD  Medicated,  Endo/Other  negative endocrine ROSdiabetes  Renal/GU negative Renal ROS  negative genitourinary   Musculoskeletal   Abdominal   Peds  Hematology negative hematology ROS (+)   Anesthesia Other Findings Past Medical History: 12/23/2013: Calcium blood increased No date: Diabetes mellitus without complication ([facility]) No date: Hyperlipidemia No date: Hypertension No date: Osteopenia Past Surgical History: No date: ABDOMINAL HYSTERECTOMY     Comment:  partial No date: ABDOMINAL HYSTERECTOMY No date: APPENDECTOMY No date: BREAST CYST ASPIRATION     Comment:  neg not sure which side No date: JOINT REPLACEMENT No date: RECTAL PROLAPSE REPAIR 07/25/2017: REPLACEMENT TOTAL KNEE; Right No date: TONSILLECTOMY No date: VARICOSE VEIN SURGERY BMI    Body Mass Index: 27.46 kg/m     Reproductive/Obstetrics negative OB ROS                             Anesthesia Physical Anesthesia Plan  ASA: 2  Anesthesia Plan: General   Post-op Pain Management:    Induction:   PONV Risk Score and Plan:   Airway Management Planned:   Additional Equipment:   Intra-op Plan:   Post-operative Plan:   Informed Consent: I have reviewed the patients History and Physical, chart,  labs and discussed the procedure including the risks, benefits and alternatives for the proposed anesthesia with the patient or authorized representative who has indicated his/her understanding and acceptance.     Dental Advisory Given  Plan Discussed with: CRNA  Anesthesia Plan Comments:         Anesthesia Quick Evaluation

## 2021-02-03 NOTE — Anesthesia Procedure Notes (Signed)
Date/Time: 02/03/2021 9:43 AM Performed by: Demetrius Charity, CRNA Pre-anesthesia Checklist: Patient identified, Emergency Drugs available, Suction available, Patient being monitored and Timeout performed Patient Re-evaluated:Patient Re-evaluated prior to induction Oxygen Delivery Method: Nasal cannula Induction Type: IV induction Placement Confirmation: CO2 detector and positive ETCO2

## 2021-02-03 NOTE — Op Note (Signed)
[facility] Gastroenterology Patient Name: Kiara Campbell Procedure Date: 02/03/2021 9:29 AM MRN: [id number] Account #: [id number] Date of Birth: [birth year] Admit Type: Outpatient Age: 65 Room: [facility] ENDO ROOM 3 Gender: Female Note Status: Finalized Procedure:             Colonoscopy Indications:           Screening for colorectal malignant neoplasm Providers:             Neyla Gauntt B. Bonna Gains MD, MD Medicines:             Monitored Anesthesia Care Complications:         No immediate complications. Procedure:             Pre-Anesthesia Assessment:                        - ASA Grade Assessment: II - A patient with mild                         systemic disease.                        - Prior to the procedure, a History and Physical was                         performed, and patient medications, allergies and                         sensitivities were reviewed. The patient's tolerance                         of previous anesthesia was reviewed.                        - The risks and benefits of the procedure and the                         sedation options and risks were discussed with the                         patient. All questions were answered and informed                         consent was obtained.                        - Patient identification and proposed procedure were                         verified prior to the procedure by the physician, the                         nurse, the anesthesiologist, the anesthetist and the                         technician. The procedure was verified in the                         procedure room.  After obtaining informed consent, the colonoscope was                         passed under direct vision. Throughout the procedure,                         the patient's blood pressure, pulse, and oxygen                         saturations were monitored continuously. The                         Colonoscope was  introduced through the anus and                         advanced to the the terminal ileum. The colonoscopy                         was performed with ease. The patient tolerated the                         procedure well. The quality of the bowel preparation                         was good. Findings:      The perianal and digital rectal examinations were normal.      A 5 mm polyp was found in the ascending colon. The polyp was sessile.       The polyp was removed with a cold snare. Resection and retrieval were       complete.      A 4 mm polyp was found in the ileocecal valve. The polyp was flat. The       polyp was removed with a cold biopsy forceps. Resection and retrieval       were complete.      A 4 mm polyp was found in the descending colon. The polyp was flat. The       polyp was removed with a cold biopsy forceps. Resection and retrieval       were complete.      A 5 mm polyp was found in the sigmoid colon. The polyp was sessile. The       polyp was removed with a cold snare. Resection and retrieval were       complete.      Multiple diverticula were found in the entire colon.      The colon (entire examined portion) was tortuous. Advancing the scope       required straightening and shortening the scope to obtain bowel loop       reduction and applying abdominal pressure.      The exam was otherwise without abnormality.      The rectum, sigmoid colon, descending colon, transverse colon, ascending       colon and cecum appeared normal.      A 4 mm, non-bleeding polyp was found in the anus. The polyp was sessile.       This polyp was seen below the anal verge and was not biopsied or removed.      Retroflexion in the rectum was not performed due to Narrow rectum.       Careful frontal view of  the rectum was otherwise normal. Impression:            - One 5 mm polyp in the ascending colon, removed with                         a cold snare. Resected and retrieved.                         - One 4 mm polyp at the ileocecal valve, removed with                         a cold biopsy forceps. Resected and retrieved.                        - One 4 mm polyp in the descending colon, removed with                         a cold biopsy forceps. Resected and retrieved.                        - One 5 mm polyp in the sigmoid colon, removed with a                         cold snare. Resected and retrieved.                        - Diverticulosis in the entire examined colon.                        - Tortuous colon.                        - The examination was otherwise normal.                        - The rectum, sigmoid colon, descending colon,                         transverse colon, ascending colon and cecum are normal.                        - One 4 mm, non-bleeding polyp at the anus. Recommendation:        - Discharge patient to home (with escort).                        - Refer to [place] [facility] for polyp seen below anal                         verge. at appointment to be scheduled.                        - Advance diet as tolerated.                        - Continue present medications.                        - Await pathology results.                        -  Repeat colonoscopy date to be determined after                         pending pathology results are reviewed. Consider                         pediatric colonoscope on future colonoscopies.                        - The findings and recommendations were discussed with                         the patient.                        - The findings and recommendations were discussed with                         the patient's family.                        - Return to primary care physician as previously                         scheduled.                        - High fiber diet. Procedure Code(s):     --- Professional ---                        [phone], Colonoscopy, flexible; with removal of                          tumor(s), polyp(s), or other lesion(s) by snare                         technique                        [id number], 44, Colonoscopy, flexible; with biopsy, single                         or multiple Diagnosis Code(s):     --- Professional ---                        Z12.11, Encounter for screening for malignant neoplasm                         of colon                        K63.5, Polyp of colon CPT copyright 2019 American Medical Association. All rights reserved. The codes documented in this report are preliminary and upon coder review may  be revised to meet current compliance requirements.  [name], MD Margretta Sidle B. Bonna Gains MD, MD 02/03/2021 11:21:17 AM This report has been signed electronically. Number of Addenda: 0 Note Initiated On: 02/03/2021 9:29 AM Scope Withdrawal Time: 0 hours 34 minutes 39 seconds  Total Procedure Duration: 0 hours 56 minutes 5 seconds  Estimated Blood Loss:  Estimated blood loss: none.      [place]  [facility]

## 2021-02-03 NOTE — Op Note (Signed)
[facility] Gastroenterology Patient Name: Kiara Campbell Procedure Date: 02/03/2021 9:30 AM MRN: [id number] Account #: [id number] Date of Birth: January 13, 1956 Admit Type: Outpatient Age: 65 Room: [facility] ENDO ROOM 3 Gender: Female Note Status: Finalized Procedure:             Upper GI endoscopy Indications:           Dysphagia Providers:             Wallace Cogliano B. Bonna Gains MD, MD Medicines:             Monitored Anesthesia Care Complications:         No immediate complications. Procedure:             Pre-Anesthesia Assessment:                        - Prior to the procedure, a History and Physical was                         performed, and patient medications, allergies and                         sensitivities were reviewed. The patient's tolerance                         of previous anesthesia was reviewed.                        - The risks and benefits of the procedure and the                         sedation options and risks were discussed with the                         patient. All questions were answered and informed                         consent was obtained.                        - Patient identification and proposed procedure were                         verified prior to the procedure by the physician, the                         nurse, the anesthesiologist, the anesthetist and the                         technician. The procedure was verified in the                         procedure room.                        - ASA Grade Assessment: II - A patient with mild                         systemic disease.  After obtaining informed consent, the endoscope was                         passed under direct vision. Throughout the procedure,                         the patient's blood pressure, pulse, and oxygen                         saturations were monitored continuously. The Endoscope                         was introduced through the mouth,  and advanced to the                         second part of duodenum. The upper GI endoscopy was                         accomplished with ease. The patient tolerated the                         procedure well. Findings:      A widely patent and non-obstructing Schatzki ring was found in the       distal esophagus. Biopsies were obtained from the proximal and distal       esophagus with cold forceps for histology of suspected eosinophilic       esophagitis.      The exam of the esophagus was otherwise normal.      Patchy mildly erythematous mucosa without bleeding was found in the       gastric antrum. Biopsies were taken with a cold forceps for histology.       Biopsies were obtained in the gastric body, at the incisura and in the       gastric antrum with cold forceps for histology.      A single 5 mm mucosal papule (nodule) with no bleeding and no stigmata       of recent bleeding was found in the gastric antrum. Imaging was       performed using white light and narrow band imaging to visualize the       mucosa. Biopsies were taken with a cold forceps for histology.      A hiatal hernia was present.      The exam of the stomach was otherwise normal.      Patchy mildly erythematous mucosa without active bleeding and with no       stigmata of bleeding was found in the duodenal bulb.      The exam of the duodenum was otherwise normal.      The duodenal bulb, second portion of the duodenum and examined duodenum       were normal. Impression:            - Widely patent and non-obstructing Schatzki ring.                         Biopsied.                        - Erythematous mucosa in the antrum. Biopsied.                        -  A single mucosal papule (nodule) found in the                         stomach. Biopsied.                        - Hiatal hernia.                        - Erythematous duodenopathy.                        - Normal duodenal bulb, second portion of the duodenum                          and examined duodenum.                        - Biopsies were obtained in the gastric body, at the                         incisura and in the gastric antrum. Recommendation:        - Await pathology results.                        - Follow an antireflux regimen.                        - Discharge patient to home (with escort).                        - Advance diet as tolerated.                        - Continue present medications.                        - Patient has a contact number available for                         emergencies. The signs and symptoms of potential                         delayed complications were discussed with the patient.                         Return to normal activities tomorrow. Written                         discharge instructions were provided to the patient.                        - Discharge patient to home (with escort).                        - The findings and recommendations were discussed with                         the patient.                        - The  findings and recommendations were discussed with                         the patient's family. Procedure Code(s):     --- Professional ---                        [phone], Esophagogastroduodenoscopy, flexible,                         transoral; with biopsy, single or multiple Diagnosis Code(s):     --- Professional ---                        K22.2, Esophageal obstruction                        K31.89, Other diseases of stomach and duodenum                        K44.9, Diaphragmatic hernia without obstruction or                         gangrene                        R13.10, Dysphagia, unspecified CPT copyright 2019 American Medical Association. All rights reserved. The codes documented in this report are preliminary and upon coder review may  be revised to meet current compliance requirements.  [name], MD Margretta Sidle B. Bonna Gains MD, MD 02/03/2021 10:08:53 AM This  report has been signed electronically. Number of Addenda: 0 Note Initiated On: 02/03/2021 9:30 AM Estimated Blood Loss:  Estimated blood loss: none.      [facility]

## 2021-02-04 ENCOUNTER — Encounter: Payer: Self-pay | Admitting: Gastroenterology

## 2021-02-04 LAB — SURGICAL PATHOLOGY

## 2021-02-08 ENCOUNTER — Other Ambulatory Visit: Payer: Self-pay | Admitting: Internal Medicine

## 2021-02-08 DIAGNOSIS — M10071 Idiopathic gout, right ankle and foot: Secondary | ICD-10-CM

## 2021-02-08 NOTE — Anesthesia Postprocedure Evaluation (Signed)
Anesthesia Post Note  Patient: Kiara Campbell  Procedure(s) Performed: COLONOSCOPY WITH PROPOFOL ESOPHAGOGASTRODUODENOSCOPY (EGD)  Patient location during evaluation: PACU Anesthesia Type: General Level of consciousness: awake and alert Pain management: pain level controlled Vital Signs Assessment: post-procedure vital signs reviewed and stable Respiratory status: spontaneous breathing, nonlabored ventilation, respiratory function stable and patient connected to nasal cannula oxygen Cardiovascular status: blood pressure returned to baseline and stable Postop Assessment: no apparent nausea or vomiting Anesthetic complications: no   No notable events documented.   Last Vitals:  Vitals:   02/03/21 1130 02/03/21 1140  BP: (!) 120/59 117/63  Pulse: 71 72  Resp: (!) 23 19  Temp:    SpO2: 99% 97%    Last Pain:  Vitals:   02/03/21 1140  TempSrc:   PainSc: 0-No pain                 Molli Barrows

## 2021-02-11 ENCOUNTER — Encounter: Payer: Self-pay | Admitting: Gastroenterology

## 2021-02-16 ENCOUNTER — Telehealth: Payer: Self-pay

## 2021-02-16 NOTE — Telephone Encounter (Signed)
Referral form, procedure, progress notes, insurance card and demographics has been faxed

## 2021-03-08 ENCOUNTER — Telehealth: Payer: Self-pay | Admitting: Internal Medicine

## 2021-03-08 NOTE — Telephone Encounter (Signed)
Copied from [place] [id number]. Topic: Medicare AWV >> Mar 08, 2021  9:03 AM Cher Nakai R wrote: Reason for CRM:  Left message for patient to call back and schedule Medicare Annual Wellness Visit (AWV) in office.   If unable to come into the office for AWV,  please offer to do virtually or by telephone.  Last AWV: 05/22/2019  Please schedule at anytime with [facility] Advisor.  40 minute appointment  Any questions, please contact me at [phone]

## 2021-03-09 ENCOUNTER — Ambulatory Visit (INDEPENDENT_AMBULATORY_CARE_PROVIDER_SITE_OTHER): Payer: Medicare HMO | Admitting: Internal Medicine

## 2021-03-09 ENCOUNTER — Encounter: Payer: Self-pay | Admitting: Internal Medicine

## 2021-03-09 ENCOUNTER — Other Ambulatory Visit
Admission: RE | Admit: 2021-03-09 | Discharge: 2021-03-09 | Disposition: A | Discharge status: Home / Self Care | Payer: Medicare HMO | Attending: Internal Medicine | Admitting: Internal Medicine

## 2021-03-09 ENCOUNTER — Other Ambulatory Visit: Payer: Self-pay

## 2021-03-09 VITALS — BP 144/68 | HR 68 | Temp 97.8°F | Ht 66.0 in | Wt 177.0 lb

## 2021-03-09 DIAGNOSIS — I10 Essential (primary) hypertension: Secondary | ICD-10-CM | POA: Insufficient documentation

## 2021-03-09 DIAGNOSIS — E785 Hyperlipidemia, unspecified: Secondary | ICD-10-CM

## 2021-03-09 DIAGNOSIS — E1169 Type 2 diabetes mellitus with other specified complication: Secondary | ICD-10-CM | POA: Diagnosis not present

## 2021-03-09 DIAGNOSIS — D485 Neoplasm of uncertain behavior of skin: Secondary | ICD-10-CM

## 2021-03-09 DIAGNOSIS — Z23 Encounter for immunization: Secondary | ICD-10-CM | POA: Diagnosis not present

## 2021-03-09 DIAGNOSIS — Z0000 Encounter for general adult medical examination without abnormal findings: Secondary | ICD-10-CM

## 2021-03-09 DIAGNOSIS — E118 Type 2 diabetes mellitus with unspecified complications: Secondary | ICD-10-CM

## 2021-03-09 DIAGNOSIS — M170 Bilateral primary osteoarthritis of knee: Secondary | ICD-10-CM | POA: Diagnosis not present

## 2021-03-09 DIAGNOSIS — J3089 Other allergic rhinitis: Secondary | ICD-10-CM

## 2021-03-09 DIAGNOSIS — Z1231 Encounter for screening mammogram for malignant neoplasm of breast: Secondary | ICD-10-CM

## 2021-03-09 DIAGNOSIS — K219 Gastro-esophageal reflux disease without esophagitis: Secondary | ICD-10-CM

## 2021-03-09 LAB — CBC WITH DIFFERENTIAL/PLATELET
Abs Immature Granulocytes: 0.04 10*3/uL (ref 0.00–0.07)
Basophils Absolute: 0.1 10*3/uL (ref 0.0–0.1)
Basophils Relative: 1 %
Eosinophils Absolute: 0.3 10*3/uL (ref 0.0–0.5)
Eosinophils Relative: 3 %
HCT: 27.6 % — ABNORMAL LOW (ref 36.0–46.0)
Hemoglobin: 9.2 g/dL — ABNORMAL LOW (ref 12.0–15.0)
Immature Granulocytes: 1 %
Lymphocytes Relative: 36 %
Lymphs Abs: 3 10*3/uL (ref 0.7–4.0)
MCH: 32.4 pg (ref 26.0–34.0)
MCHC: 33.3 g/dL (ref 30.0–36.0)
MCV: 97.2 fL (ref 80.0–100.0)
Monocytes Absolute: 1.2 10*3/uL — ABNORMAL HIGH (ref 0.1–1.0)
Monocytes Relative: 15 %
Neutro Abs: 3.9 10*3/uL (ref 1.7–7.7)
Neutrophils Relative %: 44 %
Platelets: 332 10*3/uL (ref 150–400)
RBC: 2.84 MIL/uL — ABNORMAL LOW (ref 3.87–5.11)
RDW: 20.3 % — ABNORMAL HIGH (ref 11.5–15.5)
WBC: 8.6 10*3/uL (ref 4.0–10.5)
nRBC: 0.6 % — ABNORMAL HIGH (ref 0.0–0.2)

## 2021-03-09 LAB — COMPREHENSIVE METABOLIC PANEL
ALT: 22 U/L (ref 0–44)
AST: 22 U/L (ref 15–41)
Albumin: 4.5 g/dL (ref 3.5–5.0)
Alkaline Phosphatase: 63 U/L (ref 38–126)
Anion gap: 8 (ref 5–15)
BUN: 33 mg/dL — ABNORMAL HIGH (ref 8–23)
CO2: 29 mmol/L (ref 22–32)
Calcium: 10.4 mg/dL — ABNORMAL HIGH (ref 8.9–10.3)
Chloride: 101 mmol/L (ref 98–111)
Creatinine, Ser: 1.2 mg/dL — ABNORMAL HIGH (ref 0.44–1.00)
GFR, Estimated: 50 mL/min — ABNORMAL LOW (ref >60)
Glucose, Bld: 120 mg/dL — ABNORMAL HIGH (ref 70–99)
Potassium: 4.1 mmol/L (ref 3.5–5.1)
Sodium: 138 mmol/L (ref 135–145)
Total Bilirubin: 0.7 mg/dL (ref 0.3–1.2)
Total Protein: 8.1 g/dL (ref 6.5–8.1)

## 2021-03-09 LAB — HEMOGLOBIN A1C
Hgb A1c MFr Bld: 6.1 % — ABNORMAL HIGH (ref 4.8–5.6)
Mean Plasma Glucose: 128.37 mg/dL

## 2021-03-09 MED ORDER — AZELASTINE HCL 0.1 % NA SOLN: 1.0000 | Freq: Two times a day (BID) | NASAL | 1 refills | Status: AC

## 2021-03-09 MED ORDER — CELECOXIB 200 MG PO CAPS: 200.0000 mg | ORAL_CAPSULE | Freq: Two times a day (BID) | ORAL | 1 refills | Status: DC

## 2021-03-09 MED ORDER — METOPROLOL SUCCINATE ER 50 MG PO TB24: 150.0000 mg | ORAL_TABLET | Freq: Every day | ORAL | 1 refills | Status: DC

## 2021-03-09 MED ORDER — METFORMIN HCL ER 500 MG PO TB24: 500.0000 mg | ORAL_TABLET | Freq: Every day | ORAL | 1 refills | Status: DC

## 2021-03-09 MED ORDER — SIMVASTATIN 40 MG PO TABS: 40.0000 mg | ORAL_TABLET | Freq: Every day | ORAL | 1 refills | Status: DC

## 2021-03-09 MED ORDER — LOSARTAN POTASSIUM-HCTZ 100-25 MG PO TABS: 1.0000 | ORAL_TABLET | Freq: Every day | ORAL | 1 refills | Status: DC

## 2021-03-09 MED ORDER — FAMOTIDINE 20 MG PO TABS: 20.0000 mg | ORAL_TABLET | Freq: Every day | ORAL | 1 refills | Status: DC

## 2021-03-09 NOTE — Progress Notes (Signed)
Date:  03/09/2021   Name:  Kiara Campbell   DOB:  [birth year]   MRN:  [id number]   Chief Complaint: Annual Exam (Breast exam no pap ) and Flu Vaccine Kiara Campbell is a 65 y.o. female who presents today for her Complete Annual Exam. She feels well. She reports exercising walking when she can. She reports she is sleeping well. Breast complaints none.  Mammogram: 05/2019 abnormal left - bx negative DEXA: 05/2019 osteopenia Pap smear: discontinued Colonoscopy: 02/2021 7 polyps, one TA  Immunization History  Administered Date(s) Administered   Fluad Quad(high Dose 65+) 03/04/2019, 04/07/2020   Influenza,inj,Quad PF,6+ Mos 03/10/2015, 04/26/2016, 04/25/2017, 04/20/2018   Influenza-Unspecified 04/12/2017   Moderna Sars-Covid-2 Vaccination 08/14/2019, 09/11/2019, 06/03/2020   Pneumococcal Conjugate-13 01/28/2016   Pneumococcal Polysaccharide-23 05/12/1999, 07/05/2008, 11/19/2018   Tdap 07/06/2011   Zoster, Live 07/05/2012    Hypertension This is a chronic problem. The problem is controlled. Pertinent negatives include no chest pain, headaches, palpitations or shortness of breath. Past treatments include angiotensin blockers, beta blockers and diuretics. The current treatment provides significant improvement. There are no compliance problems.  Hypertensive end-organ damage includes kidney disease.  Diabetes She presents for her follow-up diabetic visit. She has type 2 diabetes mellitus. Pertinent negatives for hypoglycemia include no dizziness, headaches, nervousness/anxiousness or tremors. Pertinent negatives for diabetes include no chest pain, no fatigue, no polydipsia and no polyuria. Symptoms are stable. Current diabetic treatment includes oral agent (monotherapy). She is compliant with treatment all of the time. An ACE inhibitor/angiotensin II receptor blocker is being taken. Eye exam is not current.  Hyperlipidemia This is a chronic problem. The problem is controlled. Pertinent negatives  include no chest pain or shortness of breath. Current antihyperlipidemic treatment includes statins.  Anemia - noted on recent labs.  Colonoscopy showed no signs of bleeding.  EGD was normal in August.  Iron and Ferritin were normal in May.  Lab Results  Component Value Date   CREATININE 1.10 (H) 04/07/2020   BUN 23 04/07/2020   NA 141 04/07/2020   K 4.8 04/07/2020   CL 103 04/07/2020   CO2 25 04/07/2020   Lab Results  Component Value Date   CHOL 111 04/07/2020   HDL 44 04/07/2020   LDLCALC 39 04/07/2020   TRIG 171 (H) 04/07/2020   CHOLHDL 2.5 04/07/2020   Lab Results  Component Value Date   TSH 2.510 03/04/2019   Lab Results  Component Value Date   HGBA1C 6.1 (H) 11/11/2020   Lab Results  Component Value Date   WBC 8.0 11/11/2020   HGB 7.8 (L) 11/11/2020   HCT 23.9 (L) 11/11/2020   MCV 96 11/11/2020   PLT 301 11/11/2020   Lab Results  Component Value Date   ALT 35 (H) 04/07/2020   AST 27 04/07/2020   ALKPHOS 87 04/07/2020   BILITOT 0.4 04/07/2020     Review of Systems  Constitutional:  Negative for chills, fatigue and fever.  HENT:  Negative for congestion, hearing loss, tinnitus, trouble swallowing and voice change.   Eyes:  Negative for visual disturbance.  Respiratory:  Negative for cough, chest tightness, shortness of breath and wheezing.   Cardiovascular:  Negative for chest pain, palpitations and leg swelling.  Gastrointestinal:  Negative for abdominal pain, constipation, diarrhea and vomiting.  Endocrine: Negative for polydipsia and polyuria.  Genitourinary:  Negative for dysuria, frequency, genital sores, vaginal bleeding and vaginal discharge.  Musculoskeletal:  Positive for arthralgias, back pain and gait problem. Negative for  joint swelling.  Skin:  Negative for color change and rash.       New lesion  Neurological:  Negative for dizziness, tremors, light-headedness and headaches.  Hematological:  Negative for adenopathy. Does not bruise/bleed  easily.  Psychiatric/Behavioral:  Negative for dysphoric mood and sleep disturbance. The patient is not nervous/anxious.    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   Tobacco use disorder, severe, in early remission 08/09/2017   Obesity (BMI 30-39.9) 07/17/2017   GERD (gastroesophageal reflux disease) 07/12/2017   Primary osteoarthritis of both knees 08/01/2016   Type II diabetes mellitus with complication ([place]) 32/08/3341   Fibrocystic breast disease 12/03/2014   Colon polyp 12/03/2014   Diverticulitis of colon 12/03/2014   Essential (primary) hypertension 12/03/2014   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   OP (osteoporosis) 12/03/2014   Hyperlipidemia associated with type 2 diabetes mellitus ([place]) 12/03/2014    Allergies  Allergen Reactions   Glipizide Nausea Only    Past Surgical History:  Procedure Laterality Date   ABDOMINAL HYSTERECTOMY     partial   ABDOMINAL HYSTERECTOMY     APPENDECTOMY     BREAST CYST ASPIRATION     neg not sure which side   COLONOSCOPY WITH PROPOFOL N/A 02/03/2021   Procedure: COLONOSCOPY WITH PROPOFOL;  Surgeon: Virgel Manifold, MD;  Location: [facility] ENDOSCOPY;  Service: Endoscopy;  Laterality: N/A;   ESOPHAGOGASTRODUODENOSCOPY N/A 02/03/2021   Procedure: ESOPHAGOGASTRODUODENOSCOPY (EGD);  Surgeon: Virgel Manifold, MD;  Location: [facility] ENDOSCOPY;  Service: Endoscopy;  Laterality: N/A;   JOINT REPLACEMENT     RECTAL PROLAPSE REPAIR     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   TONSILLECTOMY     VARICOSE VEIN SURGERY      Social History   Tobacco Use   Smoking status: Former    Packs/day: 0.50    Years: 30.00    Pack years: 15.00    Types: Cigarettes    Quit date: 08/04/2018    Years since quitting: 2.5   Smokeless tobacco: Never   Vaping Use   Vaping Use: Never used  Substance Use Topics   Alcohol use: Yes    Comment: occasional   Drug use: No     Medication list has been reviewed and updated.  Current Meds  Medication Sig   Alcohol Swabs (B-D SINGLE USE SWABS REGULAR) PADS USE DAILY.   allopurinol (ZYLOPRIM) 300 MG tablet TAKE 1 TABLET EVERY DAY   azelastine (ASTELIN) 0.1 % nasal spray Place 1 spray into both nostrils 2 (two) times daily. Use in each nostril as directed   Calcium Carb-Ergocalciferol 500-200 MG-UNIT TABS Take by mouth.   celecoxib (CELEBREX) 200 MG capsule TAKE 1 CAPSULE TWICE DAILY   colchicine (COLCRYS) 0.6 MG tablet Take 1 tablet (0.6 mg total) by mouth 2 (two) times daily.   famotidine (PEPCID) 20 MG tablet Take 1 tablet by mouth daily.   Ferrous Sulfate (IRON PO) Take by mouth.   fexofenadine (ALLEGRA) 60 MG tablet Take 60 mg by mouth 2 (two) times daily.   FIBER PO Take by mouth in the morning and at bedtime.   fluticasone (FLONASE) 50 MCG/ACT nasal spray    gabapentin (NEURONTIN) 100 MG capsule TAKE 2 CAPSULES TWICE DAILY   Lancet Devices (  ACCU-CHEK SOFTCLIX) lancets 1 each by Other route daily. Use as instructed   Lancets Misc. (ACCU-CHEK SOFTCLIX LANCET DEV) KIT 1 each by Does not apply route daily.   losartan-hydrochlorothiazide (HYZAAR) 100-25 MG tablet TAKE 1 TABLET EVERY DAY   metFORMIN (GLUCOPHAGE-XR) 500 MG 24 hr tablet TAKE 1 TABLET (500 MG TOTAL) BY MOUTH DAILY WITH BREAKFAST.   metoprolol succinate (TOPROL-XL) 50 MG 24 hr tablet TAKE 3 TABLETS EVERY DAY WITH OR IMMEDIATELY FOLLOWING A MEAL   mupirocin ointment (BACTROBAN) 2 % PLACE 1 APPLICATION INTO THE NOSE 2 TIMES A DAILY.   simvastatin (ZOCOR) 40 MG tablet TAKE 1 TABLET AT BEDTIME   traMADol (ULTRAM) 50 MG tablet Take 1 tablet (50 mg total) by mouth every 6 (six) hours as needed.   vitamin C (ASCORBIC ACID) 500 MG tablet Take 500 mg by mouth daily.   [DISCONTINUED] omeprazole (PRILOSEC) 20 MG capsule TAKE 1 CAPSULE  TWICE DAILY BEFORE A MEAL   [DISCONTINUED] psyllium (METAMUCIL SMOOTH TEXTURE) 28 % packet Take 1 packet by mouth 2 (two) times daily.    PHQ 2/9 Scores 02/01/2021 11/11/2020 07/15/2020 04/07/2020  PHQ - 2 Score 2 1 0 0  PHQ- 9 Score 8 2 1  -  Exception Documentation - - - -    GAD 7 : Generalized Anxiety Score 02/01/2021 11/11/2020 07/15/2020 04/07/2020  Nervous, Anxious, on Edge 0 2 0 0  Control/stop worrying 1 1 0 0  Worry too much - different things 1 1 0 0  Trouble relaxing 1 1 0 0  Restless 1 0 0 0  Easily annoyed or irritable 1 2 0 0  Afraid - awful might happen 0 1 0 0  Total GAD 7 Score 5 8 0 0  Anxiety Difficulty - Somewhat difficult - Not difficult at all    BP Readings from Last 3 Encounters:  03/09/21 (!) 144/68  02/03/21 117/63  02/01/21 138/66    Physical Exam Vitals and nursing note reviewed.  Constitutional:      General: She is not in acute distress.    Appearance: She is well-developed. She is obese.  HENT:     Head: Normocephalic and atraumatic.     Right Ear: Tympanic membrane and ear canal normal.     Left Ear: Tympanic membrane and ear canal normal.     Nose:     Right Sinus: No maxillary sinus tenderness.     Left Sinus: No maxillary sinus tenderness.  Eyes:     General: No scleral icterus.       Right eye: No discharge.        Left eye: No discharge.     Conjunctiva/sclera: Conjunctivae normal.  Neck:     Thyroid: No thyromegaly.     Vascular: No carotid bruit.  Cardiovascular:     Rate and Rhythm: Normal rate and regular rhythm.     Pulses: Normal pulses.     Heart sounds: Normal heart sounds.  Pulmonary:     Effort: Pulmonary effort is normal. No respiratory distress.     Breath sounds: No wheezing.  Chest:  Breasts:    Right: No mass, nipple discharge, skin change or tenderness.     Left: No mass, nipple discharge, skin change or tenderness.  Abdominal:     General: Bowel sounds are normal.     Palpations: Abdomen is soft.     Tenderness:  There is no abdominal tenderness.  Musculoskeletal:     Cervical back: Normal range of motion. No  erythema.     Lumbar back: Decreased range of motion.     Right lower leg: No edema.     Left lower leg: No edema.  Lymphadenopathy:     Cervical: No cervical adenopathy.  Skin:    General: Skin is warm and dry.     Findings: No rash.  Neurological:     Mental Status: She is alert and oriented to person, place, and time.     Cranial Nerves: No cranial nerve deficit.     Sensory: No sensory deficit.     Deep Tendon Reflexes: Reflexes are normal and symmetric.  Psychiatric:        Attention and Perception: Attention normal.        Mood and Affect: Mood normal.    Wt Readings from Last 3 Encounters:  03/09/21 177 lb (80.3 kg)  02/03/21 170 lb 2.1 oz (77.2 kg)  02/01/21 171 lb (77.6 kg)    BP (!) 144/68   Pulse 68   Temp 97.8 F (36.6 C) (Oral)   Ht $R'5\' 6"'ZP$  (1.676 m)   Wt 177 lb (80.3 kg)   LMP 09/27/1995   SpO2 96%   BMI 28.57 kg/m   Assessment and Plan: 1. Annual physical exam Up to date on immunizations Colonoscopy and EGD recently completed - no cause for anemia found Flu vaccine today  2. Encounter for screening mammogram for breast cancer Pt to schedule at [facility] - MM 3D SCREEN BREAST BILATERAL  3. Essential (primary) hypertension BP is elevated today for unclear reasons. Continue current medications, monitor at home - CBC with Differential/Platelet - TSH - metoprolol succinate (TOPROL-XL) 50 MG 24 hr tablet; Take 3 tablets (150 mg total) by mouth daily. Take with or immediately following a meal.  Dispense: 270 tablet; Refill: 1 - losartan-hydrochlorothiazide (HYZAAR) 100-25 MG tablet; Take 1 tablet by mouth daily.  Dispense: 90 tablet; Refill: 1  4. Type II diabetes mellitus with complication (HCC) Clinically stable by exam and report without s/s of hypoglycemia. DM complicated by hypertension and dyslipidemia. Tolerating medications well without side effects or  other concerns. She is overdue for Eye exam - reminded to schedule at [facility] - Comprehensive metabolic panel - Hemoglobin A1c - metFORMIN (GLUCOPHAGE-XR) 500 MG 24 hr tablet; Take 1 tablet (500 mg total) by mouth daily with breakfast.  Dispense: 90 tablet; Refill: 1  5. Hyperlipidemia associated with type 2 diabetes mellitus ([place]) Tolerating statin medication without side effects at this time LDL is at goal of < 70 on current dose Continue same therapy without change at this time. - Lipid panel - simvastatin (ZOCOR) 40 MG tablet; Take 1 tablet (40 mg total) by mouth at bedtime.  Dispense: 90 tablet; Refill: 1  6. Neoplasm of uncertain behavior of skin of forearm - Ambulatory referral to Dermatology  7. Environmental and seasonal allergies - azelastine (ASTELIN) 0.1 % nasal spray; Place 1 spray into both nostrils 2 (two) times daily. Use in each nostril as directed  Dispense: 90 mL; Refill: 1  8. Primary osteoarthritis of both knees - celecoxib (CELEBREX) 200 MG capsule; Take 1 capsule (200 mg total) by mouth 2 (two) times daily.  Dispense: 180 capsule; Refill: 1  9. Gastroesophageal reflux disease without esophagitis Symptoms well controlled on daily pepcid. No red flag signs such as weight loss, n/v, melena Will continue pepcid. If anemia persists, will need Hematology referral - famotidine (PEPCID) 20 MG tablet; Take 1 tablet (20 mg total) by mouth daily.  Dispense:  90 tablet; Refill: 1   Partially dictated using Editor, commissioning. Any errors are unintentional.  Halina Maidens, MD [facility]  03/09/2021

## 2021-03-10 ENCOUNTER — Other Ambulatory Visit: Payer: Self-pay | Admitting: Internal Medicine

## 2021-03-10 DIAGNOSIS — M10071 Idiopathic gout, right ankle and foot: Secondary | ICD-10-CM

## 2021-03-10 DIAGNOSIS — E1169 Type 2 diabetes mellitus with other specified complication: Secondary | ICD-10-CM

## 2021-03-10 DIAGNOSIS — E118 Type 2 diabetes mellitus with unspecified complications: Secondary | ICD-10-CM

## 2021-03-11 ENCOUNTER — Other Ambulatory Visit: Payer: Self-pay

## 2021-03-11 MED ORDER — TRUE METRIX BLOOD GLUCOSE TEST VI STRP: ORAL_STRIP | 3 refills | Status: DC

## 2021-03-11 NOTE — Telephone Encounter (Signed)
Requested medication (s) are due for refill today: allopurinol  Requested medication (s) are on the active medication list: yes  Last refill:  01/18/21 #60 0 refills  Future visit scheduled: no , last seen 2 days ago   Notes to clinic:  zocor and metformin signed 03/09/21. How many refills do you want for allopurinol Rx ? Pharmacy requesting #90 ? Refills .     Requested Prescriptions  Pending Prescriptions Disp Refills   simvastatin (ZOCOR) 40 MG tablet [Pharmacy Med Name: SIMVASTATIN 40 MG Tablet] 90 tablet 1    Sig: TAKE 1 TABLET AT BEDTIME     Cardiovascular:  Antilipid - Statins Failed - 03/10/2021 12:05 PM      Failed - Triglycerides in normal range and within 360 days    Triglycerides  Date Value Ref Range Status  04/07/2020 171 (H) 0 - 149 mg/dL Final          Passed - Total Cholesterol in normal range and within 360 days    Cholesterol, Total  Date Value Ref Range Status  04/07/2020 111 100 - 199 mg/dL Final          Passed - LDL in normal range and within 360 days    LDL Chol Calc (NIH)  Date Value Ref Range Status  04/07/2020 39 0 - 99 mg/dL Final          Passed - HDL in normal range and within 360 days    HDL  Date Value Ref Range Status  04/07/2020 44 >39 mg/dL Final          Passed - Patient is not pregnant      Passed - Valid encounter within last 12 months    Recent Outpatient Visits           2 days ago Annual physical exam   [facility] Glean Hess, MD   1 month ago Cellulitis of left hand   [facility] Glean Hess, MD   4 months ago [facility] Glean Hess, MD   7 months ago Acute non-recurrent maxillary sinusitis   [facility] Glean Hess, MD   11 months ago Essential (primary) hypertension   [facility] Glean Hess, MD       Future Appointments             In 1 month Virgel Manifold, MD [facility] [place]             allopurinol  (ZYLOPRIM) 300 MG tablet [Pharmacy Med Name: ALLOPURINOL 300 MG Tablet] 90 tablet     Sig: TAKE 1 [place]     Endocrinology:  Gout Agents Failed - 03/10/2021 12:05 PM      Failed - Uric Acid in normal range and within 360 days    Uric Acid  Date Value Ref Range Status  02/28/2018 4.7 2.5 - 7.1 mg/dL Final    Comment:               Therapeutic target for gout patients: <6.0          Failed - Cr in normal range and within 360 days    Creatinine, Ser  Date Value Ref Range Status  03/09/2021 1.20 (H) 0.44 - 1.00 mg/dL Final          Passed - Valid encounter within last 12 months    Recent Outpatient Visits  2 days ago Annual physical exam   [facility] Glean Hess, MD   1 month ago Cellulitis of left hand   [facility] Glean Hess, MD   4 months ago [facility] Glean Hess, MD   7 months ago Acute non-recurrent maxillary sinusitis   [facility] Glean Hess, MD   11 months ago Essential (primary) hypertension   [facility] Glean Hess, MD       Future Appointments             In 1 month Bonna Gains, Lennette Bihari, MD [facility] [place]             metFORMIN (GLUCOPHAGE-XR) 500 MG 24 hr tablet [Pharmacy Med Name: METFORMIN HYDROCHLORIDE ER 500 MG Tablet Extended Release 24 Hour] 90 tablet 1    Sig: TAKE 1 [place]     Endocrinology:  Diabetes - Biguanides Failed - 03/10/2021 12:05 PM      Failed - Cr in normal range and within 360 days    Creatinine, Ser  Date Value Ref Range Status  03/09/2021 1.20 (H) 0.44 - 1.00 mg/dL Final          Failed - eGFR in normal range and within 360 days    GFR calc Af Amer  Date Value Ref Range Status  04/07/2020 60 >59 mL/min/1.73 Final    Comment:    **[facility] currently reports eGFR in compliance with the current**   recommendations of the [employer]. [facility] will   update reporting as  new guidelines are published from the NKF-ASN   Task force.    GFR, Estimated  Date Value Ref Range Status  03/09/2021 50 (L) >60 mL/min Final    Comment:    (NOTE) Calculated using the CKD-EPI Creatinine Equation (2021)           Passed - HBA1C is between 0 and 7.9 and within 180 days    Hgb A1c MFr Bld  Date Value Ref Range Status  03/09/2021 6.1 (H) 4.8 - 5.6 % Final    Comment:    (NOTE) Pre diabetes:          5.7%-6.4%  Diabetes:              >6.4%  Glycemic control for   <7.0% adults with diabetes           Passed - Valid encounter within last 6 months    Recent Outpatient Visits           2 days ago Annual physical exam   [facility] Glean Hess, MD   1 month ago Cellulitis of left hand   [facility] Glean Hess, MD   4 months ago [facility] Glean Hess, MD   7 months ago Acute non-recurrent maxillary sinusitis   [facility] Glean Hess, MD   11 months ago Essential (primary) hypertension   [facility] Glean Hess, MD       Future Appointments             In 1 month Virgel Manifold, MD [facility] [place]

## 2021-03-23 ENCOUNTER — Other Ambulatory Visit: Payer: Self-pay | Admitting: Internal Medicine

## 2021-03-23 DIAGNOSIS — M5116 Intervertebral disc disorders with radiculopathy, lumbar region: Secondary | ICD-10-CM

## 2021-03-23 DIAGNOSIS — M170 Bilateral primary osteoarthritis of knee: Secondary | ICD-10-CM

## 2021-03-23 MED ORDER — TRAMADOL HCL 50 MG PO TABS: 50.0000 mg | ORAL_TABLET | Freq: Four times a day (QID) | ORAL | 0 refills | Status: DC | PRN

## 2021-03-23 NOTE — Telephone Encounter (Signed)
Copied from [place] [phone]. Topic: Quick Communication - Rx Refill/Question >> Mar 23, 2021 10:36 AM Yvette Rack wrote: Medication: traMADol (ULTRAM) 50 MG tablet  Has the patient contacted their pharmacy? Yes.   (Agent: If no, request that the patient contact the pharmacy for the refill.) (Agent: If yes, when and what did the pharmacy advise?)  Preferred Pharmacy (with phone number or street name): [facility] - [place], [place]  Phone: [phone] Fax: [phone]  Has the patient been seen for an appointment in the last year OR does the patient have an upcoming appointment? Yes.    Agent: Please be advised that RX refills may take up to 3 business days. We ask that you follow-up with your pharmacy.

## 2021-03-23 NOTE — Telephone Encounter (Signed)
Requested medication (s) are due for refill today: yes  Requested medication (s) are on the active medication list: yes  Last refill:  11/11/20 #60 2 refills  Future visit scheduled: no  Notes to clinic:  not delegate per protocol     Requested Prescriptions  Pending Prescriptions Disp Refills   traMADol (ULTRAM) 50 MG tablet 60 tablet 2    Sig: Take 1 tablet (50 mg total) by mouth every 6 (six) hours as needed.     Not Delegated - Analgesics:  Opioid Agonists Failed - 03/23/2021 11:22 AM      Failed - This refill cannot be delegated      Failed - Urine Drug Screen completed in last 360 days      Passed - Valid encounter within last 6 months    Recent Outpatient Visits           2 weeks ago Annual physical exam   [facility] Glean Hess, MD   1 month ago Cellulitis of left hand   [facility] Glean Hess, MD   4 months ago [facility] Glean Hess, MD   8 months ago Acute non-recurrent maxillary sinusitis   [facility] Glean Hess, MD   11 months ago Essential (primary) hypertension   [facility] Glean Hess, MD       Future Appointments             In 3 weeks Virgel Manifold, MD [facility] [place]

## 2021-03-31 ENCOUNTER — Encounter: Payer: Self-pay | Admitting: Internal Medicine

## 2021-03-31 ENCOUNTER — Other Ambulatory Visit: Payer: Self-pay

## 2021-03-31 ENCOUNTER — Ambulatory Visit (INDEPENDENT_AMBULATORY_CARE_PROVIDER_SITE_OTHER): Payer: Medicare HMO | Admitting: Internal Medicine

## 2021-03-31 VITALS — BP 140/80 | HR 78 | Ht 66.0 in | Wt 180.4 lb

## 2021-03-31 DIAGNOSIS — B354 Tinea corporis: Secondary | ICD-10-CM

## 2021-03-31 DIAGNOSIS — R300 Dysuria: Secondary | ICD-10-CM

## 2021-03-31 LAB — POCT URINALYSIS DIPSTICK
Bilirubin, UA: NEGATIVE
Blood, UA: NEGATIVE
Glucose, UA: NEGATIVE
Ketones, UA: NEGATIVE
Leukocytes, UA: NEGATIVE
Nitrite, UA: NEGATIVE
Protein, UA: POSITIVE — AB
Spec Grav, UA: 1.015 (ref 1.010–1.025)
Urobilinogen, UA: 0.2 E.U./dL
pH, UA: 5 (ref 5.0–8.0)

## 2021-03-31 MED ORDER — SULFAMETHOXAZOLE-TRIMETHOPRIM 800-160 MG PO TABS: 1.0000 | ORAL_TABLET | Freq: Two times a day (BID) | ORAL | 0 refills | Status: AC

## 2021-03-31 MED ORDER — FLUCONAZOLE 100 MG PO TABS: 100.0000 mg | ORAL_TABLET | Freq: Every day | ORAL | 0 refills | Status: AC

## 2021-03-31 NOTE — Progress Notes (Signed)
Date:  03/31/2021   Name:  Kiara Campbell   DOB:  Nov 13, 1955   MRN:  [id number]   Chief Complaint: Vaginitis and Urinary Frequency  Vaginal Itching The patient's primary symptoms include genital itching and a genital rash. The patient's pertinent negatives include no vaginal discharge. The current episode started 1 to 4 weeks ago. The problem occurs constantly. The problem has been gradually worsening. The pain is moderate. She is not pregnant. Associated symptoms include frequency, rash and urgency. Pertinent negatives include no chills or fever. The symptoms are aggravated by urinating. She is sexually active. No, her partner does not have an STD. She is postmenopausal.  Urinary Frequency  Associated symptoms include frequency and urgency. Pertinent negatives include no chills.   Lab Results  Component Value Date   CREATININE 1.20 (H) 03/09/2021   BUN 33 (H) 03/09/2021   NA 138 03/09/2021   K 4.1 03/09/2021   CL 101 03/09/2021   CO2 29 03/09/2021   Lab Results  Component Value Date   CHOL 111 04/07/2020   HDL 44 04/07/2020   LDLCALC 39 04/07/2020   TRIG 171 (H) 04/07/2020   CHOLHDL 2.5 04/07/2020   Lab Results  Component Value Date   TSH 2.510 03/04/2019   Lab Results  Component Value Date   HGBA1C 6.1 (H) 03/09/2021   Lab Results  Component Value Date   WBC 8.6 03/09/2021   HGB 9.2 (L) 03/09/2021   HCT 27.6 (L) 03/09/2021   MCV 97.2 03/09/2021   PLT 332 03/09/2021   Lab Results  Component Value Date   ALT 22 03/09/2021   AST 22 03/09/2021   ALKPHOS 63 03/09/2021   BILITOT 0.7 03/09/2021     Review of Systems  Constitutional:  Negative for chills, fatigue and fever.  Respiratory:  Negative for chest tightness.   Genitourinary:  Positive for frequency and urgency. Negative for vaginal discharge.  Skin:  Positive for rash.   Patient Active Problem List   Diagnosis Date Noted   Esophageal dysphagia    Schatzki's ring    Gastric erythema    Hiatal  hernia    Special screening for malignant neoplasms, colon    Hx of skin cancer, basal cell 11/19/2018   Tobacco use disorder, severe, in early remission 08/09/2017   Obesity (BMI 30-39.9) 07/17/2017   GERD (gastroesophageal reflux disease) 07/12/2017   Primary osteoarthritis of both knees 08/01/2016   Type II diabetes mellitus with complication ([place]) 86/16/8372   Fibrocystic breast disease 12/03/2014   Colon polyp 12/03/2014   Diverticulitis of colon 12/03/2014   Essential (primary) hypertension 12/03/2014   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   OP (osteoporosis) 12/03/2014   Hyperlipidemia associated with type 2 diabetes mellitus ([place]) 12/03/2014    Allergies  Allergen Reactions   Glipizide Nausea Only    Past Surgical History:  Procedure Laterality Date   ABDOMINAL HYSTERECTOMY     partial   ABDOMINAL HYSTERECTOMY     APPENDECTOMY     BREAST CYST ASPIRATION     neg not sure which side   COLONOSCOPY WITH PROPOFOL N/A 02/03/2021   Procedure: COLONOSCOPY WITH PROPOFOL;  Surgeon: Virgel Manifold, MD;  Location: [facility] ENDOSCOPY;  Service: Endoscopy;  Laterality: N/A;   ESOPHAGOGASTRODUODENOSCOPY N/A 02/03/2021   Procedure: ESOPHAGOGASTRODUODENOSCOPY (EGD);  Surgeon: Virgel Manifold, MD;  Location: [facility] ENDOSCOPY;  Service: Endoscopy;  Laterality: N/A;   JOINT REPLACEMENT     RECTAL PROLAPSE REPAIR     REPLACEMENT  TOTAL KNEE Right 07/25/2017   TONSILLECTOMY     VARICOSE VEIN SURGERY      Social History   Tobacco Use   Smoking status: Former    Packs/day: 0.50    Years: 30.00    Pack years: 15.00    Types: Cigarettes    Quit date: 08/04/2018    Years since quitting: 2.6   Smokeless tobacco: Never  Vaping Use   Vaping Use: Never used  Substance Use Topics   Alcohol use: Yes    Comment: occasional   Drug use: No     Medication list has been reviewed and updated.  Current Meds  Medication Sig   Alcohol Swabs (B-D SINGLE USE SWABS REGULAR)  PADS USE DAILY.   allopurinol (ZYLOPRIM) 300 MG tablet TAKE 1 TABLET EVERY DAY   azelastine (ASTELIN) 0.1 % nasal spray Place 1 spray into both nostrils 2 (two) times daily. Use in each nostril as directed   Calcium Carb-Ergocalciferol 500-200 MG-UNIT TABS Take by mouth.   celecoxib (CELEBREX) 200 MG capsule Take 1 capsule (200 mg total) by mouth 2 (two) times daily.   colchicine (COLCRYS) 0.6 MG tablet Take 1 tablet (0.6 mg total) by mouth 2 (two) times daily.   famotidine (PEPCID) 20 MG tablet Take 1 tablet (20 mg total) by mouth daily.   Ferrous Sulfate (IRON PO) Take by mouth.   fexofenadine (ALLEGRA) 60 MG tablet Take 60 mg by mouth 2 (two) times daily.   FIBER PO Take by mouth in the morning and at bedtime.   fluconazole (DIFLUCAN) 100 MG tablet Take 1 tablet (100 mg total) by mouth daily for 7 days.   fluticasone (FLONASE) 50 MCG/ACT nasal spray    gabapentin (NEURONTIN) 100 MG capsule TAKE 2 CAPSULES TWICE DAILY   glucose blood (TRUE METRIX BLOOD GLUCOSE TEST) test strip TEST EVERY DAY   Lancet Devices (ACCU-CHEK SOFTCLIX) lancets 1 each by Other route daily. Use as instructed   Lancets Misc. (ACCU-CHEK SOFTCLIX LANCET DEV) KIT 1 each by Does not apply route daily.   losartan-hydrochlorothiazide (HYZAAR) 100-25 MG tablet Take 1 tablet by mouth daily.   metFORMIN (GLUCOPHAGE-XR) 500 MG 24 hr tablet Take 1 tablet (500 mg total) by mouth daily with breakfast.   metoprolol succinate (TOPROL-XL) 50 MG 24 hr tablet Take 3 tablets (150 mg total) by mouth daily. Take with or immediately following a meal.   mupirocin ointment (BACTROBAN) 2 % PLACE 1 APPLICATION INTO THE NOSE 2 TIMES A DAILY.   simvastatin (ZOCOR) 40 MG tablet Take 1 tablet (40 mg total) by mouth at bedtime.   sulfamethoxazole-trimethoprim (BACTRIM DS) 800-160 MG tablet Take 1 tablet by mouth 2 (two) times daily for 7 days.   traMADol (ULTRAM) 50 MG tablet Take 1 tablet (50 mg total) by mouth every 6 (six) hours as needed.    vitamin C (ASCORBIC ACID) 500 MG tablet Take 500 mg by mouth daily.    PHQ 2/9 Scores 03/31/2021 03/09/2021 02/01/2021 11/11/2020  PHQ - 2 Score _0 PHQ- 9 Score _1 Exception Documentation - - - -    GAD 7 : Generalized Anxiety Score 03/31/2021 03/09/2021 02/01/2021 11/11/2020  Nervous, Anxious, on Edge 2 0 0 2  Control/stop worrying _2 Worry too much - different things 0 _3 Trouble relaxing _4 Restless _5 0  Easily annoyed or irritable 1 0 1 2  Afraid -  awful might happen 0 0 0 1  Total GAD 7 Score _0 Anxiety Difficulty Not difficult at all - - Somewhat difficult    BP Readings from Last 3 Encounters:  03/31/21 140/80  03/09/21 (!) 144/68  02/03/21 117/63    Physical Exam Constitutional:      General: She is in acute distress.     Appearance: Normal appearance.  Cardiovascular:     Rate and Rhythm: Normal rate and regular rhythm.  Pulmonary:     Effort: Pulmonary effort is normal.     Breath sounds: Normal breath sounds. No wheezing or rhonchi.  Abdominal:     General: Abdomen is flat. Bowel sounds are normal.     Palpations: Abdomen is soft.     Tenderness: There is no abdominal tenderness.  Genitourinary:    Labia:        Right: Rash present.        Left: Rash present.     Musculoskeletal:     Cervical back: Normal range of motion.  Lymphadenopathy:     Cervical: No cervical adenopathy.  Skin:    General: Skin is warm.     Findings: Erythema and rash present.     Comments: Red tinea rash in both axilla and in groin, on labia and in the perineum.  There is one inch split in the skin between the gluteus muscles  Neurological:     General: No focal deficit present.     Mental Status: She is alert.    Wt Readings from Last 3 Encounters:  03/31/21 180 lb 6.4 oz (81.8 kg)  03/09/21 177 lb (80.3 kg)  02/03/21 170 lb 2.1 oz (77.2 kg)    BP 140/80   Pulse 78   Ht _1  (1.676 m)   Wt 180 lb 6.4 oz (81.8 kg)   LMP 09/27/1995    SpO2 98%   BMI 29.12 kg/m   Assessment and Plan: 1. Tinea corporis May continue topical antifungals for comfort but dry the skin thoroughly after bathing Hold Simvastatin while taking diflucan. - fluconazole (DIFLUCAN) 100 MG tablet; Take 1 tablet (100 mg total) by mouth daily for 7 days.  Dispense: 7 tablet; Refill: 0  2. Dysuria UA unremarkable.  There may be some bacterial component to candida rash so will cover both urinary tract and skin with Bactrim. - sulfamethoxazole-trimethoprim (BACTRIM DS) 800-160 MG tablet; Take 1 tablet by mouth 2 (two) times daily for 7 days.  Dispense: 14 tablet; Refill: 0 - POCT urinalysis dipstick   Partially dictated using Editor, commissioning. Any errors are unintentional.  Halina Maidens, MD [facility]  03/31/2021

## 2021-03-31 NOTE — Patient Instructions (Signed)
Hold the simvastatin while taking the diflucan.

## 2021-04-07 ENCOUNTER — Other Ambulatory Visit: Payer: Self-pay | Admitting: Internal Medicine

## 2021-04-07 ENCOUNTER — Other Ambulatory Visit: Payer: Self-pay

## 2021-04-07 DIAGNOSIS — E118 Type 2 diabetes mellitus with unspecified complications: Secondary | ICD-10-CM

## 2021-04-07 DIAGNOSIS — M5116 Intervertebral disc disorders with radiculopathy, lumbar region: Secondary | ICD-10-CM

## 2021-04-07 DIAGNOSIS — M170 Bilateral primary osteoarthritis of knee: Secondary | ICD-10-CM

## 2021-04-07 MED ORDER — ACCU-CHEK SOFTCLIX LANCET DEV KIT: 1.0000 | PACK | Freq: Every day | 0 refills | Status: AC

## 2021-04-07 MED ORDER — ACCU-CHEK SOFTCLIX LANCETS MISC: 5 refills | Status: DC

## 2021-04-07 NOTE — Telephone Encounter (Signed)
Please review RF.

## 2021-04-07 NOTE — Telephone Encounter (Signed)
Requested medication (s) are due for refill today: DUe 04/12/21  Requested medication (s) are on the active medication list: yes    Last refill: 03/23/21  #60  0 refills  Future visit scheduled  yes 07/23/21  Notes to clinic: Not delegated  Requested Prescriptions  Pending Prescriptions Disp Refills   traMADol (ULTRAM) 50 MG tablet [Pharmacy Med Name: TRAMADOL HYDROCHLORIDE 50 MG Tablet] 60 tablet     Sig: TAKE 1 TABLET EVERY 6 HOURS AS NEEDED     Not Delegated - Analgesics:  Opioid Agonists Failed - 04/07/2021 12:36 PM      Failed - This refill cannot be delegated      Failed - Urine Drug Screen completed in last 360 days      Passed - Valid encounter within last 6 months    Recent Outpatient Visits           1 week ago Tinea corporis   [facility] Glean Hess, MD   4 weeks ago Annual physical exam   [facility] Glean Hess, MD   2 months ago Cellulitis of left hand   [facility] Glean Hess, MD   4 months ago [facility] Glean Hess, MD   8 months ago Acute non-recurrent maxillary sinusitis   [facility] Glean Hess, MD       Future Appointments             In 2 weeks Virgel Manifold, MD [facility]   In 3 months Glean Hess, MD [facility], [facility]

## 2021-04-08 NOTE — Telephone Encounter (Signed)
Please review. Last office visit 03/31/2021.  KP

## 2021-04-09 ENCOUNTER — Encounter: Payer: Self-pay | Admitting: Internal Medicine

## 2021-04-09 ENCOUNTER — Other Ambulatory Visit: Payer: Self-pay | Admitting: Internal Medicine

## 2021-04-09 DIAGNOSIS — B354 Tinea corporis: Secondary | ICD-10-CM

## 2021-04-09 MED ORDER — FLUCONAZOLE 100 MG PO TABS: 100.0000 mg | ORAL_TABLET | ORAL | 0 refills | Status: AC

## 2021-04-13 ENCOUNTER — Ambulatory Visit: Payer: Medicare HMO | Admitting: Gastroenterology

## 2021-04-14 ENCOUNTER — Ambulatory Visit
Admission: RE | Admit: 2021-04-14 | Discharge: 2021-04-14 | Disposition: A | Discharge status: Home / Self Care | Payer: Medicare HMO | Source: Ambulatory Visit | Attending: Internal Medicine | Admitting: Internal Medicine

## 2021-04-14 ENCOUNTER — Other Ambulatory Visit: Payer: Self-pay

## 2021-04-14 DIAGNOSIS — Z1231 Encounter for screening mammogram for malignant neoplasm of breast: Secondary | ICD-10-CM | POA: Diagnosis not present

## 2021-04-26 ENCOUNTER — Other Ambulatory Visit: Payer: Self-pay

## 2021-04-26 ENCOUNTER — Encounter: Payer: Self-pay | Admitting: Gastroenterology

## 2021-04-26 ENCOUNTER — Ambulatory Visit: Payer: Medicare HMO | Admitting: Gastroenterology

## 2021-04-26 VITALS — BP 157/82 | HR 67 | Temp 97.4°F | Wt 179.0 lb

## 2021-04-26 DIAGNOSIS — R1319 Other dysphagia: Secondary | ICD-10-CM

## 2021-04-26 DIAGNOSIS — D649 Anemia, unspecified: Secondary | ICD-10-CM

## 2021-04-26 DIAGNOSIS — Z791 Long term (current) use of non-steroidal anti-inflammatories (NSAID): Secondary | ICD-10-CM | POA: Diagnosis not present

## 2021-04-26 MED ORDER — OMEPRAZOLE 20 MG PO CPDR: 20.0000 mg | DELAYED_RELEASE_CAPSULE | Freq: Every day | ORAL | 0 refills | Status: DC

## 2021-04-26 NOTE — Patient Instructions (Addendum)
Hematology referral placed, they will be calling you to set up appointment.

## 2021-04-26 NOTE — Progress Notes (Signed)
Melodie Bouillon, MD [address]  [address]  [place], [place] [address]  Main: [phone]  Fax: [phone]   Primary Care Physician: Reubin Milan, MD   Chief Complaint  Patient presents with   Follow-up    3 month   Anemia, dysphagia  HPI: Kiara Campbell is a 65 y.o. female here for follow-up.  Reports dysphagia has completely resolved.  No solid or liquid dysphagia at this point.  No abdominal pain, no nausea or vomiting.  States she is on celecoxib and is the only thing that helps her and so she has to continue taking it for her arthritis.  Is taking Pepcid but stopped taking her omeprazole.  Denies any heartburn.  States she finally heard from [facility] in regard to the rectal polyp below the anal verge seen on colonoscopy and is planning to go to that upcoming appointment  Patient recently underwent EGD and colonoscopy EGD showed gastric intestinal metaplasia on biopsies of a gastric nodule.  Gastric erythema, widely patent Schatzki's ring, hiatal hernia.  Biopsies were negative for EOE.  Colonoscopy with 4 subcentimeter polyps removed.  Diverticulosis.  Tortuous colon.  4 mm polyp seen below the anal verge that was not biopsied or removed and has been referred to [facility] for the same.  PCP note reviewed from 03/09/2021, and they are considering doing Celebrex for osteoarthritis of the knees.  They refilled Pepcid for GERD.  Most recent CBC shows improved hemoglobin to 9.2.  Most recent liver enzymes normal on 03/09/2021  ROS: All ROS reviewed and negative except as per HPI   Past Medical History:  Diagnosis Date   Calcium blood increased 12/23/2013   Diabetes mellitus without complication (HCC)    Hyperlipidemia    Hypertension    Osteopenia     Past Surgical History:  Procedure Laterality Date   ABDOMINAL HYSTERECTOMY     partial   ABDOMINAL HYSTERECTOMY     APPENDECTOMY     BREAST CYST ASPIRATION     neg not sure which side    COLONOSCOPY WITH PROPOFOL N/A 02/03/2021   Procedure: COLONOSCOPY WITH PROPOFOL;  Surgeon: Pasty Spillers, MD;  Location: [facility] ENDOSCOPY;  Service: Endoscopy;  Laterality: N/A;   ESOPHAGOGASTRODUODENOSCOPY N/A 02/03/2021   Procedure: ESOPHAGOGASTRODUODENOSCOPY (EGD);  Surgeon: Pasty Spillers, MD;  Location: [facility] ENDOSCOPY;  Service: Endoscopy;  Laterality: N/A;   JOINT REPLACEMENT     RECTAL PROLAPSE REPAIR     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   TONSILLECTOMY     VARICOSE VEIN SURGERY      Prior to Admission medications   Medication Sig Start Date End Date Taking? Authorizing Provider  Accu-Chek Softclix Lancets lancets Use as instructed 04/07/21  Yes Reubin Milan, MD  Alcohol Swabs (B-D SINGLE USE SWABS REGULAR) PADS USE DAILY. 03/19/20  Yes Reubin Milan, MD  allopurinol (ZYLOPRIM) 300 MG tablet TAKE 1 TABLET EVERY DAY 03/11/21  Yes Reubin Milan, MD  azelastine (ASTELIN) 0.1 % nasal spray Place 1 spray into both nostrils 2 (two) times daily. Use in each nostril as directed 03/09/21  Yes Reubin Milan, MD  Calcium Carb-Ergocalciferol 500-200 MG-UNIT TABS Take by mouth.   Yes [provider]  celecoxib (CELEBREX) 200 MG capsule Take 1 capsule (200 mg total) by mouth 2 (two) times daily. 03/09/21  Yes Reubin Milan, MD  colchicine (COLCRYS) 0.6 MG tablet Take 1 tablet (0.6 mg total) by mouth 2 (two) times daily. 08/21/19  Yes Reubin Milan, MD  Ferrous Sulfate (IRON PO) Take by mouth.   Yes [provider]  fexofenadine (ALLEGRA) 60 MG tablet Take 60 mg by mouth 2 (two) times daily.   Yes [provider]  FIBER PO Take by mouth in the morning and at bedtime.   Yes [provider]  fluticasone Aleda Grana) 50 MCG/ACT nasal spray  02/12/15  Yes [provider]  gabapentin (NEURONTIN) 100 MG capsule TAKE 2 CAPSULES TWICE DAILY 11/15/20  Yes Reubin Milan, MD  glucose blood (TRUE METRIX BLOOD GLUCOSE TEST) test strip TEST  EVERY DAY 03/11/21  Yes Reubin Milan, MD  Lancet Devices [facility]) lancets 1 each by Other route daily. Use as instructed 03/12/15  Yes Reubin Milan, MD  Lancets Misc. (ACCU-CHEK SOFTCLIX LANCET DEV) KIT 1 each by Does not apply route daily. 04/07/21  Yes Reubin Milan, MD  losartan-hydrochlorothiazide (HYZAAR) 100-25 MG tablet Take 1 tablet by mouth daily. 03/09/21  Yes Reubin Milan, MD  metFORMIN (GLUCOPHAGE-XR) 500 MG 24 hr tablet Take 1 tablet (500 mg total) by mouth daily with breakfast. 03/09/21  Yes Reubin Milan, MD  metoprolol succinate (TOPROL-XL) 50 MG 24 hr tablet Take 3 tablets (150 mg total) by mouth daily. Take with or immediately following a meal. 03/09/21  Yes Reubin Milan, MD  mupirocin ointment (BACTROBAN) 2 % PLACE 1 APPLICATION INTO THE NOSE 2 TIMES A DAILY. 07/15/20  Yes Reubin Milan, MD  omeprazole (PRILOSEC) 20 MG capsule Take 1 capsule (20 mg total) by mouth daily. 04/26/21 07/25/21 Yes Pasty Spillers, MD  simvastatin (ZOCOR) 40 MG tablet Take 1 tablet (40 mg total) by mouth at bedtime. 03/09/21  Yes Reubin Milan, MD  traMADol (ULTRAM) 50 MG tablet Take 1 tablet (50 mg total) by mouth every 6 (six) hours as needed. 03/23/21  Yes Reubin Milan, MD  vitamin C (ASCORBIC ACID) 500 MG tablet Take 500 mg by mouth daily.   Yes [provider]    Family History  Problem Relation Age of Onset   CAD Father        died age 46   COPD Mother    Diabetes Maternal Grandmother    Breast cancer Neg Hx      Social History   Tobacco Use   Smoking status: Former    Packs/day: 0.50    Years: 30.00    Pack years: 15.00    Types: Cigarettes    Quit date: 08/04/2018    Years since quitting: 2.7   Smokeless tobacco: Never  Vaping Use   Vaping Use: Never used  Substance Use Topics   Alcohol use: Yes    Comment: occasional   Drug use: No    Allergies as of 04/26/2021 - Review Complete 04/26/2021  Allergen Reaction Noted    Glipizide Nausea Only 09/28/2015    Physical Examination:  Constitutional: General:   Alert,  Well-developed, well-nourished, pleasant and cooperative in NAD BP (!) 157/82   Pulse 67   Temp (!) 97.4 F (36.3 C) (Oral)   Wt 179 lb (81.2 kg)   LMP 09/27/1995   BMI 28.89 kg/m   Respiratory: Normal respiratory effort  Gastrointestinal:  Soft, non-tender and non-distended without masses, hepatosplenomegaly or hernias noted.  No guarding or rebound tenderness.     Cardiac: No clubbing or edema.  No cyanosis. Normal posterior tibial pedal pulses noted.  Psych:  Alert and cooperative. Normal mood and affect.  Musculoskeletal:  Normal gait. Head normocephalic, atraumatic. Symmetrical without gross deformities. 5/5 Lower extremity strength bilaterally.  Skin: Warm. Intact without significant lesions or rashes. No jaundice.  Neck: Supple, trachea midline  Lymph: No cervical lymphadenopathy  Psych:  Alert and oriented x3, Alert and cooperative. Normal mood and affect.  Labs: CMP     Component Value Date/Time   NA 138 03/09/2021 1545   NA 141 04/07/2020 1356   K 4.1 03/09/2021 1545   CL 101 03/09/2021 1545   CO2 29 03/09/2021 1545   GLUCOSE 120 (H) 03/09/2021 1545   BUN 33 (H) 03/09/2021 1545   BUN 23 04/07/2020 1356   CREATININE 1.20 (H) 03/09/2021 1545   CALCIUM 10.4 (H) 03/09/2021 1545   PROT 8.1 03/09/2021 1545   PROT 7.3 04/07/2020 1356   ALBUMIN 4.5 03/09/2021 1545   ALBUMIN 4.7 04/07/2020 1356   AST 22 03/09/2021 1545   ALT 22 03/09/2021 1545   ALKPHOS 63 03/09/2021 1545   BILITOT 0.7 03/09/2021 1545   BILITOT 0.4 04/07/2020 1356   GFRNONAA 50 (L) 03/09/2021 1545   GFRAA 60 04/07/2020 1356   Lab Results  Component Value Date   WBC 8.6 03/09/2021   HGB 9.2 (L) 03/09/2021   HCT 27.6 (L) 03/09/2021   MCV 97.2 03/09/2021   PLT 332 03/09/2021    Imaging Studies:   Assessment and Plan:   XAVIA KNISKERN is a 65 y.o. y/o female here for follow-up of  dysphagia, anemia  Most recent labs did not show iron deficiency Patient agreeable to hematology referral for chronic anemia  Discussed need for repeat EGD for gastric mapping biopsies.  Patient states she would rather get it done in early 2023.  Recall will be placed in chart for repeat EGD and clinic follow-up around that time  No further dysphagia  Follow-up with [place] [facility] and patient has an upcoming appointment for small polyp seen below the anal verge  Patient is on chronic celecoxib.  Discontinue Pepcid as patient is not having any heartburn.  However, low-dose omeprazole is reasonable to prevent gastric ulcers.  (Risks of PPI use were discussed with patient including bone loss, C. Diff diarrhea, pneumonia, infections, CKD, electrolyte abnormalities.  Pt. Verbalizes understanding and chooses to continue the medication.)  Patient is above 6 years of age which is considered a risk factor for gastroduodenal toxicity with NSAID to use.  Just 1 of these risk factors according to [facility] 2009 guidelines qualifies as moderate risk.  High-dose NSAID use can be a second risk factor as well.  Therefore, PPI therapy indicated at this time and as per below as well  As per up-to-date: "Proton pump inhibitors -- PPIs are useful for the prevention of NSAID-induced and low-dose aspirin-induced ulcers [32-37]. The Prospective Randomized Evaluation of Celecoxib Integrated Safety (PRECISION) trial randomized OA or RA patients whose arthritis pain was resistant to acetaminophen and who were at increased risk for cardiovascular disease (nearly one-half taking low-dose aspirin), but who were not at very high risk for ulcer disease (recent ulcer disease was an exclusionary criteria), to receive celecoxib (100 mg bid), ibuprofen (600 mg tid), or naproxen (375 mg bid) as initial NSAID doses to control arthritis pain plus esomeprazole (20 or 40 mg per day in each group). Clinically significant gastrointestinal  (GI) events during a median follow up period of 34 months occurred in only 0.7 percent, 0.9 percent, and 0.7 percent of patients, respectively.  For patients who have multiple risk factors for NSAID-related gastroduodenal  toxicity, options include therapy with a coxib or a nonselective NSAID in combination with a proton pump inhibitor (PPI) or misoprostol. High-dose H2 receptor antagonists are reserved for patients who cannot tolerate PPIs or misoprostol. (See 'Risk factors' above and 'Selective COX-2 inhibitors (COXIBS)' above and 'Proton pump inhibitors' above.)  ?We recommend co-administration of a PPI in patients who require an NSAID and are at high or moderate risk for GI toxicity (Grade 1B). (See 'Risk factors' above.) We prefer PPIs to other preventive approaches because of their convenience and relatively good safety profile."   Dr [name]

## 2021-05-12 DIAGNOSIS — K626 Ulcer of anus and rectum: Secondary | ICD-10-CM | POA: Diagnosis not present

## 2021-05-31 DIAGNOSIS — H524 Presbyopia: Secondary | ICD-10-CM | POA: Diagnosis not present

## 2021-05-31 DIAGNOSIS — Z0100 Encounter for examination of eyes and vision without abnormal findings: Secondary | ICD-10-CM | POA: Diagnosis not present

## 2021-06-02 ENCOUNTER — Other Ambulatory Visit: Payer: Self-pay | Admitting: Internal Medicine

## 2021-06-02 DIAGNOSIS — M5116 Intervertebral disc disorders with radiculopathy, lumbar region: Secondary | ICD-10-CM

## 2021-06-02 DIAGNOSIS — M170 Bilateral primary osteoarthritis of knee: Secondary | ICD-10-CM

## 2021-06-04 ENCOUNTER — Other Ambulatory Visit: Payer: Self-pay | Admitting: Internal Medicine

## 2021-06-04 DIAGNOSIS — M170 Bilateral primary osteoarthritis of knee: Secondary | ICD-10-CM

## 2021-06-04 DIAGNOSIS — M5116 Intervertebral disc disorders with radiculopathy, lumbar region: Secondary | ICD-10-CM

## 2021-06-04 NOTE — Telephone Encounter (Signed)
Please review. Last office visit 03/31/2021.  KP

## 2021-06-04 NOTE — Telephone Encounter (Signed)
Requested medication (s) are due for refill today: yes  Requested medication (s) are on the active medication list: yes  Last refill:  04/22/21  Future visit scheduled: yes, 07/23/21  Notes to clinic:  pharmacy request, not delegated.      Requested Prescriptions  Pending Prescriptions Disp Refills   traMADol (ULTRAM) 50 MG tablet [Pharmacy Med Name: traMADol HCl 50 MG Oral Tablet] 28 tablet 0    Sig: TAKE 1 TABLET BY MOUTH EVERY 6 HOURS AS NEEDED     Not Delegated - Analgesics:  Opioid Agonists Failed - 06/02/2021  5:02 PM      Failed - This refill cannot be delegated      Failed - Urine Drug Screen completed in last 360 days      Passed - Valid encounter within last 6 months    Recent Outpatient Visits           2 months ago Tinea corporis   [facility] Glean Hess, MD   2 months ago Annual physical exam   [facility] Glean Hess, MD   4 months ago Cellulitis of left hand   [facility] Glean Hess, MD   6 months ago [facility] Glean Hess, MD   10 months ago Acute non-recurrent maxillary sinusitis   [facility] Glean Hess, MD       Future Appointments             In 1 month Army Melia, Jesse Sans, MD [facility], [facility]

## 2021-06-05 NOTE — Telephone Encounter (Signed)
Requested medication (s) are due for refill today: no  Requested medication (s) are on the active medication list: yes  Last refill:  06/04/21 #60   Future visit scheduled: yes  Notes to clinic:  med not delegated to NT to RF or refuse   Requested Prescriptions  Pending Prescriptions Disp Refills   traMADol (ULTRAM) 50 MG tablet [Pharmacy Med Name: TRAMADOL HYDROCHLORIDE 50 MG Tablet] 60 tablet     Sig: TAKE 1 TABLET EVERY 6 HOURS AS NEEDED     Not Delegated - Analgesics:  Opioid Agonists Failed - 06/04/2021  1:43 PM      Failed - This refill cannot be delegated      Failed - Urine Drug Screen completed in last 360 days      Passed - Valid encounter within last 6 months    Recent Outpatient Visits           2 months ago Tinea corporis   [facility] Glean Hess, MD   2 months ago Annual physical exam   [facility] Glean Hess, MD   4 months ago Cellulitis of left hand   [facility] Glean Hess, MD   6 months ago [facility] Glean Hess, MD   10 months ago Acute non-recurrent maxillary sinusitis   [facility] Glean Hess, MD       Future Appointments             In 1 month Army Melia, Jesse Sans, MD [facility], [facility]

## 2021-06-07 ENCOUNTER — Other Ambulatory Visit: Payer: Self-pay | Admitting: Internal Medicine

## 2021-06-07 DIAGNOSIS — M5116 Intervertebral disc disorders with radiculopathy, lumbar region: Secondary | ICD-10-CM

## 2021-06-07 DIAGNOSIS — M170 Bilateral primary osteoarthritis of knee: Secondary | ICD-10-CM

## 2021-06-07 NOTE — Telephone Encounter (Signed)
Requested medication (s) are due for refill today:no  Requested medication (s) are on the active medication list: yes  Last refill: 06/04/21 #60  0 refills  Future visit scheduled yes 07/23/20   Not delegated, requested via Interface  Requested Prescriptions  Pending Prescriptions Disp Refills   traMADol (ULTRAM) 50 MG tablet [Pharmacy Med Name: TRAMADOL HYDROCHLORIDE 50 MG Tablet] 60 tablet     Sig: TAKE 1 TABLET EVERY 6 HOURS AS NEEDED     Not Delegated - Analgesics:  Opioid Agonists Failed - 06/07/2021  9:16 AM      Failed - This refill cannot be delegated      Failed - Urine Drug Screen completed in last 360 days      Passed - Valid encounter within last 6 months    Recent Outpatient Visits           2 months ago Tinea corporis   [facility] Glean Hess, MD   3 months ago Annual physical exam   [facility] Glean Hess, MD   4 months ago Cellulitis of left hand   [facility] Glean Hess, MD   6 months ago [facility] Glean Hess, MD   10 months ago Acute non-recurrent maxillary sinusitis   [facility] Glean Hess, MD       Future Appointments             In 1 month Army Melia, Jesse Sans, MD [facility], [facility]

## 2021-06-21 ENCOUNTER — Telehealth: Payer: Self-pay | Admitting: Internal Medicine

## 2021-06-21 NOTE — Telephone Encounter (Signed)
Patient declined the Medicare Wellness Visit with [facility]   Does not feel it is necessary and asked not to be called back to schedule AWV

## 2021-07-06 ENCOUNTER — Encounter: Payer: Self-pay | Admitting: Internal Medicine

## 2021-07-06 ENCOUNTER — Other Ambulatory Visit: Payer: Self-pay

## 2021-07-06 MED ORDER — COLCHICINE 0.6 MG PO TABS: 0.6000 mg | ORAL_TABLET | Freq: Two times a day (BID) | ORAL | 0 refills | Status: DC

## 2021-07-13 DIAGNOSIS — K626 Ulcer of anus and rectum: Secondary | ICD-10-CM | POA: Diagnosis not present

## 2021-07-13 DIAGNOSIS — J439 Emphysema, unspecified: Secondary | ICD-10-CM | POA: Diagnosis not present

## 2021-07-22 ENCOUNTER — Other Ambulatory Visit: Payer: Self-pay | Admitting: Internal Medicine

## 2021-07-22 DIAGNOSIS — M5116 Intervertebral disc disorders with radiculopathy, lumbar region: Secondary | ICD-10-CM

## 2021-07-22 DIAGNOSIS — M170 Bilateral primary osteoarthritis of knee: Secondary | ICD-10-CM

## 2021-07-22 DIAGNOSIS — E1169 Type 2 diabetes mellitus with other specified complication: Secondary | ICD-10-CM

## 2021-07-22 DIAGNOSIS — E785 Hyperlipidemia, unspecified: Secondary | ICD-10-CM

## 2021-07-22 DIAGNOSIS — M10071 Idiopathic gout, right ankle and foot: Secondary | ICD-10-CM

## 2021-07-22 DIAGNOSIS — E118 Type 2 diabetes mellitus with unspecified complications: Secondary | ICD-10-CM

## 2021-07-22 NOTE — Telephone Encounter (Signed)
Requested medication (s) are due for refill today: yes  Requested medication (s) are on the active medication list: yes  Last refill:  Ultram 06/04/21. Zocor, Glucophage, Celebrex 03/09/21, Allopurinol 03/11/21  Future visit scheduled: tomorrow 07/23/21  Notes to clinic:  Ultram, not delegated, Zocor failed protocol of labs within 360 days, (04/07/2020). The others pass protocol, uses mail order pharm, has appt tomorrow, 07/23/21, please assess.    Requested Prescriptions  Pending Prescriptions Disp Refills   traMADol (ULTRAM) 50 MG tablet [Pharmacy Med Name: TRAMADOL HYDROCHLORIDE 50 MG Tablet] 60 tablet     Sig: TAKE 1 TABLET EVERY 6 HOURS AS NEEDED     Not Delegated - Analgesics:  Opioid Agonists Failed - 07/22/2021  7:16 PM      Failed - This refill cannot be delegated      Failed - Urine Drug Screen completed in last 360 days      Passed - Valid encounter within last 6 months    Recent Outpatient Visits           3 months ago Tinea corporis   [facility] Glean Hess, MD   4 months ago Annual physical exam   [facility] Glean Hess, MD   5 months ago Cellulitis of left hand   [facility] Glean Hess, MD   8 months ago [facility] Glean Hess, MD   1 year ago Acute non-recurrent maxillary sinusitis   [facility] Glean Hess, MD       Future Appointments             Tomorrow Glean Hess, MD [facility]             simvastatin (ZOCOR) 40 MG tablet [Pharmacy Med Name: SIMVASTATIN 40 MG Tablet] 90 tablet 1    Sig: TAKE 1 TABLET AT BEDTIME     Cardiovascular:  Antilipid - Statins Failed - 07/22/2021  7:16 PM      Failed - Total Cholesterol in normal range and within 360 days    Cholesterol, Total  Date Value Ref Range Status  04/07/2020 111 100 - 199 mg/dL Final          Failed - LDL in normal range and within 360 days    LDL Chol Calc (NIH)  Date Value  Ref Range Status  04/07/2020 39 0 - 99 mg/dL Final          Failed - HDL in normal range and within 360 days    HDL  Date Value Ref Range Status  04/07/2020 44 >39 mg/dL Final          Failed - Triglycerides in normal range and within 360 days    Triglycerides  Date Value Ref Range Status  04/07/2020 171 (H) 0 - 149 mg/dL Final          Passed - Patient is not pregnant      Passed - Valid encounter within last 12 months    Recent Outpatient Visits           3 months ago Tinea corporis   [facility] Glean Hess, MD   4 months ago Annual physical exam   [facility] Glean Hess, MD   5 months ago Cellulitis of left hand   [facility] Glean Hess, MD   8 months ago [facility] Glean Hess,  MD   1 year ago Acute non-recurrent maxillary sinusitis   [facility] Glean Hess, MD       Future Appointments             Tomorrow Glean Hess, MD [facility]             metFORMIN (GLUCOPHAGE-XR) 500 MG 24 hr tablet [Pharmacy Med Name: METFORMIN HYDROCHLORIDE ER 500 MG Tablet Extended Release 24 Hour] 90 tablet 1    Sig: TAKE 1 [place]     Endocrinology:  Diabetes - Biguanides Failed - 07/22/2021  7:16 PM      Failed - Cr in normal range and within 360 days    Creatinine, Ser  Date Value Ref Range Status  03/09/2021 1.20 (H) 0.44 - 1.00 mg/dL Final          Failed - eGFR in normal range and within 360 days    GFR calc Af Amer  Date Value Ref Range Status  04/07/2020 60 >59 mL/min/1.73 Final    Comment:    **[facility] currently reports eGFR in compliance with the current**   recommendations of the [employer]. [facility] will   update reporting as new guidelines are published from the NKF-ASN   Task force.    GFR, Estimated  Date Value Ref Range Status  03/09/2021 50 (L) >60 mL/min Final    Comment:     (NOTE) Calculated using the CKD-EPI Creatinine Equation (2021)           Passed - HBA1C is between 0 and 7.9 and within 180 days    Hgb A1c MFr Bld  Date Value Ref Range Status  03/09/2021 6.1 (H) 4.8 - 5.6 % Final    Comment:    (NOTE) Pre diabetes:          5.7%-6.4%  Diabetes:              >6.4%  Glycemic control for   <7.0% adults with diabetes           Passed - Valid encounter within last 6 months    Recent Outpatient Visits           3 months ago Tinea corporis   [facility] Glean Hess, MD   4 months ago Annual physical exam   [facility] Glean Hess, MD   5 months ago Cellulitis of left hand   [facility] Glean Hess, MD   8 months ago [facility] Glean Hess, MD   1 year ago Acute non-recurrent maxillary sinusitis   [facility] Glean Hess, MD       Future Appointments             Tomorrow Glean Hess, MD [facility]             allopurinol (ZYLOPRIM) 300 MG tablet [Pharmacy Med Name: ALLOPURINOL 300 MG Tablet] 90 tablet 0    Sig: TAKE 1 [place]     Endocrinology:  Gout Agents Failed - 07/22/2021  7:16 PM      Failed - Uric Acid in normal range and within 360 days    Uric Acid  Date Value Ref Range Status  02/28/2018 4.7 2.5 - 7.1 mg/dL Final    Comment:               Therapeutic target for gout  patients: <6.0          Failed - Cr in normal range and within 360 days    Creatinine, Ser  Date Value Ref Range Status  03/09/2021 1.20 (H) 0.44 - 1.00 mg/dL Final          Passed - Valid encounter within last 12 months    Recent Outpatient Visits           3 months ago Tinea corporis   [facility] Glean [facility], MD   4 months ago Annual physical exam   [facility] Glean Hess, MD   5 months ago Cellulitis of left hand   [facility] Glean Hess, MD   8 months ago [facility] Glean Hess, MD   1 year ago Acute non-recurrent maxillary sinusitis   [facility] Glean Hess, MD       Future Appointments             Tomorrow Glean Hess, MD [facility]             celecoxib (CELEBREX) 200 MG capsule [Pharmacy Med Name: CELECOXIB 200 MG Capsule] 180 capsule 1    Sig: TAKE 1 CAPSULE TWICE DAILY     Analgesics:  COX2 Inhibitors Failed - 07/22/2021  7:16 PM      Failed - HGB in normal range and within 360 days    Hemoglobin  Date Value Ref Range Status  03/09/2021 9.2 (L) 12.0 - 15.0 g/dL Final  11/11/2020 7.8 (L) 11.1 - 15.9 g/dL Final          Failed - Cr in normal range and within 360 days    Creatinine, Ser  Date Value Ref Range Status  03/09/2021 1.20 (H) 0.44 - 1.00 mg/dL Final          Passed - Patient is not pregnant      Passed - Valid encounter within last 12 months    Recent Outpatient Visits           3 months ago Tinea corporis   [facility] Glean Hess, MD   4 months ago Annual physical exam   [facility] Glean Hess, MD   5 months ago Cellulitis of left hand   [facility] Glean Hess, MD   8 months ago [facility] Glean Hess, MD   1 year ago Acute non-recurrent maxillary sinusitis   [facility] Glean Hess, MD       Future Appointments             Tomorrow Glean Hess, MD [facility], [place]

## 2021-07-23 ENCOUNTER — Encounter: Payer: Self-pay | Admitting: Internal Medicine

## 2021-07-23 ENCOUNTER — Ambulatory Visit
Admission: RE | Admit: 2021-07-23 | Discharge: 2021-07-23 | Disposition: A | Discharge status: Home / Self Care | Payer: Medicare HMO | Source: Ambulatory Visit | Attending: Internal Medicine | Admitting: Internal Medicine

## 2021-07-23 ENCOUNTER — Other Ambulatory Visit: Payer: Self-pay

## 2021-07-23 ENCOUNTER — Ambulatory Visit (INDEPENDENT_AMBULATORY_CARE_PROVIDER_SITE_OTHER): Payer: Medicare HMO | Admitting: Internal Medicine

## 2021-07-23 ENCOUNTER — Ambulatory Visit
Admission: RE | Admit: 2021-07-23 | Discharge: 2021-07-23 | Disposition: A | Discharge status: Home / Self Care | Payer: Medicare HMO | Attending: Internal Medicine | Admitting: Internal Medicine

## 2021-07-23 ENCOUNTER — Ambulatory Visit: Payer: Medicare HMO | Admitting: Internal Medicine

## 2021-07-23 VITALS — BP 120/74 | HR 68 | Ht 66.0 in | Wt 187.0 lb

## 2021-07-23 DIAGNOSIS — M79672 Pain in left foot: Secondary | ICD-10-CM | POA: Diagnosis not present

## 2021-07-23 DIAGNOSIS — I10 Essential (primary) hypertension: Secondary | ICD-10-CM | POA: Diagnosis not present

## 2021-07-23 DIAGNOSIS — E118 Type 2 diabetes mellitus with unspecified complications: Secondary | ICD-10-CM | POA: Diagnosis not present

## 2021-07-23 DIAGNOSIS — E785 Hyperlipidemia, unspecified: Secondary | ICD-10-CM

## 2021-07-23 DIAGNOSIS — M170 Bilateral primary osteoarthritis of knee: Secondary | ICD-10-CM | POA: Diagnosis not present

## 2021-07-23 DIAGNOSIS — K626 Ulcer of anus and rectum: Secondary | ICD-10-CM | POA: Insufficient documentation

## 2021-07-23 DIAGNOSIS — M5116 Intervertebral disc disorders with radiculopathy, lumbar region: Secondary | ICD-10-CM

## 2021-07-23 DIAGNOSIS — D649 Anemia, unspecified: Secondary | ICD-10-CM | POA: Diagnosis not present

## 2021-07-23 DIAGNOSIS — K219 Gastro-esophageal reflux disease without esophagitis: Secondary | ICD-10-CM

## 2021-07-23 DIAGNOSIS — E1169 Type 2 diabetes mellitus with other specified complication: Secondary | ICD-10-CM

## 2021-07-23 DIAGNOSIS — R7989 Other specified abnormal findings of blood chemistry: Secondary | ICD-10-CM | POA: Diagnosis not present

## 2021-07-23 MED ORDER — OMEPRAZOLE 20 MG PO CPDR: 20.0000 mg | DELAYED_RELEASE_CAPSULE | Freq: Every day | ORAL | 1 refills | Status: DC

## 2021-07-23 MED ORDER — GABAPENTIN 100 MG PO CAPS: 200.0000 mg | ORAL_CAPSULE | Freq: Two times a day (BID) | ORAL | 1 refills | Status: DC

## 2021-07-23 MED ORDER — TRAMADOL HCL 50 MG PO TABS: 50.0000 mg | ORAL_TABLET | Freq: Four times a day (QID) | ORAL | 0 refills | Status: DC | PRN

## 2021-07-23 MED ORDER — MUPIROCIN 2 % EX OINT: TOPICAL_OINTMENT | CUTANEOUS | 5 refills | Status: DC

## 2021-07-23 NOTE — Telephone Encounter (Signed)
Please review. Last office visit 03/11/2021.  KP

## 2021-07-23 NOTE — Progress Notes (Signed)
Date:  07/23/2021   Name:  Kiara Campbell   DOB:  [birth year]   MRN:  [id number]   Chief Complaint: Hypertension and Foot Pain (X 1 month,Colchicine for 6 days did not work, not injuries, throbbing at night )  Hypertension This is a chronic problem. The problem is controlled. Pertinent negatives include no chest pain, headaches, palpitations or shortness of breath. Past treatments include beta blockers, ACE inhibitors and diuretics. Hypertensive end-organ damage includes kidney disease. There is no history of CAD/MI or CVA.  Diabetes She presents for her follow-up diabetic visit. She has type 2 diabetes mellitus. Her disease course has been stable. Pertinent negatives for hypoglycemia include no dizziness or headaches. Pertinent negatives for diabetes include no chest pain, no fatigue and no weakness. Pertinent negatives for diabetic complications include no CVA. Current diabetic treatment includes oral agent (monotherapy). She is compliant with treatment all of the time. There is no change in her home blood glucose trend. Her breakfast blood glucose is taken between 8-9 am. Her breakfast blood glucose range is generally 130-140 mg/dl.  Foot pain - chronic worsening and not relieved by Colchicine.  No trauma.  Able to walk but aches constantly. Anemia - taking iron but has not had her CBC rechecked since her low Hgb in September.  She feels weak with decreased energy. GERD - she is doing well on omeprazole in place of pepcid since her EGD in August.  She was told that she needed a repeat EGD in 4 months to make sure there was no cancer.  I can not see a reason for a repeat EGD and Dr. Darene Lamer is no longer with the practice.   Lab Results  Component Value Date   NA 138 03/09/2021   K 4.1 03/09/2021   CO2 29 03/09/2021   GLUCOSE 120 (H) 03/09/2021   BUN 33 (H) 03/09/2021   CREATININE 1.20 (H) 03/09/2021   CALCIUM 10.4 (H) 03/09/2021   GFRNONAA 50 (L) 03/09/2021   Lab Results  Component Value  Date   CHOL 111 04/07/2020   HDL 44 04/07/2020   LDLCALC 39 04/07/2020   TRIG 171 (H) 04/07/2020   CHOLHDL 2.5 04/07/2020   Lab Results  Component Value Date   TSH 2.510 03/04/2019   Lab Results  Component Value Date   HGBA1C 6.1 (H) 03/09/2021   Lab Results  Component Value Date   WBC 8.6 03/09/2021   HGB 9.2 (L) 03/09/2021   HCT 27.6 (L) 03/09/2021   MCV 97.2 03/09/2021   PLT 332 03/09/2021   Lab Results  Component Value Date   ALT 22 03/09/2021   AST 22 03/09/2021   ALKPHOS 63 03/09/2021   BILITOT 0.7 03/09/2021   No results found for: 25OHVITD2, 25OHVITD3, VD25OH   Review of Systems  Constitutional:  Negative for fatigue and unexpected weight change.  HENT:  Negative for nosebleeds.   Eyes:  Negative for visual disturbance.  Respiratory:  Negative for cough, chest tightness, shortness of breath and wheezing.   Cardiovascular:  Negative for chest pain, palpitations and leg swelling.  Gastrointestinal:  Negative for abdominal pain, constipation and diarrhea.  Neurological:  Negative for dizziness, weakness, light-headedness and headaches.   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  Tobacco use disorder, severe, in early remission 08/09/2017   Obesity (BMI 30-39.9) 07/17/2017   GERD (gastroesophageal reflux disease) 07/12/2017   Primary osteoarthritis of both knees 08/01/2016   Type II diabetes mellitus with complication ([place]) 91/63/8466   Fibrocystic breast disease 12/03/2014   Colon polyp 12/03/2014   Diverticulitis of colon 12/03/2014   Essential (primary) hypertension 12/03/2014   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   OP (osteoporosis) 12/03/2014   Hyperlipidemia associated with type 2 diabetes mellitus ([place]) 12/03/2014    Allergies  Allergen  Reactions   Glipizide Nausea Only    Past Surgical History:  Procedure Laterality Date   ABDOMINAL HYSTERECTOMY     partial   ABDOMINAL HYSTERECTOMY     APPENDECTOMY     BREAST CYST ASPIRATION     neg not sure which side   COLONOSCOPY WITH PROPOFOL N/A 02/03/2021   Procedure: COLONOSCOPY WITH PROPOFOL;  Surgeon: Virgel Manifold, MD;  Location: [facility] ENDOSCOPY;  Service: Endoscopy;  Laterality: N/A;   ESOPHAGOGASTRODUODENOSCOPY N/A 02/03/2021   Procedure: ESOPHAGOGASTRODUODENOSCOPY (EGD);  Surgeon: Virgel Manifold, MD;  Location: [facility]COPY;  Service: Endoscopy;  Laterality: N/A;   JOINT REPLACEMENT     RECTAL PROLAPSE REPAIR     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   TONSILLECTOMY     VARICOSE VEIN SURGERY      Social History   Tobacco Use   Smoking status: Former    Packs/day: 0.50    Years: 30.00    Pack years: 15.00    Types: Cigarettes    Quit date: 08/04/2018    Years since quitting: 2.9   Smokeless tobacco: Never  Vaping Use   Vaping Use: Never used  Substance Use Topics   Alcohol use: Yes    Comment: occasional   Drug use: No     Medication list has been reviewed and updated.  Current Meds  Medication Sig   Accu-Chek Softclix Lancets lancets Use as instructed   Alcohol Swabs (B-D SINGLE USE SWABS REGULAR) PADS USE DAILY.   allopurinol (ZYLOPRIM) 300 MG tablet TAKE 1 TABLET EVERY DAY   azelastine (ASTELIN) 0.1 % nasal spray Place 1 spray into both nostrils 2 (two) times daily. Use in each nostril as directed   Calcium Carb-Ergocalciferol 500-200 MG-UNIT TABS Take by mouth.   celecoxib (CELEBREX) 200 MG capsule Take 1 capsule (200 mg total) by mouth 2 (two) times daily.   colchicine (COLCRYS) 0.6 MG tablet Take 1 tablet (0.6 mg total) by mouth 2 (two) times daily.   Ferrous Sulfate (IRON PO) Take by mouth.   fexofenadine (ALLEGRA) 60 MG tablet Take 60 mg by mouth 2 (two) times daily.   FIBER PO Take by mouth in the morning and at bedtime.    fluticasone (FLONASE) 50 MCG/ACT nasal spray    gabapentin (NEURONTIN) 100 MG capsule TAKE 2 CAPSULES TWICE DAILY   glucose blood (TRUE METRIX BLOOD GLUCOSE TEST) test strip TEST EVERY DAY   Lancet Devices (ACCU-CHEK SOFTCLIX) lancets 1 each by Other route daily. Use as instructed   Lancets Misc. (ACCU-CHEK SOFTCLIX LANCET DEV) KIT 1 each by Does not apply route daily.   Lancets Misc. MISC See admin instructions.   losartan-hydrochlorothiazide (HYZAAR) 100-25 MG tablet Take 1 tablet by mouth daily.   metFORMIN (GLUCOPHAGE-XR) 500 MG 24 hr tablet Take 1 tablet (500 mg total) by mouth daily with breakfast.   metoprolol succinate (TOPROL-XL) 50 MG 24 hr tablet Take 3 tablets (150 mg total) by mouth daily.  Take with or immediately following a meal.   mupirocin ointment (BACTROBAN) 2 % PLACE 1 APPLICATION INTO THE NOSE 2 TIMES A DAILY.   omeprazole (PRILOSEC) 20 MG capsule Take 1 capsule (20 mg total) by mouth daily.   simvastatin (ZOCOR) 40 MG tablet Take 1 tablet (40 mg total) by mouth at bedtime.   traMADol (ULTRAM) 50 MG tablet TAKE 1 TABLET BY MOUTH EVERY 6 HOURS AS NEEDED   vitamin C (ASCORBIC ACID) 500 MG tablet Take 500 mg by mouth daily.    PHQ 2/9 Scores 07/23/2021 03/31/2021 03/09/2021 02/01/2021  PHQ - 2 Score 2 2 2 2   PHQ- 9 Score 6 6 2 8   Exception Documentation - - - -    GAD 7 : Generalized Anxiety Score 07/23/2021 03/31/2021 03/09/2021 02/01/2021  Nervous, Anxious, on Edge 2 2 0 0  Control/stop worrying 2 1 1 1   Worry too much - different things 2 0 1 1  Trouble relaxing 1 1 1 1   Restless 1 1 1 1   Easily annoyed or irritable 1 1 0 1  Afraid - awful might happen 0 0 0 0  Total GAD 7 Score 9 6 4 5   Anxiety Difficulty - Not difficult at all - -    BP Readings from Last 3 Encounters:  07/23/21 120/74  04/26/21 (!) 157/82  03/31/21 140/80    Physical Exam Vitals and nursing note reviewed.  Constitutional:      General: She is not in acute distress.    Appearance: She is  well-developed.  HENT:     Head: Normocephalic and atraumatic.  Cardiovascular:     Rate and Rhythm: Normal rate and regular rhythm.     Heart sounds: No murmur heard. Pulmonary:     Effort: Pulmonary effort is normal. No respiratory distress.     Breath sounds: No wheezing or rhonchi.  Musculoskeletal:     Cervical back: Normal range of motion.     Right lower leg: No edema.     Left lower leg: No edema.     Left foot: Tenderness and bony tenderness (mid foot) present. No swelling.  Lymphadenopathy:     Cervical: No cervical adenopathy.  Skin:    General: Skin is warm and dry.     Findings: No rash.  Neurological:     Mental Status: She is alert and oriented to person, place, and time.  Psychiatric:        Mood and Affect: Mood normal.        Behavior: Behavior normal.    Wt Readings from Last 3 Encounters:  07/23/21 187 lb (84.8 kg)  04/26/21 179 lb (81.2 kg)  03/31/21 180 lb 6.4 oz (81.8 kg)    BP 120/74    Pulse 68    Ht 5\' 6"  (1.676 m)    Wt 187 lb (84.8 kg)    LMP 09/27/1995    SpO2 97%    BMI 30.18 kg/m   Assessment and Plan: 1. Essential (primary) hypertension Clinically stable exam with well controlled BP. Tolerating medications without side effects at this time. Pt to continue current regimen and low sodium diet; benefits of regular exercise as able discussed.  2. Type II diabetes mellitus with complication (HCC) BS has been stable; no blurred vision, ulcerations, low blood sugar episodes. Continue metformin 500 mg daily. Schedule DM eye exam. - Hemoglobin A1c - Microalbumin / creatinine urine ratio - Comprehensive metabolic panel  3. Hyperlipidemia associated with type 2 diabetes mellitus ([place])  Tolerating statin medication without side effects at this time LDL is at goal of < 70 on current dose Continue same therapy without change at this time.  4. Left foot pain Does not appear to be gout and has not responded to colchicine. Will get imaging and  check UA.  Continue allopurinol 300 mg. - Uric acid - DG Foot Complete Left  5. Anemia, unspecified type Check labs for improvement - Vitamin B12 - Iron, TIBC and Ferritin Panel - CBC with Differential/Platelet  6. Lumbar disc herniation with radiculopathy Stable on tramadol bid and gabapentin. - traMADol (ULTRAM) 50 MG tablet; Take 1 tablet (50 mg total) by mouth every 6 (six) hours as needed.  Dispense: 60 tablet; Refill: 0 - gabapentin (NEURONTIN) 100 MG capsule; Take 2 capsules (200 mg total) by mouth 2 (two) times daily.  Dispense: 360 capsule; Refill: 1  7. Primary osteoarthritis of both knees - traMADol (ULTRAM) 50 MG tablet; Take 1 tablet (50 mg total) by mouth every 6 (six) hours as needed.  Dispense: 60 tablet; Refill: 0  8. Gastroesophageal reflux disease without esophagitis Symptoms well controlled on daily PPI No red flag signs such as weight loss, n/v, melena Will continue omeprazole.  Will contact [facility] regarding follow up. - omeprazole (PRILOSEC) 20 MG capsule; Take 1 capsule (20 mg total) by mouth daily.  Dispense: 90 capsule; Refill: 1  9. Anorectal ulcer Initially thought to be malignant but second opinion is not so sure. She has a biopsy of the lesion planned next month.   Partially dictated using Editor, commissioning. Any errors are unintentional.  Halina Maidens, MD [facility]  07/23/2021

## 2021-07-25 ENCOUNTER — Other Ambulatory Visit: Payer: Self-pay | Admitting: Internal Medicine

## 2021-07-25 LAB — CBC WITH DIFFERENTIAL/PLATELET
Basophils Absolute: 0.1 10*3/uL (ref 0.0–0.2)
Basos: 1 %
EOS (ABSOLUTE): 0.4 10*3/uL (ref 0.0–0.4)
Eos: 4 %
Hematocrit: 28.6 % — ABNORMAL LOW (ref 34.0–46.6)
Hemoglobin: 9.7 g/dL — ABNORMAL LOW (ref 11.1–15.9)
Immature Grans (Abs): 0 10*3/uL (ref 0.0–0.1)
Immature Granulocytes: 0 %
Lymphocytes Absolute: 3.5 10*3/uL — ABNORMAL HIGH (ref 0.7–3.1)
Lymphs: 30 %
MCH: 32.7 pg (ref 26.6–33.0)
MCHC: 33.9 g/dL (ref 31.5–35.7)
MCV: 96 fL (ref 79–97)
Monocytes Absolute: 2 10*3/uL — ABNORMAL HIGH (ref 0.1–0.9)
Monocytes: 17 %
NRBC: 1 % — ABNORMAL HIGH (ref 0–0)
Neutrophils Absolute: 5.6 10*3/uL (ref 1.4–7.0)
Neutrophils: 48 %
Platelets: 323 10*3/uL (ref 150–450)
RBC: 2.97 x10E6/uL — ABNORMAL LOW (ref 3.77–5.28)
RDW: 20.2 % — ABNORMAL HIGH (ref 11.7–15.4)
WBC: 11.6 10*3/uL — ABNORMAL HIGH (ref 3.4–10.8)

## 2021-07-25 LAB — IRON,TIBC AND FERRITIN PANEL
Ferritin: 388 ng/mL — ABNORMAL HIGH (ref 15–150)
Iron Saturation: 27 % (ref 15–55)
Iron: 89 ug/dL (ref 27–139)
Total Iron Binding Capacity: 326 ug/dL (ref 250–450)
UIBC: 237 ug/dL (ref 118–369)

## 2021-07-25 LAB — COMPREHENSIVE METABOLIC PANEL
ALT: 20 IU/L (ref 0–32)
AST: 18 IU/L (ref 0–40)
Albumin/Globulin Ratio: 1.9 (ref 1.2–2.2)
Albumin: 4.9 g/dL — ABNORMAL HIGH (ref 3.8–4.8)
Alkaline Phosphatase: 85 IU/L (ref 44–121)
BUN/Creatinine Ratio: 22 (ref 12–28)
BUN: 30 mg/dL — ABNORMAL HIGH (ref 8–27)
Bilirubin Total: 0.3 mg/dL (ref 0.0–1.2)
CO2: 26 mmol/L (ref 20–29)
Calcium: 10.1 mg/dL (ref 8.7–10.3)
Chloride: 103 mmol/L (ref 96–106)
Creatinine, Ser: 1.38 mg/dL — ABNORMAL HIGH (ref 0.57–1.00)
Globulin, Total: 2.6 g/dL (ref 1.5–4.5)
Glucose: 110 mg/dL — ABNORMAL HIGH (ref 70–99)
Potassium: 4.5 mmol/L (ref 3.5–5.2)
Sodium: 142 mmol/L (ref 134–144)
Total Protein: 7.5 g/dL (ref 6.0–8.5)
eGFR: 42 mL/min/{1.73_m2} — ABNORMAL LOW (ref >59)

## 2021-07-25 LAB — URIC ACID: Uric Acid: 5.6 mg/dL (ref 3.0–7.2)

## 2021-07-25 LAB — HEMOGLOBIN A1C
Est. average glucose Bld gHb Est-mCnc: 140 mg/dL
Hgb A1c MFr Bld: 6.5 % — ABNORMAL HIGH (ref 4.8–5.6)

## 2021-07-25 LAB — MICROALBUMIN / CREATININE URINE RATIO
Creatinine, Urine: 65.4 mg/dL
Microalb/Creat Ratio: 31 mg/g creat — ABNORMAL HIGH (ref 0–29)
Microalbumin, Urine: 20.4 ug/mL

## 2021-07-25 LAB — VITAMIN B12: Vitamin B-12: 1432 pg/mL — ABNORMAL HIGH (ref 232–1245)

## 2021-07-26 ENCOUNTER — Other Ambulatory Visit: Payer: Self-pay

## 2021-08-11 ENCOUNTER — Encounter (HOSPITAL_BASED_OUTPATIENT_CLINIC_OR_DEPARTMENT_OTHER): Payer: Self-pay | Admitting: Surgery

## 2021-08-11 ENCOUNTER — Other Ambulatory Visit: Payer: Self-pay

## 2021-08-11 ENCOUNTER — Encounter: Payer: Self-pay | Admitting: Family Medicine

## 2021-08-11 ENCOUNTER — Ambulatory Visit (INDEPENDENT_AMBULATORY_CARE_PROVIDER_SITE_OTHER): Payer: Medicare HMO | Admitting: Family Medicine

## 2021-08-11 VITALS — BP 152/70 | HR 64 | Ht 66.0 in | Wt 186.0 lb

## 2021-08-11 DIAGNOSIS — S92352A Displaced fracture of fifth metatarsal bone, left foot, initial encounter for closed fracture: Secondary | ICD-10-CM | POA: Insufficient documentation

## 2021-08-11 MED ORDER — MUPIROCIN 2 % EX OINT: TOPICAL_OINTMENT | CUTANEOUS | 5 refills | Status: AC

## 2021-08-11 MED ORDER — VITAMIN D (ERGOCALCIFEROL) 1.25 MG (50000 UNIT) PO CAPS: 50000.0000 [IU] | ORAL_CAPSULE | ORAL | 0 refills | Status: AC

## 2021-08-11 NOTE — Progress Notes (Signed)
Spoke w/ via phone for pre-op interview: patient  Lab needs dos: EKG Lab results: CMP and CBC 07/23/21 COVID test: patient states asymptomatic no test needed. Arrive at 12pm (noon) NPO after MN except clear liquids.Clear liquids from MN until 11:00 am Med rec completed. Medications to take morning of surgery: Tramadol PRN, Astelin, Allegra, Neurontin, Prilosec and Metoprolol. Hold Celebrex and vitamin C 5 days prior to sx. Diabetic medication: none morning of sx Patient instructed no nail polish to be worn day of surgery. Patient instructed to bring photo id and insurance card day of surgery. Patient aware to have Driver (ride ) / caregiver for 24 hours after surgery. Patient Special Instructions: NA Pre-Op special Istructions:NA Patient verbalized understanding of instructions that were given at this phone interview. Patient denies shortness of breath, chest pain, fever, cough at this phone interview.

## 2021-08-11 NOTE — Assessment & Plan Note (Signed)
Kiara Campbell presents for initial evaluation left forefoot/midfoot lateral pain ongoing for roughly 2 months, atraumatic in onset.  She does give a history of dropping a can when a grocery bag broke onto the dorsum of her left foot which did not result in significant pain thereafter.  Upon further questioning she also gives history of a slight fall at home describing an inversion injury, at which time she noted significant discomfort that is lasted until today.  She has had x-rays ordered by her PCP Dr. Army Melia which reveals a subacute left fifth metatarsal displaced fracture with healing.  She states that she has pain throughout the day, worse with weightbearing, noted at night while in bed, treatments to date have involved her regular Celebrex and tramadol with limited response.  Examination reveals no overt deformation, overlying skin looks benign, she has full ankle range of motion with pain during plantarflexion, tenderness maximally just proximal to the fifth MTP, this coincides with fracture, secondary pain to the base of the fifth metatarsal, no crepitus.  Findings are most consistent with pain stemming from underlying subacute fifth metatarsal angulated displaced fracture.  I discussed both surgical and nonsurgical treatment strategies and she has elected to proceed in a nonsurgical manner with transition to postop shoe to be worn throughout the day while weightbearing, home-based exercises, Rx 8-week course of weekly vitamin D, and continue calcium daily dosing in addition to activity.  Return for follow-up in 6 weeks with new x-rays prior to that visit for reevaluation, if suboptimal progress, can consider formal physical therapy.

## 2021-08-11 NOTE — Patient Instructions (Signed)
-   Use postop shoe at all times during your wakeful hours while on your feet (okay to remove for bathing, bedtime) - Start home exercise this weekend and focus on gradual advance, follow instructions - Start weekly Rx vitamin D (continue current medications) - Return for follow-up in 6 weeks, obtain x-rays roughly 45 minutes prior to visit so that we can review them at your return - Contact us for any questions between now and then

## 2021-08-11 NOTE — Progress Notes (Signed)
°  ° °  Primary Care / Sports Medicine Office Visit  Patient Information:  Patient ID: Kiara Campbell, female DOB: [birth year] Age: 66 y.o. MRN: [id number]   Kiara Campbell is a pleasant 66 y.o. female presenting with the following:  Chief Complaint  Patient presents with   New Patient (Initial Visit)   Foot Pain    Left; X-ray 07/23/21 showed old fifth metatarsal fracture; dropped a can on foot x2 months ago; describes as throbbing, constant, achy; treatments include tramadol, Celebrex, elevating, compression with some relief; 5/10 pain    Vitals:   08/11/21 1058  BP: (!) 152/70  Pulse: 64  SpO2: 98%   Vitals:   08/11/21 1058  Weight: 186 lb (84.4 kg)  Height: 5\' 6"  (1.676 m)   Body mass index is 30.02 kg/m.  DG Foot Complete Left  Result Date: 07/24/2021 CLINICAL DATA:  Left foot pain EXAM: LEFT FOOT - COMPLETE 3+ VIEW COMPARISON:  None. FINDINGS: No acute fracture or malalignment. Old distal fifth metatarsal fracture. Large plantar calcaneal spur. Mild degenerative change at the first MTP joint IMPRESSION: 1. No acute osseous abnormality. 2. Old fifth metatarsal fracture Electronically Signed   By: Donavan Foil M.D.   On: 07/24/2021 20:03     Independent interpretation of notes and tests performed by another provider:   Independent interpretation of left foot x-ray dated 07/24/2021 reveals angulated subacute mid distal fracture of the fifth metatarsal, evidence of callus, she does have scattered degenerative changes with focality at the first MTP.  Procedures performed:   None  Pertinent History, Exam, Impression, and Recommendations:   No problem-specific Assessment & Plan notes found for this encounter.    Orders & Medications Meds ordered this encounter  Medications   Vitamin D, Ergocalciferol, (DRISDOL) 1.25 MG (50000 UNIT) CAPS capsule    Sig: Take 1 capsule (50,000 Units total) by mouth every 7 (seven) days. Take for 8 total doses(weeks)    Dispense:  8 capsule     Refill:  0   Orders Placed This Encounter  Procedures   DG Foot Complete Left     Return in about 6 weeks (around 09/22/2021).     Montel Culver, MD   Primary Care Sports Medicine [place]

## 2021-08-17 ENCOUNTER — Ambulatory Visit: Payer: Self-pay | Admitting: Surgery

## 2021-08-18 ENCOUNTER — Ambulatory Visit (HOSPITAL_BASED_OUTPATIENT_CLINIC_OR_DEPARTMENT_OTHER)
Admission: RE | Admit: 2021-08-18 | Discharge: 2021-08-18 | Disposition: A | Discharge status: Home / Self Care | Payer: Medicare HMO | Attending: Surgery | Admitting: Surgery

## 2021-08-18 ENCOUNTER — Encounter (HOSPITAL_BASED_OUTPATIENT_CLINIC_OR_DEPARTMENT_OTHER): Payer: Self-pay | Admitting: Surgery

## 2021-08-18 ENCOUNTER — Other Ambulatory Visit: Payer: Self-pay

## 2021-08-18 ENCOUNTER — Ambulatory Visit (HOSPITAL_BASED_OUTPATIENT_CLINIC_OR_DEPARTMENT_OTHER): Payer: Medicare HMO | Admitting: Certified Registered Nurse Anesthetist

## 2021-08-18 ENCOUNTER — Encounter (HOSPITAL_BASED_OUTPATIENT_CLINIC_OR_DEPARTMENT_OTHER)
Admission: RE | Disposition: A | Discharge status: Home / Self Care | Payer: Self-pay | Source: Home / Self Care | Attending: Surgery

## 2021-08-18 DIAGNOSIS — E119 Type 2 diabetes mellitus without complications: Secondary | ICD-10-CM | POA: Insufficient documentation

## 2021-08-18 DIAGNOSIS — K626 Ulcer of anus and rectum: Secondary | ICD-10-CM

## 2021-08-18 DIAGNOSIS — Z87891 Personal history of nicotine dependence: Secondary | ICD-10-CM | POA: Insufficient documentation

## 2021-08-18 DIAGNOSIS — Z79899 Other long term (current) drug therapy: Secondary | ICD-10-CM | POA: Diagnosis not present

## 2021-08-18 DIAGNOSIS — I10 Essential (primary) hypertension: Secondary | ICD-10-CM

## 2021-08-18 DIAGNOSIS — A630 Anogenital (venereal) warts: Secondary | ICD-10-CM | POA: Insufficient documentation

## 2021-08-18 DIAGNOSIS — K620 Anal polyp: Secondary | ICD-10-CM | POA: Diagnosis not present

## 2021-08-18 DIAGNOSIS — K6282 Dysplasia of anus: Secondary | ICD-10-CM | POA: Insufficient documentation

## 2021-08-18 DIAGNOSIS — N816 Rectocele: Secondary | ICD-10-CM | POA: Insufficient documentation

## 2021-08-18 DIAGNOSIS — K219 Gastro-esophageal reflux disease without esophagitis: Secondary | ICD-10-CM | POA: Insufficient documentation

## 2021-08-18 DIAGNOSIS — Z7984 Long term (current) use of oral hypoglycemic drugs: Secondary | ICD-10-CM | POA: Insufficient documentation

## 2021-08-18 HISTORY — PX: LESION DESTRUCTION: SHX5236

## 2021-08-18 HISTORY — DX: Gout, unspecified: M10.9

## 2021-08-18 LAB — GLUCOSE, CAPILLARY
Glucose-Capillary: 114 mg/dL — ABNORMAL HIGH (ref 70–99)
Glucose-Capillary: 125 mg/dL — ABNORMAL HIGH (ref 70–99)

## 2021-08-18 SURGERY — DESTRUCTION, LESION, ANUS
Anesthesia: Monitor Anesthesia Care | Site: Anus

## 2021-08-18 MED ORDER — OXYCODONE HCL 5 MG/5ML PO SOLN: 5.0000 mg | Freq: Once | ORAL | Status: DC | PRN

## 2021-08-18 MED ORDER — MIDAZOLAM HCL 2 MG/2ML IJ SOLN: 0.5000 mg | Freq: Once | INTRAMUSCULAR | Status: DC | PRN

## 2021-08-18 MED ORDER — ACETAMINOPHEN 500 MG PO TABS
1000.0000 mg | ORAL_TABLET | ORAL | Status: AC
  Administered 2021-08-18: 1000 mg via ORAL

## 2021-08-18 MED ORDER — ONDANSETRON HCL 4 MG/2ML IJ SOLN
INTRAMUSCULAR | Status: DC | PRN
  Administered 2021-08-18: 4 mg via INTRAVENOUS

## 2021-08-18 MED ORDER — BUPIVACAINE LIPOSOME 1.3 % IJ SUSP
INTRAMUSCULAR | Status: DC | PRN
  Administered 2021-08-18: 5 mL

## 2021-08-18 MED ORDER — LACTATED RINGERS IV SOLN: INTRAVENOUS | Status: DC

## 2021-08-18 MED ORDER — OXYCODONE HCL 5 MG PO TABS: 5.0000 mg | ORAL_TABLET | Freq: Once | ORAL | Status: DC | PRN

## 2021-08-18 MED ORDER — FENTANYL CITRATE (PF) 100 MCG/2ML IJ SOLN
INTRAMUSCULAR | Status: DC | PRN
  Administered 2021-08-18: 50 ug via INTRAVENOUS
  Administered 2021-08-18 (×2): 25 ug via INTRAVENOUS

## 2021-08-18 MED ORDER — ACETAMINOPHEN 500 MG PO TABS
ORAL_TABLET | ORAL | Status: AC
  Filled 2021-08-18: qty 2

## 2021-08-18 MED ORDER — PROMETHAZINE HCL 25 MG/ML IJ SOLN: 6.2500 mg | INTRAMUSCULAR | Status: DC | PRN

## 2021-08-18 MED ORDER — PROPOFOL 10 MG/ML IV BOLUS
INTRAVENOUS | Status: AC
  Filled 2021-08-18: qty 20

## 2021-08-18 MED ORDER — MIDAZOLAM HCL 2 MG/2ML IJ SOLN
INTRAMUSCULAR | Status: AC
  Filled 2021-08-18: qty 2

## 2021-08-18 MED ORDER — FENTANYL CITRATE (PF) 100 MCG/2ML IJ SOLN: 25.0000 ug | INTRAMUSCULAR | Status: DC | PRN

## 2021-08-18 MED ORDER — ONDANSETRON HCL 4 MG/2ML IJ SOLN
INTRAMUSCULAR | Status: AC
  Filled 2021-08-18: qty 2

## 2021-08-18 MED ORDER — MEPERIDINE HCL 25 MG/ML IJ SOLN: 6.2500 mg | INTRAMUSCULAR | Status: DC | PRN

## 2021-08-18 MED ORDER — FENTANYL CITRATE (PF) 100 MCG/2ML IJ SOLN
INTRAMUSCULAR | Status: AC
  Filled 2021-08-18: qty 2

## 2021-08-18 MED ORDER — BUPIVACAINE-EPINEPHRINE 0.25% -1:200000 IJ SOLN
INTRAMUSCULAR | Status: DC | PRN
  Administered 2021-08-18: 5 mL

## 2021-08-18 MED ORDER — 0.9 % SODIUM CHLORIDE (POUR BTL) OPTIME
TOPICAL | Status: DC | PRN
  Administered 2021-08-18: 500 mL

## 2021-08-18 MED ORDER — PROPOFOL 10 MG/ML IV BOLUS
INTRAVENOUS | Status: DC | PRN
Start: 2021-08-18 — End: 2021-08-18
  Administered 2021-08-18: 20 mg via INTRAVENOUS

## 2021-08-18 MED ORDER — PROPOFOL 500 MG/50ML IV EMUL
INTRAVENOUS | Status: AC
Start: 2021-08-18 — End: ?
  Filled 2021-08-18: qty 50

## 2021-08-18 MED ORDER — PROPOFOL 500 MG/50ML IV EMUL
INTRAVENOUS | Status: DC | PRN
  Administered 2021-08-18: 200 ug/kg/min via INTRAVENOUS

## 2021-08-18 MED ORDER — MIDAZOLAM HCL 5 MG/5ML IJ SOLN
INTRAMUSCULAR | Status: DC | PRN
  Administered 2021-08-18: 1 mg via INTRAVENOUS

## 2021-08-18 MED ORDER — BUPIVACAINE LIPOSOME 1.3 % IJ SUSP: 20.0000 mL | Freq: Once | INTRAMUSCULAR | Status: DC

## 2021-08-18 SURGICAL SUPPLY — 42 items
BLADE EXTENDED COATED 6.5IN (ELECTRODE) ×2 IMPLANT
BLADE SURG 15 STRL LF DISP TIS (BLADE) IMPLANT
BLADE SURG 15 STRL SS (BLADE)
COVER BACK TABLE 60X90IN (DRAPES) ×2 IMPLANT
COVER MAYO STAND STRL (DRAPES) ×2 IMPLANT
DRAPE LAPAROTOMY 100X72 PEDS (DRAPES) ×2 IMPLANT
DRAPE UTILITY XL STRL (DRAPES) ×2 IMPLANT
DRSG PAD ABDOMINAL 8X10 ST (GAUZE/BANDAGES/DRESSINGS) IMPLANT
ELECT REM PT RETURN 9FT ADLT (ELECTROSURGICAL) ×2
ELECTRODE REM PT RTRN 9FT ADLT (ELECTROSURGICAL) ×1 IMPLANT
GAUZE 4X4 16PLY ~~LOC~~+RFID DBL (SPONGE) ×2 IMPLANT
GAUZE SPONGE 4X4 12PLY STRL (GAUZE/BANDAGES/DRESSINGS) ×2 IMPLANT
GAUZE SPONGE 4X4 12PLY STRL LF (GAUZE/BANDAGES/DRESSINGS) ×1 IMPLANT
GLOVE SRG 8 PF TXTR STRL LF DI (GLOVE) ×1 IMPLANT
GLOVE SURG ENC MOIS LTX SZ7.5 (GLOVE) ×2 IMPLANT
GLOVE SURG UNDER POLY LF SZ8 (GLOVE) ×1
GOWN STRL REUS W/TWL XL LVL3 (GOWN DISPOSABLE) ×2 IMPLANT
KIT SIGMOIDOSCOPE (SET/KITS/TRAYS/PACK) IMPLANT
KIT TURNOVER CYSTO (KITS) ×2 IMPLANT
LIGASURE 7.4 SM JAW OPEN (ELECTROSURGICAL) IMPLANT
LOOP VESSEL MAXI BLUE (MISCELLANEOUS) IMPLANT
NEEDLE HYPO 22GX1.5 SAFETY (NEEDLE) ×2 IMPLANT
NS IRRIG 500ML POUR BTL (IV SOLUTION) ×2 IMPLANT
PACK BASIN DAY SURGERY FS (CUSTOM PROCEDURE TRAY) ×2 IMPLANT
PAD ARMBOARD 7.5X6 YLW CONV (MISCELLANEOUS) IMPLANT
PANTS MESH DISP LRG (UNDERPADS AND DIAPERS) ×1 IMPLANT
PANTS MESH DISPOSABLE L (UNDERPADS AND DIAPERS) ×1
PENCIL SMOKE EVACUATOR (MISCELLANEOUS) ×2 IMPLANT
SPONGE HEMORRHOID 8X3CM (HEMOSTASIS) IMPLANT
SPONGE SURGIFOAM ABS GEL 100 (HEMOSTASIS) IMPLANT
SPONGE SURGIFOAM ABS GEL 12-7 (HEMOSTASIS) IMPLANT
SUT CHROMIC 3 0 SH 27 (SUTURE) ×1 IMPLANT
SUT VIC AB 2-0 UR6 27 (SUTURE) ×2 IMPLANT
SUT VIC AB 3-0 SH 27 (SUTURE) ×1
SUT VIC AB 3-0 SH 27X BRD (SUTURE) ×1 IMPLANT
SYR BULB EAR ULCER 3OZ GRN STR (SYRINGE) IMPLANT
SYR CONTROL 10ML LL (SYRINGE) ×2 IMPLANT
TOWEL OR 17X26 10 PK STRL BLUE (TOWEL DISPOSABLE) ×2 IMPLANT
TRAY DSU PREP LF (CUSTOM PROCEDURE TRAY) ×2 IMPLANT
TUBE CONNECTING 12X1/4 (SUCTIONS) ×2 IMPLANT
WATER STERILE IRR 500ML POUR (IV SOLUTION) IMPLANT
YANKAUER SUCT BULB TIP NO VENT (SUCTIONS) ×2 IMPLANT

## 2021-08-18 NOTE — Discharge Instructions (Addendum)
Post Anesthesia Home Care Instructions  Activity: Get plenty of rest for the remainder of the day. A responsible adult should stay with you for 24 hours following the procedure.  For the next 24 hours, DO NOT: -Drive a car -Paediatric nurse -Drink alcoholic beverages -Take any medication unless instructed by your physician -Make any legal decisions or sign important papers.  Meals: Start with liquid foods such as gelatin or soup. Progress to regular foods as tolerated. Avoid greasy, spicy, heavy foods. If nausea and/or vomiting occur, drink only clear liquids until the nausea and/or vomiting subsides. Call your physician if vomiting continues.  Special Instructions/Symptoms: Your throat may feel dry or sore from the anesthesia or the breathing tube placed in your throat during surgery. If this causes discomfort, gargle with warm salt water. The discomfort should disappear within 24 hours.     Information for Discharge Teaching: EXPAREL (bupivacaine liposome injectable suspension)   Your surgeon gave you EXPAREL(bupivacaine) in your surgical incision to help control your pain after surgery.  EXPAREL is a local anesthetic that provides pain relief by numbing the tissue around the surgical site. EXPAREL is designed to release pain medication over time and can control pain for up to 72 hours. Depending on how you respond to EXPAREL, you may require less pain medication during your recovery.  Possible side effects: Temporary loss of sensation or ability to move in the area where bupivacaine was injected. Nausea, vomiting, constipation Rarely, numbness and tingling in your mouth or lips, lightheadedness, or anxiety may occur. Call your doctor right away if you think you may be experiencing any of these sensations, or if you have other questions regarding possible side effects.  Follow all other discharge instructions given to you by your surgeon or nurse. Eat a healthy diet and drink  plenty of water or other fluids.  If you return to the hospital for any reason within 96 hours following the administration of EXPAREL, please inform your health care providers.   ANORECTAL SURGERY: POST OP INSTRUCTIONS  DIET: Follow a light bland diet the first 24 hours after arrival home, such as soup, liquids, crackers, etc.  Be sure to include lots of fluids daily.  Avoid fast food or heavy meals as your are more likely to get nauseated.  Eat a low fat diet the next few days after surgery.   Some bleeding with bowel movements is expected for the first couple of days but this should stop in between bowel movements  Take your usually prescribed home medications unless otherwise directed.  PAIN CONTROL: It is helpful to take an over-the-counter pain medication regularly for the first few days/weeks.  Choose from the following that works best for you: Ibuprofen (Advil, etc) Three 200mg  tabs every 6 hours as needed. Acetaminophen (Tylenol, etc) 500-650mg  every 6 hours as needed NOTE: You may take both of these medications together - most patients find it most helpful when alternating between the two (i.e. Ibuprofen at 6am, tylenol at 9am, ibuprofen at 12pm ..Marland Kitchen) A  prescription for pain medication may have been prescribed for you at discharge.  Take your pain medication as prescribed.  If you are having problems/concerns with the prescription medicine, please call us for further advice.  Avoid getting constipated.  Between the surgery and the pain medications, it is common to experience some constipation.  Increasing fluid intake (64oz of water per day) and taking a fiber supplement (such as Metamucil, Citrucel, FiberCon) 1-2 times a day regularly will usually help prevent  this problem from occurring.  Take Miralax (over the counter) 1-2x/day while taking a narcotic pain medication. If no bowel movement after 48hours, you may additionally take a laxative like a bottle of Milk of Magnesia which can  be purchased over the counter. Avoid enemas if possible as these are often painful.   Watch out for diarrhea.  If you have many loose bowel movements, simplify your diet to bland foods.  Stop any stool softeners and decrease your fiber supplement. If this worsens or does not improve, please call us.  Wash / shower every day.  If you were discharged with a dressing, you may remove this the day after your surgery. You may shower normally, getting soap/water on your wound, particularly after bowel movements.  Soaking in a warm bath filled a couple inches ("Sitz bath") is a great way to clean the area after a bowel movement and many patients find it is a way to soothe the area.  ACTIVITIES as tolerated:   You may resume regular (light) daily activities beginning the next day--such as daily self-care, walking, climbing stairs--gradually increasing activities as tolerated.  If you can walk 30 minutes without difficulty, it is safe to try more intense activity such as jogging, treadmill, bicycling, low-impact aerobics, etc. Refrain from any heavy lifting or straining for the first 2 weeks after your procedure, particularly if your surgery was for hemorrhoids. Avoid activities that make your pain worse You may drive when you are no longer taking prescription pain medication, you can comfortably wear a seatbelt, and you can safely maneuver your car and apply brakes.  FOLLOW UP in our office Please call [facility] at [phone] to set up an appointment to see your surgeon in the office for a follow-up appointment approximately 2 weeks after your surgery. Make sure that you call for this appointment the day you arrive home to insure a convenient appointment time.  9. If you have disability or family leave forms that need to be completed, you may have them completed by your primary care physician's office; for return to work instructions, please ask our office staff and they will be happy to assist you in  obtaining this documentation   When to call us [phone]: Poor pain control Reactions / problems with new medications (rash/itching, etc)  Fever over 101.5 F (38.5 C) Inability to urinate Nausea/vomiting Worsening swelling or bruising Continued bleeding from incision. Increased pain, redness, or drainage from the incision  The clinic staff is available to answer your questions during regular business hours (8:30am-5pm).  Please dont hesitate to call and ask to speak to one of our nurses for clinical concerns.   A surgeon from [facility] is always on call at the hospitals   If you have a medical emergency, go to the nearest emergency room or call 911.   [facility] [address], [place], [place], [place]  [address] MAIN: [phone] FAX: [phone] [email or web address]

## 2021-08-18 NOTE — Transfer of Care (Signed)
Immediate Anesthesia Transfer of Care Note  Patient: Kiara Campbell  Procedure(s) Performed: BIOPSY OF ANORECTAL ULCER (Anus)  Patient Location: PACU  Anesthesia Type:MAC  Level of Consciousness: awake, alert , oriented and patient cooperative  Airway & Oxygen Therapy: Patient Spontanous Breathing  Post-op Assessment: Report given to RN and Post -op Vital signs reviewed and stable  Post vital signs: Reviewed and stable  Last Vitals:  Vitals Value Taken Time  BP 128/56 08/18/21 1415  Temp 36.4 C 08/18/21 1412  Pulse 68 08/18/21 1420  Resp 17 08/18/21 1420  SpO2 96 % 08/18/21 1420  Vitals shown include unvalidated device data.  Last Pain:  Vitals:   08/18/21 1216  TempSrc: Oral  PainSc: 6       Patients Stated Pain Goal: 6 (46/80/32 1224)  Complications: No notable events documented.

## 2021-08-18 NOTE — Op Note (Signed)
08/18/2021  2:10 PM  PATIENT:  Kiara Campbell  66 y.o. female  Patient Care Team: Glean Hess, MD as PCP - General (Internal Medicine) [facility] as Consulting Physician Burnis Kingfisher, MD as Consulting Physician (Orthopedic Surgery) Myna Hidalgo (Optometry) Ree Edman, MD (Dermatology)  PRE-OPERATIVE DIAGNOSIS: Anal canal ulcer and polyp  POST-OPERATIVE DIAGNOSIS:  Same  PROCEDURE:   Excision of anal canal polyp and ulcer biopsy Anorectal exam under anesthesia  SURGEON:  Surgeon(s): Ileana Roup, MD  ASSISTANT: OR staff   ANESTHESIA:   local and MAC  SPECIMEN:  Left anterior anal canal polyp and associated ulceration  DISPOSITION OF SPECIMEN:  PATHOLOGY  COUNTS:  Sponge, needle, and instrument counts were reported correct x2 at conclusion.  EBL: 1 mL  PLAN OF CARE: Discharge to home after PACU  PATIENT DISPOSITION:  PACU - hemodynamically stable.  OR FINDINGS: Anteriorly based epithelialized ulcer that is semicircumferential involving the entire anterior half of the anal canal involving the dentate line.  This appears to be an area of prolapse - most likely rectocele; the margins are not heaped up.  They are relatively flat at this time.  There is an approximate 5 x 5 mm polyp extending from the left anterior margin of this.  She had previously had this identified on a colonoscopy as well.  We went ahead and excised this and included some of the epithelialized portion of this for pathology.  We intentionally stayed off of the midline so as to avoid creating any sort of rectovaginal fistula.  The biopsy site was approximated with 3-0 chromic suture.  DESCRIPTION: The patient was identified in the preoperative holding area and taken to the OR where she was placed on the operating room table. SCDs were placed.  MAC anesthesia was induced without difficulty. The patient was then positioned in high lithotomy with Allen stirrups. Pressure points were  then evaluated and padded.  She was then prepped and draped in usual sterile fashion.  A surgical timeout was performed indicating the correct patient, procedure, and positioning.  A perianal block was performed using a dilute mixture of 0.25% Marcaine with epinephrine and Exparel.   After ascertaining that an appropriate level of anesthesia had been achieved, a well lubricated digital rectal exam was performed. Significantly decreased tone, patulous anus.  Thinning of the perineal body.  Evident rectocele.  A Hill-Ferguson anoscope was into the anal canal and circumferential inspection demonstrated This demonstrated normal appearing distal rectum; anal canal with ulceration that is epithelized, hemicircumferentially. There is an apparent left anterior polypoid extension from within this.  This is lateral to the posterior extent of the vaginal extension.  We therefore able to elevate this with a DeBakey forcep and fully excised including some of the ulcer with it.  This was passed off the specimen.  The margins of this large epithelialized ulcer are not heaped up or overtly suspicious for any sort of squamous cell carcinoma.  Regardless, this polypoid tissue excised including some of the epithelialized ulceration should serve as adequate sample for the pathologist to further evaluate.  The biopsy/polyp excision site was then closed using interrupted 3-0 chromic suture.  Hemostasis was verified.  All counts were reported correct.  A dressing consisting of 4 x 4's/ABD/mesh underwear was placed.  She is taken out of lithotomy, awakened from anesthesia, extubated, and transported to PACU in satisfactory condition.  DISPOSITION: PACU in satisfactory condition.

## 2021-08-18 NOTE — H&P (Signed)
CC: Here today for surgery  HPI: Kiara Campbell Campbell is an 66 y.o. female with history of HTN, DM, GERD, whom is seen in the office today after initially seeing Kiara Campbell Campbell 2 months ago. She had delayed scheduling this appointment initially.  Colonoscopy by Dr. Bonna Campbell 02/03/21 - multiple small polyps removed, diverticulosis, tortuous colon, 4 mm non-bleeding polyp at anus   She reports that she is been aware of the spot since August when her colonoscopy was Campbell but was otherwise unaware. She has had no symptoms related to this including any sort of anorectal pain, bleeding, or discharge. She does report fecal incontinence where she will have stool staining 2-3 times per day generally controlled with a pad but clearly interferes with her quality of life. She has altered her activities out of fear of accidents. She also reports urinary incontinence with leakage of urine when coughing or sneezing. She has never seen a physical therapist for this issue. She denies any prior surgeries for these issues.  She does report a history of a transabdominal via a Pfannenstiel approach type procedure at [facility] approximately 20 years ago where she reports she had surgery for rectal prolapse and was told that titanium screws were used to pull the rectum back up and fix it to her sacral bone.  She specifically denies any prior hemorrhoid surgeries, anorectal surgeries. She does report that she has a history of a episiotomy with the birth of one of her children. She reports that she had a significant tear at that time as well.  PMH: HTN, HLD, GERD  PSH: Trans-abdominal rectal prolapse surgery ~20 yrs ago at [facility]  FHx: Denies any known family history of colorectal, breast, endometrial or ovarian cancer  Social Hx: Denies use of tobacco/illicit drug. Social EtOH use. Retired but full time Geophysicist/field seismologist  Past Medical History:  Diagnosis Date   Calcium blood increased 12/23/2013   Diabetes mellitus without complication  ([place])    Gout    Hyperlipidemia    Hypertension    Osteopenia     Past Surgical History:  Procedure Laterality Date   ABDOMINAL HYSTERECTOMY     partial   ABDOMINAL HYSTERECTOMY     APPENDECTOMY     BREAST CYST ASPIRATION     neg not sure which side   COLONOSCOPY WITH PROPOFOL N/A 02/03/2021   Procedure: COLONOSCOPY WITH PROPOFOL;  Surgeon: Kiara Campbell Manifold, MD;  Location: [facility] ENDOSCOPY;  Service: Endoscopy;  Laterality: N/A;   ESOPHAGOGASTRODUODENOSCOPY N/A 02/03/2021   Procedure: ESOPHAGOGASTRODUODENOSCOPY (EGD);  Surgeon: Kiara Campbell Manifold, MD;  Location: [facility] ENDOSCOPY;  Service: Endoscopy;  Laterality: N/A;   JOINT REPLACEMENT     RECTAL PROLAPSE REPAIR     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   TONSILLECTOMY     VARICOSE VEIN SURGERY      Family History  Problem Relation Age of Onset   CAD Father        died age 63   COPD Mother    Diabetes Maternal Grandmother    Breast cancer Neg Hx     Social:  reports that she quit smoking about 3 years ago. Her smoking use included cigarettes. She has a 15.00 pack-year smoking history. She has never used smokeless tobacco. She reports current alcohol use. She reports that she does not use drugs.  Allergies:  Allergies  Allergen Reactions   Glipizide Nausea Only    Medications: I have reviewed the patient's current medications.  No results found for this or any  previous visit (from the past 48 hour(s)).  No results found.  ROS - all of the below systems have been reviewed with the patient and positives are indicated with bold text General: chills, fever or night sweats Eyes: blurry vision or double vision ENT: epistaxis or sore throat Allergy/Immunology: itchy/watery eyes or nasal congestion Hematologic/Lymphatic: bleeding problems, blood clots or swollen lymph nodes Endocrine: temperature intolerance or unexpected weight changes Breast: new or changing breast lumps or nipple discharge Resp: cough, shortness  of breath, or wheezing CV: chest pain or dyspnea on exertion GI: as per HPI GU: dysuria, trouble voiding, or hematuria MSK: joint pain or joint stiffness Neuro: TIA or stroke symptoms Derm: pruritus and skin lesion changes Psych: anxiety and depression  PE Height 5\' 6"  (1.676 m), weight 81.6 kg, last menstrual period 09/27/1995. Constitutional: NAD; conversant; wearing mask Eyes: Moist conjunctiva; no lid lag; anicteric Lungs: Normal respiratory effort CV: RRR MSK: Normal range of motion of extremities Psychiatric: Appropriate affect; alert and oriented x3  No results found for this or any previous visit (from the past 48 hour(s)).  No results found.  A/P: Kiara Campbell Campbell is an 66 y.o. female with hx of HTN, HLD, GERD here for evaluation of anterior anorectal punched out area with associated heaped up ulceration proximally   -Referral to pelvic floor clinic at [facility] for pelvic floor PT/biofeedback for her fecal and urinary incontinence. She did not want to pursue any sort of physical therapy treatment in [place] due to some issues she cites with traffic. She has requested a referral to [facility] for that reason.  -As for the anorectal findings, this does not have a classic appearance of a squamous cell carcinoma and additionally, she has no symptoms related to this including pain or bleeding. I think it would be worthwhile to pursue at least a tissue biopsy to confirm there is no evident malignant changes particularly along the proximal border where there is some heaped up granulation. 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  -The anatomy and physiology of the anal canal was discussed with the patient with associated pictures. The pathophysiology of anal masses was discussed at length with associated pictures and illustrations. -We have reviewed options going forward  including our recommendation for tissue biopsy for further evaluation of this anteriorly based lesion. -The planned procedure, material risks (including, but not limited to, pain, bleeding, infection, scarring, damage to anal sphincter, worsening incontinence of gas and/or stool, need for additional procedures, recurrence, failure to diagnose, pneumonia, heart attack, stroke, death) benefits and alternatives to surgery were discussed at length. I have reviewed the goal of this procedure is to obtain further information to confirm whether or not any sort of malignancy could be present. The patient's questions were answered to her satisfaction, she voiced understanding and elected to proceed with surgery. Additionally, we discussed typical postoperative expectations and the recovery process.    Nadeen Landau, MD [facility], [facility]

## 2021-08-18 NOTE — Anesthesia Postprocedure Evaluation (Signed)
Anesthesia Post Note  Patient: Kiara Campbell  Procedure(s) Performed: BIOPSY OF ANORECTAL ULCER (Anus)     Patient location during evaluation: PACU Anesthesia Type: MAC Level of consciousness: awake and alert, patient cooperative and oriented Pain management: pain level controlled Vital Signs Assessment: post-procedure vital signs reviewed and stable Respiratory status: spontaneous breathing, nonlabored ventilation and respiratory function stable Cardiovascular status: stable and blood pressure returned to baseline Postop Assessment: no apparent nausea or vomiting Anesthetic complications: no   No notable events documented.  Last Vitals:  Vitals:   08/18/21 1216 08/18/21 1412  BP: (!) 154/82   Pulse: 66 66  Resp: 17 19  Temp: 36.5 C 36.4 C  SpO2: 100% 94%    Last Pain:  Vitals:   08/18/21 1216  TempSrc: Oral  PainSc: 6                  Zyheir Daft,E. Renley Gutman

## 2021-08-18 NOTE — Anesthesia Preprocedure Evaluation (Addendum)
Anesthesia Evaluation  Patient identified by MRN, date of birth, ID band Patient awake    Reviewed: Allergy & Precautions, NPO status , Patient's Chart, lab work & pertinent test results, reviewed documented beta blocker date and time   History of Anesthesia Complications Negative for: history of anesthetic complications  Airway Mallampati: I  TM Distance: >3 FB Neck ROM: Full    Dental  (+) Edentulous Upper, Edentulous Lower   Pulmonary former smoker,    breath sounds clear to auscultation       Cardiovascular hypertension, Pt. on medications and Pt. on home beta blockers (-) angina Rhythm:Regular Rate:Normal     Neuro/Psych negative neurological ROS     GI/Hepatic Neg liver ROS, hiatal hernia, GERD  Medicated and Controlled,  Endo/Other  diabetes (glu 114), Oral Hypoglycemic Agents  Renal/GU negative Renal ROS     Musculoskeletal  (+) Arthritis ,   Abdominal   Peds  Hematology negative hematology ROS (+)   Anesthesia Other Findings   Reproductive/Obstetrics                            Anesthesia Physical Anesthesia Plan  ASA: 3  Anesthesia Plan: MAC   Post-op Pain Management: Tylenol PO (pre-op)* and Minimal or no pain anticipated   Induction:   PONV Risk Score and Plan: 2 and Ondansetron and Dexamethasone  Airway Management Planned: Simple Face Mask and Natural Airway  Additional Equipment: None  Intra-op Plan:   Post-operative Plan:   Informed Consent: I have reviewed the patients History and Physical, chart, labs and discussed the procedure including the risks, benefits and alternatives for the proposed anesthesia with the patient or authorized representative who has indicated his/her understanding and acceptance.       Plan Discussed with: CRNA and Surgeon  Anesthesia Plan Comments:        Anesthesia Quick Evaluation

## 2021-08-19 ENCOUNTER — Encounter (HOSPITAL_BASED_OUTPATIENT_CLINIC_OR_DEPARTMENT_OTHER): Payer: Self-pay | Admitting: Surgery

## 2021-08-20 LAB — SURGICAL PATHOLOGY

## 2021-09-08 ENCOUNTER — Other Ambulatory Visit: Payer: Self-pay | Admitting: Internal Medicine

## 2021-09-08 DIAGNOSIS — E2839 Other primary ovarian failure: Secondary | ICD-10-CM

## 2021-09-14 ENCOUNTER — Ambulatory Visit
Admission: RE | Admit: 2021-09-14 | Discharge: 2021-09-14 | Disposition: A | Discharge status: Home / Self Care | Payer: Medicare HMO | Attending: Family Medicine | Admitting: Family Medicine

## 2021-09-14 ENCOUNTER — Ambulatory Visit
Admission: RE | Admit: 2021-09-14 | Discharge: 2021-09-14 | Disposition: A | Discharge status: Home / Self Care | Payer: Medicare HMO | Source: Ambulatory Visit | Attending: Family Medicine | Admitting: Family Medicine

## 2021-09-14 DIAGNOSIS — S92352A Displaced fracture of fifth metatarsal bone, left foot, initial encounter for closed fracture: Secondary | ICD-10-CM

## 2021-09-14 DIAGNOSIS — S92352D Displaced fracture of fifth metatarsal bone, left foot, subsequent encounter for fracture with routine healing: Secondary | ICD-10-CM | POA: Diagnosis not present

## 2021-09-15 ENCOUNTER — Telehealth: Payer: Self-pay

## 2021-09-15 NOTE — Telephone Encounter (Signed)
Called pt left VM as a reminder to call and schedule a bone density. P: [phone] ? ?KP ?

## 2021-09-21 ENCOUNTER — Encounter: Payer: Self-pay | Admitting: Family Medicine

## 2021-09-21 ENCOUNTER — Other Ambulatory Visit: Payer: Self-pay

## 2021-09-21 ENCOUNTER — Ambulatory Visit (INDEPENDENT_AMBULATORY_CARE_PROVIDER_SITE_OTHER): Payer: Medicare HMO | Admitting: Family Medicine

## 2021-09-21 VITALS — BP 138/86 | HR 80 | Ht 66.0 in | Wt 187.0 lb

## 2021-09-21 DIAGNOSIS — S92352D Displaced fracture of fifth metatarsal bone, left foot, subsequent encounter for fracture with routine healing: Secondary | ICD-10-CM | POA: Diagnosis not present

## 2021-09-21 MED ORDER — MELOXICAM 15 MG PO TABS: 15.0000 mg | ORAL_TABLET | Freq: Every day | ORAL | 0 refills | Status: DC

## 2021-09-21 NOTE — Assessment & Plan Note (Signed)
Patient returns for follow-up to left forefoot/midfoot lateral pain, at her last visit on 08/11/2021 she was found to have subacute fifth metatarsal angulated displaced fracture, was advised postop shoe, Rx vitamin D, and follow-up. ? ?Since that time she has noted essential resolution of her forefoot pain, has noted issues with her back citing size of postop shoe, has been compliant with medication regimen.  Examination shows nontender distal fifth metatarsal, both to palpation and percussion, x-rays show interval callus formation. ? ?Given her excellent interval progress I discussed wean and discontinuation of postop shoe, finish out Rx vitamin D, and follow-up as needed.  She was advised to use symptoms as a guide for activity advance and to contact us if symptoms persist at the 4-week mark or beyond, at that point new x-rays and follow-up would be indicated. ? ? ?

## 2021-09-21 NOTE — Progress Notes (Signed)
?  ? ?  Primary Care / Sports Medicine Office Visit ? ?Patient Information:  ?Patient ID: Kiara Campbell, female DOB: 08/15/1955 Age: 66 y.o. MRN: [id number]  ? ?Kiara Campbell is a pleasant 66 y.o. female presenting with the following: ? ?Chief Complaint  ?Patient presents with  ? Foot Injury  ?  Follow up left foot fracture, is better but still painful, states being in boot is hurting back more.   ? ? ?Vitals:  ? 09/21/21 1507  ?BP: 138/86  ?Pulse: 80  ?SpO2: 98%  ? ?Vitals:  ? 09/21/21 1507  ?Weight: 187 lb (84.8 kg)  ?Height: '5\' 6"'$  (1.676 m)  ? ?Body mass index is 30.18 kg/m?. ? ?DG Foot Complete Left ? ?Result Date: 09/15/2021 ?CLINICAL DATA:  Follow-up subacute fifth metatarsal fracture. EXAM: LEFT FOOT - COMPLETE 3+ VIEW COMPARISON:  07/23/2021 FINDINGS: Redemonstration of cortical thickening suggesting a chronic fracture of the distal shaft of fifth metatarsal with mild medial displacement of the distal fracture component and mild lateral apex angulation of the fracture unchanged. Mild joint space narrowing of the interphalangeal joints diffusely. Mild great toe metatarsophalangeal joint osteoarthritis. Large plantar calcaneal heel spur. Moderate chronic enthesopathic change at the Achilles insertion on the calcaneus. 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 IMPRESSION:: IMPRESSION: 1. Remote appearing mildly angulated and displaced fracture of the distal shaft of the fifth metatarsal. 2. Large plantar calcaneal heel spur. 3. Mild-to-moderate osteoarthritis as above. Electronically Signed   By: Yvonne Kendall M.D.   On: 09/15/2021 18:06    ? ?Independent interpretation of notes and tests performed by another provider:  ? ?Independent interpretation of left foot x-rays dated 09/15/2021 reveals interval callus formation without interval shift in the angulated displaced distal fifth metatarsal fracture, prominent calcaneal spur and Achilles enthesophyte noted, stable ? ?Procedures  performed:  ? ?None ? ?Pertinent History, Exam, Impression, and Recommendations:  ? ?Displaced fracture of fifth metatarsal bone, left foot, initial encounter for closed fracture ?Patient returns for follow-up to left forefoot/midfoot lateral pain, at her last visit on 08/11/2021 she was found to have subacute fifth metatarsal angulated displaced fracture, was advised postop shoe, Rx vitamin D, and follow-up. ? ?Since that time she has noted essential resolution of her forefoot pain, has noted issues with her back citing size of postop shoe, has been compliant with medication regimen.  Examination shows nontender distal fifth metatarsal, both to palpation and percussion, x-rays show interval callus formation. ? ?Given her excellent interval progress I discussed wean and discontinuation of postop shoe, finish out Rx vitamin D, and follow-up as needed.  She was advised to use symptoms as a guide for activity advance and to contact us if symptoms persist at the 4-week mark or beyond, at that point new x-rays and follow-up would be indicated. ? ?  ? ?Orders & Medications ?Meds ordered this encounter  ?Medications  ? DISCONTD: meloxicam (MOBIC) 15 MG tablet  ?  Sig: Take 1 tablet (15 mg total) by mouth daily.  ?  Dispense:  30 tablet  ?  Refill:  0  ? ?No orders of the defined types were placed in this encounter. ?  ? ?Return if symptoms worsen or fail to improve.  ?  ? ?Montel Culver, MD ? ? Primary Care Sports Medicine ?[facility] ?[place]  ? ?

## 2021-09-22 ENCOUNTER — Ambulatory Visit: Payer: Medicare HMO | Admitting: Internal Medicine

## 2021-09-28 ENCOUNTER — Other Ambulatory Visit: Payer: Self-pay | Admitting: Internal Medicine

## 2021-09-28 DIAGNOSIS — I10 Essential (primary) hypertension: Secondary | ICD-10-CM

## 2021-09-28 DIAGNOSIS — E118 Type 2 diabetes mellitus with unspecified complications: Secondary | ICD-10-CM

## 2021-09-28 DIAGNOSIS — M10071 Idiopathic gout, right ankle and foot: Secondary | ICD-10-CM

## 2021-09-28 DIAGNOSIS — M5116 Intervertebral disc disorders with radiculopathy, lumbar region: Secondary | ICD-10-CM

## 2021-09-28 DIAGNOSIS — E1169 Type 2 diabetes mellitus with other specified complication: Secondary | ICD-10-CM

## 2021-09-28 DIAGNOSIS — M170 Bilateral primary osteoarthritis of knee: Secondary | ICD-10-CM

## 2021-09-30 NOTE — Telephone Encounter (Signed)
Requested Prescriptions  ?Pending Prescriptions Disp Refills  ?? allopurinol (ZYLOPRIM) 300 MG tablet [Pharmacy Med Name: ALLOPURINOL 300 MG Tablet] 90 tablet 2  ?  Sig: TAKE 1 TABLET EVERY DAY  ?  ? Endocrinology:  Gout Agents - allopurinol Failed - 09/28/2021  2:53 PM  ?  ?  Failed - Cr in normal range and within 360 days  ?  Creatinine, Ser  ?Date Value Ref Range Status  ?07/23/2021 1.38 (H) 0.57 - 1.00 mg/dL Final  ?   ?  ?  Passed - Uric Acid in normal range and within 360 days  ?  Uric Acid  ?Date Value Ref Range Status  ?07/23/2021 5.6 3.0 - 7.2 mg/dL Final  ?  Comment:  ?             Therapeutic target for gout patients: <6.0  ?   ?  ?  Passed - Valid encounter within last 12 months  ?  Recent Outpatient Visits   ?      ? 1 week ago Closed displaced fracture of fifth metatarsal bone of left foot with routine healing, subsequent encounter  ? [facility] Montel Culver, MD  ? 1 month ago Displaced fracture of fifth metatarsal bone, left foot, initial encounter for closed fracture  ? [facility] Montel Culver, MD  ? 2 months ago Essential (primary) hypertension  ? [facility] Glean Hess, MD  ? 6 months ago Tinea corporis  ? [facility] Glean Hess, MD  ? 6 months ago Annual physical exam  ? [facility] Glean Hess, MD  ?  ?  ?Future Appointments   ?        ? In 5 months Glean Hess, MD [facility]  ?  ? ?  ?  ?  Passed - CBC within normal limits and completed in the last 12 months  ?  WBC  ?Date Value Ref Range Status  ?07/23/2021 11.6 (H) 3.4 - 10.8 x10E3/uL Final  ?03/09/2021 8.6 4.0 - 10.5 K/uL Final  ? ?RBC  ?Date Value Ref Range Status  ?07/23/2021 2.97 (L) 3.77 - 5.28 x10E6/uL Final  ?03/09/2021 2.84 (L) 3.87 - 5.11 MIL/uL Final  ? ?Hemoglobin  ?Date Value Ref Range Status  ?07/23/2021 9.7 (L) 11.1 - 15.9 g/dL Final  ? ?Hematocrit  ?Date Value Ref Range Status  ?07/23/2021 28.6 (L) 34.0 - 46.6 % Final  ? ?MCHC   ?Date Value Ref Range Status  ?07/23/2021 33.9 31.5 - 35.7 g/dL Final  ?03/09/2021 33.3 30.0 - 36.0 g/dL Final  ? ?MCH  ?Date Value Ref Range Status  ?07/23/2021 32.7 26.6 - 33.0 pg Final  ?03/09/2021 32.4 26.0 - 34.0 pg Final  ? ?MCV  ?Date Value Ref Range Status  ?07/23/2021 96 79 - 97 fL Final  ? ?No results found for: PLTCOUNTKUC, LABPLAT, [place] ?RDW  ?Date Value Ref Range Status  ?07/23/2021 20.2 (H) 11.7 - 15.4 % Final  ? ?  ?  ?  ?? simvastatin (ZOCOR) 40 MG tablet [Pharmacy Med Name: SIMVASTATIN 40 MG Tablet] 90 tablet 2  ?  Sig: TAKE 1 TABLET AT BEDTIME  ?  ? Cardiovascular:  Antilipid - Statins Failed - 09/28/2021  2:53 PM  ?  ?  Failed - Lipid Panel in normal range within the last 12 months  ?  Cholesterol, Total  ?Date Value Ref Range Status  ?04/07/2020 111 100 - 199 mg/dL Final  ? ?  LDL Chol Calc (NIH)  ?Date Value Ref Range Status  ?04/07/2020 39 0 - 99 mg/dL Final  ? ?HDL  ?Date Value Ref Range Status  ?04/07/2020 44 >39 mg/dL Final  ? ?Triglycerides  ?Date Value Ref Range Status  ?04/07/2020 171 (H) 0 - 149 mg/dL Final  ? ?  ?  ?  Passed - Patient is not pregnant  ?  ?  Passed - Valid encounter within last 12 months  ?  Recent Outpatient Visits   ?      ? 1 week ago Closed displaced fracture of fifth metatarsal bone of left foot with routine healing, subsequent encounter  ? [facility] Montel Culver, MD  ? 1 month ago Displaced fracture of fifth metatarsal bone, left foot, initial encounter for closed fracture  ? [facility] Montel Culver, MD  ? 2 months ago Essential (primary) hypertension  ? [facility] Glean Hess, MD  ? 6 months ago Tinea corporis  ? [facility] Glean Hess, MD  ? 6 months ago Annual physical exam  ? [facility] Glean Hess, MD  ?  ?  ?Future Appointments   ?        ? In 5 months Glean Hess, MD [facility]  ?  ? ?  ?  ?  ?? metoprolol succinate (TOPROL-XL) 50 MG 24 hr tablet  [Pharmacy Med Name: METOPROLOL SUCCINATE ER 50 MG Tablet Extended Release 24 Hour] 270 tablet 1  ?  Sig: TAKE 3 TABLETS DAILY WITH OR IMMEDIATELY FOLLOWING A MEAL  ?  ? Cardiovascular:  Beta Blockers Passed - 09/28/2021  2:53 PM  ?  ?  Passed - Last BP in normal range  ?  BP Readings from Last 1 Encounters:  ?09/21/21 138/86  ?   ?  ?  Passed - Last Heart Rate in normal range  ?  Pulse Readings from Last 1 Encounters:  ?09/21/21 80  ?   ?  ?  Passed - Valid encounter within last 6 months  ?  Recent Outpatient Visits   ?      ? 1 week ago Closed displaced fracture of fifth metatarsal bone of left foot with routine healing, subsequent encounter  ? [facility] Montel Culver, MD  ? 1 month ago Displaced fracture of fifth metatarsal bone, left foot, initial encounter for closed fracture  ? [facility] Montel Culver, MD  ? 2 months ago Essential (primary) hypertension  ? [facility] Glean Hess, MD  ? 6 months ago Tinea corporis  ? [facility] Glean Hess, MD  ? 6 months ago Annual physical exam  ? [facility] Glean Hess, MD  ?  ?  ?Future Appointments   ?        ? In 5 months Glean Hess, MD [facility]  ?  ? ?  ?  ?  ?? celecoxib (CELEBREX) 200 MG capsule [Pharmacy Med Name: CELECOXIB 200 MG Capsule] 180 capsule 1  ?  Sig: TAKE 1 CAPSULE TWICE DAILY  ?  ? Analgesics:  COX2 Inhibitors Failed - 09/28/2021  2:53 PM  ?  ?  Failed - Manual Review: Labs are only required if the patient has taken medication for more than 8 weeks.  ?  ?  Failed - HGB in normal range and within 360 days  ?  Hemoglobin  ?Date  Value Ref Range Status  ?07/23/2021 9.7 (L) 11.1 - 15.9 g/dL Final  ?   ?  ?  Failed - Cr in normal range and within 360 days  ?  Creatinine, Ser  ?Date Value Ref Range Status  ?07/23/2021 1.38 (H) 0.57 - 1.00 mg/dL Final  ?   ?  ?  Failed - HCT in normal range and within 360 days  ?  Hematocrit  ?Date Value Ref Range Status   ?07/23/2021 28.6 (L) 34.0 - 46.6 % Final  ?   ?  ?  Passed - AST in normal range and within 360 days  ?  AST  ?Date Value Ref Range Status  ?07/23/2021 18 0 - 40 IU/L Final  ?   ?  ?  Passed - ALT in normal range and within 360 days  ?  ALT  ?Date Value Ref Range Status  ?07/23/2021 20 0 - 32 IU/L Final  ?   ?  ?  Passed - eGFR is 30 or above and within 360 days  ?  GFR calc Af Amer  ?Date Value Ref Range Status  ?04/07/2020 60 >59 mL/min/1.73 Final  ?  Comment:  ?  **[facility] currently reports eGFR in compliance with the current** ?  recommendations of the [employer]. [facility] will ?  update reporting as new guidelines are published from the NKF-ASN ?  Task force. ?  ? ?GFR, Estimated  ?Date Value Ref Range Status  ?03/09/2021 50 (L) >60 mL/min Final  ?  Comment:  ?  (NOTE) ?Calculated using the CKD-EPI Creatinine Equation (2021) ?  ? ?eGFR  ?Date Value Ref Range Status  ?07/23/2021 42 (L) >59 mL/min/1.73 Final  ?   ?  ?  Passed - Patient is not pregnant  ?  ?  Passed - Valid encounter within last 12 months  ?  Recent Outpatient Visits   ?      ? 1 week ago Closed displaced fracture of fifth metatarsal bone of left foot with routine healing, subsequent encounter  ? [facility] Montel Culver, MD  ? 1 month ago Displaced fracture of fifth metatarsal bone, left foot, initial encounter for closed fracture  ? [facility] Montel Culver, MD  ? 2 months ago Essential (primary) hypertension  ? [facility] Glean Hess, MD  ? 6 months ago Tinea corporis  ? [facility] Glean Hess, MD  ? 6 months ago Annual physical exam  ? [facility] Glean Hess, MD  ?  ?  ?Future Appointments   ?        ? In 5 months Glean Hess, MD [facility]  ?  ? ?  ?  ?  ?? metFORMIN (GLUCOPHAGE-XR) 500 MG 24 hr tablet [Pharmacy Med Name: METFORMIN HYDROCHLORIDE ER 500 MG Tablet Extended Release 24 Hour] 90 tablet 1  ?  Sig: TAKE 1 TABLET  EVERY DAY WITH BREAKFAST  ?  ? Endocrinology:  Diabetes - Biguanides Failed - 09/28/2021  2:53 PM  ?  ?  Failed - Cr in normal range and within 360 days  ?  Creatinine, Ser  ?Date Value Ref Range Status  ?07/23/2021

## 2021-09-30 NOTE — Telephone Encounter (Signed)
Requested medications are due for refill today.  Unsure ? ?Requested medications are on the active medications list.  no ? ?Last refill. 03/09/2021 #180 0 refills ? ?Future visit scheduled.   yes ? ?Notes to clinic.  Medication was discontinued by Dr. Zigmund Daniel 09/21/2021. ? ? ? ?Requested Prescriptions  ?Pending Prescriptions Disp Refills  ? celecoxib (CELEBREX) 200 MG capsule [Pharmacy Med Name: CELECOXIB 200 MG Capsule] 180 capsule 1  ?  Sig: TAKE 1 CAPSULE TWICE DAILY  ?  ? Analgesics:  COX2 Inhibitors Failed - 09/28/2021  2:53 PM  ?  ?  Failed - Manual Review: Labs are only required if the patient has taken medication for more than 8 weeks.  ?  ?  Failed - HGB in normal range and within 360 days  ?  Hemoglobin  ?Date Value Ref Range Status  ?07/23/2021 9.7 (L) 11.1 - 15.9 g/dL Final  ?  ?  ?  ?  Failed - Cr in normal range and within 360 days  ?  Creatinine, Ser  ?Date Value Ref Range Status  ?07/23/2021 1.38 (H) 0.57 - 1.00 mg/dL Final  ?  ?  ?  ?  Failed - HCT in normal range and within 360 days  ?  Hematocrit  ?Date Value Ref Range Status  ?07/23/2021 28.6 (L) 34.0 - 46.6 % Final  ?  ?  ?  ?  Passed - AST in normal range and within 360 days  ?  AST  ?Date Value Ref Range Status  ?07/23/2021 18 0 - 40 IU/L Final  ?  ?  ?  ?  Passed - ALT in normal range and within 360 days  ?  ALT  ?Date Value Ref Range Status  ?07/23/2021 20 0 - 32 IU/L Final  ?  ?  ?  ?  Passed - eGFR is 30 or above and within 360 days  ?  GFR calc Af Amer  ?Date Value Ref Range Status  ?04/07/2020 60 >59 mL/min/1.73 Final  ?  Comment:  ?  **[facility] currently reports eGFR in compliance with the current** ?  recommendations of the [employer]. [facility] will ?  update reporting as new guidelines are published from the NKF-ASN ?  Task force. ?  ? ?GFR, Estimated  ?Date Value Ref Range Status  ?03/09/2021 50 (L) >60 mL/min Final  ?  Comment:  ?  (NOTE) ?Calculated using the CKD-EPI Creatinine Equation (2021) ?  ? ?eGFR  ?Date Value Ref  Range Status  ?07/23/2021 42 (L) >59 mL/min/1.73 Final  ?  ?  ?  ?  Passed - Patient is not pregnant  ?  ?  Passed - Valid encounter within last 12 months  ?  Recent Outpatient Visits   ? ?      ? 1 week ago Closed displaced fracture of fifth metatarsal bone of left foot with routine healing, subsequent encounter  ? [facility] Montel Culver, MD  ? 1 month ago Displaced fracture of fifth metatarsal bone, left foot, initial encounter for closed fracture  ? [facility] Montel Culver, MD  ? 2 months ago Essential (primary) hypertension  ? [facility] Glean Hess, MD  ? 6 months ago Tinea corporis  ? [facility] Glean Hess, MD  ? 6 months ago Annual physical exam  ? [facility] Glean Hess, MD  ? ?  ?  ?Future Appointments   ? ?        ?  In 5 months Army Melia Jesse Sans, MD [facility] [place]  ? ?  ? ?  ?  ?  ?Signed Prescriptions Disp Refills  ? allopurinol (ZYLOPRIM) 300 MG tablet 90 tablet 2  ?  Sig: TAKE 1 TABLET EVERY DAY  ?  ? Endocrinology:  Gout Agents - allopurinol Failed - 09/28/2021  2:53 PM  ?  ?  Failed - Cr in normal range and within 360 days  ?  Creatinine, Ser  ?Date Value Ref Range Status  ?07/23/2021 1.38 (H) 0.57 - 1.00 mg/dL Final  ?  ?  ?  ?  Passed - Uric Acid in normal range and within 360 days  ?  Uric Acid  ?Date Value Ref Range Status  ?07/23/2021 5.6 3.0 - 7.2 mg/dL Final  ?  Comment:  ?             Therapeutic target for gout patients: <6.0  ?  ?  ?  ?  Passed - Valid encounter within last 12 months  ?  Recent Outpatient Visits   ? ?      ? 1 week ago Closed displaced fracture of fifth metatarsal bone of left foot with routine healing, subsequent encounter  ? [facility] Montel Culver, MD  ? 1 month ago Displaced fracture of fifth metatarsal bone, left foot, initial encounter for closed fracture  ? [facility] Montel Culver, MD  ? 2 months ago Essential (primary) hypertension  ? [facility] Glean Hess, MD  ? 6 months ago Tinea corporis  ? [facility] Glean Hess, MD  ? 6 months ago Annual physical exam  ? [facility] Glean Hess, MD  ? ?  ?  ?Future Appointments   ? ?        ? In 5 months Glean Hess, MD [facility] [place]  ? ?  ? ?  ?  ?  Passed - CBC within normal limits and completed in the last 12 months  ?  WBC  ?Date Value Ref Range Status  ?07/23/2021 11.6 (H) 3.4 - 10.8 x10E3/uL Final  ?03/09/2021 8.6 4.0 - 10.5 K/uL Final  ? ?RBC  ?Date Value Ref Range Status  ?07/23/2021 2.97 (L) 3.77 - 5.28 x10E6/uL Final  ?03/09/2021 2.84 (L) 3.87 - 5.11 MIL/uL Final  ? ?Hemoglobin  ?Date Value Ref Range Status  ?07/23/2021 9.7 (L) 11.1 - 15.9 g/dL Final  ? ?Hematocrit  ?Date Value Ref Range Status  ?07/23/2021 28.6 (L) 34.0 - 46.6 % Final  ? ?MCHC  ?Date Value Ref Range Status  ?07/23/2021 33.9 31.5 - 35.7 g/dL Final  ?03/09/2021 33.3 30.0 - 36.0 g/dL Final  ? ?MCH  ?Date Value Ref Range Status  ?07/23/2021 32.7 26.6 - 33.0 pg Final  ?03/09/2021 32.4 26.0 - 34.0 pg Final  ? ?MCV  ?Date Value Ref Range Status  ?07/23/2021 96 79 - 97 fL Final  ? ?No results found for: PLTCOUNTKUC, LABPLAT, [place] ?RDW  ?Date Value Ref Range Status  ?07/23/2021 20.2 (H) 11.7 - 15.4 % Final  ? ?  ?  ?  ? simvastatin (ZOCOR) 40 MG tablet 90 tablet 2  ?  Sig: TAKE 1 TABLET AT BEDTIME  ?  ? Cardiovascular:  Antilipid - Statins Failed - 09/28/2021  2:53 PM  ?  ?  Failed - Lipid Panel in normal range within the last 12 months  ?  Cholesterol, Total  ?Date Value  Ref Range Status  ?04/07/2020 111 100 - 199 mg/dL Final  ? ?LDL Chol Calc (NIH)  ?Date Value Ref Range Status  ?04/07/2020 39 0 - 99 mg/dL Final  ? ?HDL  ?Date Value Ref Range Status  ?04/07/2020 44 >39 mg/dL Final  ? ?Triglycerides  ?Date Value Ref Range Status  ?04/07/2020 171 (H) 0 - 149 mg/dL Final  ? ?  ?  ?  Passed - Patient is not pregnant  ?  ?  Passed - Valid encounter within last 12 months  ?   Recent Outpatient Visits   ? ?      ? 1 week ago Closed displaced fracture of fifth metatarsal bone of left foot with routine healing, subsequent encounter  ? [facility] Montel Culver, MD  ? 1 month ago Displaced fracture of fifth metatarsal bone, left foot, initial encounter for closed fracture  ? [facility] Montel Culver, MD  ? 2 months ago Essential (primary) hypertension  ? [facility] Glean Hess, MD  ? 6 months ago Tinea corporis  ? [facility] Glean Hess, MD  ? 6 months ago Annual physical exam  ? [facility] Glean Hess, MD  ? ?  ?  ?Future Appointments   ? ?        ? In 5 months Glean Hess, MD [facility]  ? ?  ? ?  ?  ?  ? metoprolol succinate (TOPROL-XL) 50 MG 24 hr tablet 270 tablet 1  ?  Sig: TAKE 3 TABLETS DAILY WITH OR IMMEDIATELY FOLLOWING A MEAL  ?  ? Cardiovascular:  Beta Blockers Passed - 09/28/2021  2:53 PM  ?  ?  Passed - Last BP in normal range  ?  BP Readings from Last 1 Encounters:  ?09/21/21 138/86  ?  ?  ?  ?  Passed - Last Heart Rate in normal range  ?  Pulse Readings from Last 1 Encounters:  ?09/21/21 80  ?  ?  ?  ?  Passed - Valid encounter within last 6 months  ?  Recent Outpatient Visits   ? ?      ? 1 week ago Closed displaced fracture of fifth metatarsal bone of left foot with routine healing, subsequent encounter  ? [facility] Montel Culver, MD  ? 1 month ago Displaced fracture of fifth metatarsal bone, left foot, initial encounter for closed fracture  ? [facility] Montel Culver, MD  ? 2 months ago Essential (primary) hypertension  ? [facility] Glean Hess, MD  ? 6 months ago Tinea corporis  ? [facility] Glean Hess, MD  ? 6 months ago Annual physical exam  ? [facility] Glean Hess, MD  ? ?  ?  ?Future Appointments   ? ?        ? In 5 months Glean Hess, MD [facility]  ? ?  ? ?  ?  ?   ? metFORMIN (GLUCOPHAGE-XR) 500 MG 24 hr tablet 90 tablet 1  ?  Sig: TAKE 1 TABLET EVERY DAY WITH BREAKFAST  ?  ? Endocrinology:  Diabetes - Biguanides Failed - 09/28/2021  2:53 PM  ?  ?  Failed - Cr in

## 2021-10-01 ENCOUNTER — Ambulatory Visit (INDEPENDENT_AMBULATORY_CARE_PROVIDER_SITE_OTHER): Payer: Medicare HMO | Admitting: Internal Medicine

## 2021-10-01 ENCOUNTER — Encounter: Payer: Self-pay | Admitting: Internal Medicine

## 2021-10-01 VITALS — BP 136/78 | HR 79 | Temp 97.9°F | Ht 66.0 in | Wt 187.0 lb

## 2021-10-01 DIAGNOSIS — J0100 Acute maxillary sinusitis, unspecified: Secondary | ICD-10-CM

## 2021-10-01 DIAGNOSIS — H1033 Unspecified acute conjunctivitis, bilateral: Secondary | ICD-10-CM

## 2021-10-01 MED ORDER — PROMETHAZINE-DM 6.25-15 MG/5ML PO SYRP: 5.0000 mL | ORAL_SOLUTION | Freq: Four times a day (QID) | ORAL | 0 refills | Status: DC | PRN

## 2021-10-01 MED ORDER — CEFDINIR 300 MG PO CAPS: 300.0000 mg | ORAL_CAPSULE | Freq: Two times a day (BID) | ORAL | 0 refills | Status: AC

## 2021-10-01 MED ORDER — NEOMYCIN-POLYMYXIN-DEXAMETH 3.5-10000-0.1 OP SUSP: 2.0000 [drp] | Freq: Four times a day (QID) | OPHTHALMIC | 0 refills | Status: AC

## 2021-10-01 NOTE — Progress Notes (Signed)
? ? ?Date:  10/01/2021  ? ?Name:  Kiara Campbell   DOB:  1955-07-09   MRN:  [id number] ? ? ?Chief Complaint: Sinusitis and Conjunctivitis ? ?Sinusitis ?This is a new problem. The current episode started in the past 7 days. There has been no fever. The pain is moderate. Associated symptoms include congestion, coughing ([place]), headaches, sinus pressure and a sore throat. Pertinent negatives include no chills or shortness of breath. (No fever. Negative Covid yesterday)  ?Conjunctivitis  ?The current episode started 5 to 7 days ago. The onset was gradual. The problem occurs continuously. The problem has been gradually worsening. The problem is moderate. Associated symptoms include eye itching, congestion, headaches, rhinorrhea, sore throat, cough ([place]), eye pain and eye redness. Pertinent negatives include no fever, no diarrhea and no vomiting.  ? ?Lab Results  ?Component Value Date  ? NA 142 07/23/2021  ? K 4.5 07/23/2021  ? CO2 26 07/23/2021  ? GLUCOSE 110 (H) 07/23/2021  ? BUN 30 (H) 07/23/2021  ? CREATININE 1.38 (H) 07/23/2021  ? CALCIUM 10.1 07/23/2021  ? EGFR 42 (L) 07/23/2021  ? GFRNONAA 50 (L) 03/09/2021  ? ?Lab Results  ?Component Value Date  ? CHOL 111 04/07/2020  ? HDL 44 04/07/2020  ? [place] 39 04/07/2020  ? TRIG 171 (H) 04/07/2020  ? CHOLHDL 2.5 04/07/2020  ? ?Lab Results  ?Component Value Date  ? TSH 2.510 03/04/2019  ? ?Lab Results  ?Component Value Date  ? HGBA1C 6.5 (H) 07/23/2021  ? ?Lab Results  ?Component Value Date  ? WBC 11.6 (H) 07/23/2021  ? HGB 9.7 (L) 07/23/2021  ? HCT 28.6 (L) 07/23/2021  ? MCV 96 07/23/2021  ? PLT 323 07/23/2021  ? ?Lab Results  ?Component Value Date  ? ALT 20 07/23/2021  ? AST 18 07/23/2021  ? ALKPHOS 85 07/23/2021  ? BILITOT 0.3 07/23/2021  ? ?No results found for: 25OHVITD2, [place], VD25OH  ? ?Review of Systems  ?Constitutional:  Negative for chills, fatigue and fever.  ?HENT:  Positive for congestion, rhinorrhea, sinus pressure and sore throat.    ?Eyes:  Positive for pain, redness and itching.  ?Respiratory:  Positive for cough [employer]). Negative for shortness of breath.   ?Cardiovascular:  Negative for chest pain, palpitations and leg swelling.  ?Gastrointestinal:  Negative for diarrhea and vomiting.  ?Neurological:  Positive for headaches.  ? ?Patient Active Problem List  ? Diagnosis Date Noted  ? Displaced fracture of fifth metatarsal bone, left foot, initial encounter for closed fracture 08/11/2021  ? Anorectal ulcer 07/23/2021  ? Esophageal dysphagia   ? Schatzki's ring   ? Gastric erythema   ? Hiatal hernia   ? Special screening for malignant neoplasms, colon   ? Hx of skin cancer, basal cell 11/19/2018  ? Tobacco use disorder, severe, in early remission 08/09/2017  ? Obesity (BMI 30-39.9) 07/17/2017  ? GERD (gastroesophageal reflux disease) 07/12/2017  ? Primary osteoarthritis of both knees 08/01/2016  ? Type II diabetes mellitus with complication ([place]) 19/37/9024  ? Fibrocystic breast disease 12/03/2014  ? Colon polyp 12/03/2014  ? Diverticulitis of colon 12/03/2014  ? Essential (primary) hypertension 12/03/2014  ? 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  ? OP (osteoporosis) 12/03/2014  ? Hyperlipidemia associated with type 2 diabetes mellitus ([place]) 12/03/2014  ? ? ?Allergies  ?Allergen Reactions  ? Glipizide Nausea Only  ? ? ?Past Surgical History:  ?Procedure Laterality Date  ? ABDOMINAL HYSTERECTOMY    ? partial  ? ABDOMINAL  HYSTERECTOMY    ? APPENDECTOMY    ? BREAST CYST ASPIRATION    ? neg not sure which side  ? COLONOSCOPY WITH PROPOFOL N/A 02/03/2021  ? Procedure: COLONOSCOPY WITH PROPOFOL;  Surgeon: Virgel Manifold, MD;  Location: [facility] ENDOSCOPY;  Service: Endoscopy;  Laterality: N/A;  ? ESOPHAGOGASTRODUODENOSCOPY N/A 02/03/2021  ? Procedure: ESOPHAGOGASTRODUODENOSCOPY (EGD);  Surgeon: Virgel Manifold, MD;  Location: [facility] ENDOSCOPY;  Service: Endoscopy;  Laterality: N/A;  ? JOINT REPLACEMENT    ? LESION  DESTRUCTION N/A 08/18/2021  ? Procedure: BIOPSY OF ANORECTAL ULCER;  Surgeon: Ileana Roup, MD;  Location: [place];  Service: General;  Laterality: N/A;  ? RECTAL PROLAPSE REPAIR    ? 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  ? TONSILLECTOMY    ? VARICOSE VEIN SURGERY    ? ? ?Social History  ? ?Tobacco Use  ? Smoking status: Former  ?  Packs/day: 0.50  ?  Years: 30.00  ?  Pack years: 15.00  ?  Types: Cigarettes  ?  Quit date: 08/04/2018  ?  Years since quitting: 3.1  ? Smokeless tobacco: Never  ?Vaping Use  ? Vaping Use: Never used  ?Substance Use Topics  ? Alcohol use: Yes  ?  Comment: rarely  ? Drug use: Never  ? ? ? ?Medication list has been reviewed and updated. ? ?Current Meds  ?Medication Sig  ? Alcohol Swabs (B-D SINGLE USE SWABS REGULAR) PADS USE DAILY.  ? allopurinol (ZYLOPRIM) 300 MG tablet TAKE 1 TABLET EVERY DAY  ? azelastine (ASTELIN) 0.1 % nasal spray Place 1 spray into both nostrils 2 (two) times daily. Use in each nostril as directed  ? Calcium Carb-Ergocalciferol 500-200 MG-UNIT TABS Take 1 tablet by mouth daily.  ? celecoxib (CELEBREX) 200 MG capsule TAKE 1 CAPSULE TWICE DAILY  ? colchicine (COLCRYS) 0.6 MG tablet Take 1 tablet (0.6 mg total) by mouth 2 (two) times daily.  ? fexofenadine (ALLEGRA) 60 MG tablet Take 60 mg by mouth 2 (two) times daily.  ? FIBER PO Take 1 capsule by mouth in the morning and at bedtime.  ? fluticasone (FLONASE) 50 MCG/ACT nasal spray Place 1 spray into both nostrils daily.  ? gabapentin (NEURONTIN) 100 MG capsule Take 2 capsules (200 mg total) by mouth 2 (two) times daily. (Patient taking differently: Take 100 mg by mouth 2 (two) times daily.)  ? glucose blood (TRUE METRIX BLOOD GLUCOSE TEST) test strip TEST EVERY DAY  ? Lancet Devices (ACCU-CHEK SOFTCLIX) lancets 1 each by Other route daily. Use as instructed  ? Lancets Misc. (ACCU-CHEK SOFTCLIX LANCET DEV) KIT 1 each by Does not apply route daily.  ? losartan-hydrochlorothiazide (HYZAAR) 100-25  MG tablet TAKE 1 TABLET EVERY DAY  ? metFORMIN (GLUCOPHAGE-XR) 500 MG 24 hr tablet TAKE 1 TABLET EVERY DAY WITH BREAKFAST  ? metoprolol succinate (TOPROL-XL) 50 MG 24 hr tablet TAKE 3 TABLETS DAILY WITH OR IMMEDIATELY FOLLOWING A MEAL  ? mupirocin ointment (BACTROBAN) 2 % PLACE 1 APPLICATION INTO THE NOSE 2 TIMES A DAILY.  ? omeprazole (PRILOSEC) 20 MG capsule Take 1 capsule (20 mg total) by mouth daily.  ? simvastatin (ZOCOR) 40 MG tablet TAKE 1 TABLET AT BEDTIME  ? traMADol (ULTRAM) 50 MG tablet Take 1 tablet (50 mg total) by mouth every 6 (six) hours as needed.  ? vitamin C (ASCORBIC ACID) 500 MG tablet Take 500 mg by mouth daily.  ? Vitamin D, Ergocalciferol, (DRISDOL) 1.25 MG (50000 UNIT) CAPS capsule Take 1 capsule (  50,000 Units total) by mouth every 7 (seven) days. Take for 8 total doses(weeks)  ? ? ? ?  09/21/2021  ?  3:33 PM 08/11/2021  ? 11:06 AM 07/23/2021  ?  2:29 PM 03/31/2021  ?  1:21 PM  ?GAD 7 : Generalized Anxiety Score  ?Nervous, Anxious, on Edge 1 1 2 2   ?Control/stop worrying 1 3 2 1   ?Worry too much - different things 0 1 2 0  ?Trouble relaxing 1 3 1 1   ?Restless 1 1 1 1   ?Easily annoyed or irritable 1 1 1 1   ?Afraid - awful might happen 1 1 0 0  ?Total GAD 7 Score 6 11 9 6   ?Anxiety Difficulty  Somewhat difficult  Not difficult at all  ? ? ? ?  09/21/2021  ?  3:33 PM  ?Depression screen PHQ 2/9  ?Decreased Interest 1  ?Down, Depressed, Hopeless 0  ?PHQ - 2 Score 1  ?Altered sleeping 1  ?Tired, decreased energy 1  ?Change in appetite 2  ?Feeling bad or failure about yourself  1  ?Trouble concentrating 1  ?Moving slowly or fidgety/restless 0  ?Suicidal thoughts 0  ?PHQ-9 Score 7  ?Difficult doing work/chores Not difficult at all  ? ? ?BP Readings from Last 3 Encounters:  ?10/01/21 (!) 168/78  ?09/21/21 138/86  ?08/18/21 (!) 144/61  ? ? ?Physical Exam ?Constitutional:   ?   Appearance: She is well-developed.  ?HENT:  ?   Right Ear: Ear canal and external ear normal. Tympanic membrane is not erythematous  or retracted.  ?   Left Ear: Ear canal and external ear normal. Tympanic membrane is not erythematous or retracted.  ?   Nose:  ?   Right Sinus: Maxillary sinus tenderness and frontal sinus tenderness prese

## 2021-10-13 ENCOUNTER — Ambulatory Visit (INDEPENDENT_AMBULATORY_CARE_PROVIDER_SITE_OTHER): Payer: Medicare HMO

## 2021-10-13 VITALS — Ht 66.0 in | Wt 187.0 lb

## 2021-10-13 DIAGNOSIS — Z0000 Encounter for general adult medical examination without abnormal findings: Secondary | ICD-10-CM | POA: Diagnosis not present

## 2021-10-13 NOTE — Progress Notes (Signed)
? ?Subjective:  ? Kiara Campbell is a 66 y.o. female who presents for Medicare Annual (Subsequent) preventive examination. ? ?I connected with  Azari D Olivencia on 10/13/21 by a audio enabled telemedicine application and verified that I am speaking with the correct person using two identifiers. ? ?Patient Location: Home ? ?Provider Location: Home Office ? ?I discussed the limitations of evaluation and management by telemedicine. The patient expressed understanding and agreed to proceed.  ? ?Review of Systems    ?Defer to PCP ?  ?   ?Objective:  ?  ?Today's Vitals  ? 10/13/21 1449 10/13/21 1455  ?Weight: 187 lb (84.8 kg) 187 lb (84.8 kg)  ?Height: 5' 6" (1.676 m) 5' 6" (1.676 m)  ?PainSc:  4   ?PainLoc:  Back  ? ?Body mass index is 30.18 kg/m?. ? ? ?  10/13/2021  ?  3:00 PM 08/18/2021  ? 12:06 PM 02/03/2021  ?  8:29 AM 05/22/2019  ?  1:35 PM 08/09/2018  ?  1:41 PM 02/21/2018  ?  2:58 PM 02/07/2017  ? 10:33 AM  ?Advanced Directives  ?Does Patient Have a Medical Advance Directive? No No No No No No No  ?Would patient like information on creating a medical advance directive? No - Patient declined No - Patient declined  Yes (MAU/Ambulatory/Procedural Areas - Information given)  Yes (MAU/Ambulatory/Procedural Areas - Information given)   ? ? ?Current Medications (verified) ?Outpatient Encounter Medications as of 10/13/2021  ?Medication Sig  ? Alcohol Swabs (B-D SINGLE USE SWABS REGULAR) PADS USE DAILY.  ? allopurinol (ZYLOPRIM) 300 MG tablet TAKE 1 TABLET EVERY DAY  ? azelastine (ASTELIN) 0.1 % nasal spray Place 1 spray into both nostrils 2 (two) times daily. Use in each nostril as directed  ? Calcium Carb-Ergocalciferol 500-200 MG-UNIT TABS Take 1 tablet by mouth daily.  ? celecoxib (CELEBREX) 200 MG capsule TAKE 1 CAPSULE TWICE DAILY  ? colchicine (COLCRYS) 0.6 MG tablet Take 1 tablet (0.6 mg total) by mouth 2 (two) times daily.  ? fexofenadine (ALLEGRA) 60 MG tablet Take 60 mg by mouth 2 (two) times daily.  ? FIBER PO Take 1  capsule by mouth in the morning and at bedtime.  ? fluticasone (FLONASE) 50 MCG/ACT nasal spray Place 1 spray into both nostrils daily.  ? gabapentin (NEURONTIN) 100 MG capsule Take 2 capsules (200 mg total) by mouth 2 (two) times daily. (Patient taking differently: Take 100 mg by mouth 2 (two) times daily.)  ? glucose blood (TRUE METRIX BLOOD GLUCOSE TEST) test strip TEST EVERY DAY  ? Lancet Devices (ACCU-CHEK SOFTCLIX) lancets 1 each by Other route daily. Use as instructed  ? Lancets Misc. (ACCU-CHEK SOFTCLIX LANCET DEV) KIT 1 each by Does not apply route daily.  ? losartan-hydrochlorothiazide (HYZAAR) 100-25 MG tablet TAKE 1 TABLET EVERY DAY  ? metFORMIN (GLUCOPHAGE-XR) 500 MG 24 hr tablet TAKE 1 TABLET EVERY DAY WITH BREAKFAST  ? metoprolol succinate (TOPROL-XL) 50 MG 24 hr tablet TAKE 3 TABLETS DAILY WITH OR IMMEDIATELY FOLLOWING A MEAL  ? mupirocin ointment (BACTROBAN) 2 % PLACE 1 APPLICATION INTO THE NOSE 2 TIMES A DAILY.  ? omeprazole (PRILOSEC) 20 MG capsule Take 1 capsule (20 mg total) by mouth daily.  ? simvastatin (ZOCOR) 40 MG tablet TAKE 1 TABLET AT BEDTIME  ? traMADol (ULTRAM) 50 MG tablet Take 1 tablet (50 mg total) by mouth every 6 (six) hours as needed.  ? vitamin C (ASCORBIC ACID) 500 MG tablet Take 500 mg by mouth daily.  ?   Vitamin D, Ergocalciferol, (DRISDOL) 1.25 MG (50000 UNIT) CAPS capsule Take 1 capsule (50,000 Units total) by mouth every 7 (seven) days. Take for 8 total doses(weeks)  ? [DISCONTINUED] promethazine-dextromethorphan (PROMETHAZINE-DM) 6.25-15 MG/5ML syrup Take 5 mLs by mouth 4 (four) times daily as needed for cough.  ? ?No facility-administered encounter medications on file as of 10/13/2021.  ? ? ?Allergies (verified) ?Glipizide  ? ?History: ?Past Medical History:  ?Diagnosis Date  ? Calcium blood increased 12/23/2013  ? Diabetes mellitus without complication ([place])   ? Gout   ? Hyperlipidemia   ? Hypertension   ? Osteopenia   ? ?Past Surgical History:  ?Procedure Laterality Date   ? ABDOMINAL HYSTERECTOMY    ? partial  ? ABDOMINAL HYSTERECTOMY    ? APPENDECTOMY    ? BREAST CYST ASPIRATION    ? neg not sure which side  ? COLONOSCOPY WITH PROPOFOL N/A 02/03/2021  ? Procedure: COLONOSCOPY WITH PROPOFOL;  Surgeon: Virgel Manifold, MD;  Location: [facility] ENDOSCOPY;  Service: Endoscopy;  Laterality: N/A;  ? ESOPHAGOGASTRODUODENOSCOPY N/A 02/03/2021  ? Procedure: ESOPHAGOGASTRODUODENOSCOPY (EGD);  Surgeon: Virgel Manifold, MD;  Location: [facility] ENDOSCOPY;  Service: Endoscopy;  Laterality: N/A;  ? JOINT REPLACEMENT    ? LESION DESTRUCTION N/A 08/18/2021  ? Procedure: BIOPSY OF ANORECTAL ULCER;  Surgeon: Ileana Roup, MD;  Location: [place];  Service: General;  Laterality: N/A;  ? RECTAL PROLAPSE REPAIR    ? 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  ? TONSILLECTOMY    ? VARICOSE VEIN SURGERY    ? ?Family History  ?Problem Relation Age of Onset  ? CAD Father   ?     died age 1  ? COPD Mother   ? Diabetes Maternal Grandmother   ? Breast cancer Neg Hx   ? ?Social History  ? ?Socioeconomic History  ? Marital status: Married  ?  Spouse name: Laverna Peace Moritz  ? Number of children: 2  ? Years of education: 42  ? Highest education level: Bachelor's degree (e.g., BA, AB, BS)  ?Occupational History  ? Occupation: Retired  ?Tobacco Use  ? Smoking status: Former  ?  Packs/day: 0.50  ?  Years: 30.00  ?  Pack years: 15.00  ?  Types: Cigarettes  ?  Quit date: 08/04/2018  ?  Years since quitting: 3.1  ? Smokeless tobacco: Never  ? Tobacco comments:  ?  Patient declines Lung Cancer Screening.  ?Vaping Use  ? Vaping Use: Never used  ?Substance and Sexual Activity  ? Alcohol use: Yes  ?  Comment: rarely  ? Drug use: Never  ? Sexual activity: Not Currently  ?  Partners: Male  ?Other Topics Concern  ? Not on file  ?Social History Narrative  ? Not on file  ? ?Social Determinants of Health  ? ?Financial Resource Strain: Low Risk   ? Difficulty of Paying Living Expenses: Not hard at all  ?Food  Insecurity: No Food Insecurity  ? Worried About Charity fundraiser in the Last Year: Never true  ? Ran Out of Food in the Last Year: Never true  ?Transportation Needs: No Transportation Needs  ? Lack of Transportation (Medical): No  ? Lack of Transportation (Non-Medical): No  ?Physical Activity: Inactive  ? Days of Exercise per Week: 0 days  ? Minutes of Exercise per Session: 0 min  ?Stress: No Stress Concern Present  ? Feeling of Stress : Only a little  ?Social Connections: Unknown  ? Frequency of Communication with  Friends and Family: More than three times a week  ? Frequency of Social Gatherings with Friends and Family: More than three times a week  ? Attends Religious Services: Patient refused  ? Active Member of Clubs or Organizations: Patient refused  ? Attends Club or Organization Meetings: Patient refused  ? Marital Status: Married  ? ? ?Tobacco Counseling ?Counseling given: Yes ?Tobacco comments: Patient declines Lung Cancer Screening. ? ? ?Clinical Intake: ? ? ?Pain : 0-10 ?Pain Score: 4  ?Pain Type: Chronic pain ?Pain Location: Hip ?Pain Orientation: Right ?Pain Descriptors / Indicators: Aching, Pressure, Stabbing, Shooting ?Pain Onset: More than a month ago ?Pain Frequency: Constant ? ? ?Diabetes: Yes (BS was 132 this week. Checks it once a week.) ?CBG done?: No ?Did pt. bring in CBG monitor from home?: No ? ?How often do you need to have someone help you when you read instructions, pamphlets, or other written materials from your doctor or pharmacy?: 1 - Never ? ?Diabetic? YES ? ?Interpreter Needed?: No ? ?Information entered by ::  , CMA ? ? ?Activities of Daily Living ? ?  10/13/2021  ?  2:58 PM 10/01/2021  ? 11:09 AM  ?In your present state of health, do you have any difficulty performing the following activities:  ?Hearing? 0 0  ?Vision? 0 0  ?Difficulty concentrating or making decisions? 0 0  ?Walking or climbing stairs? 1 1  ?Dressing or bathing? 0 0  ?Doing errands, shopping? 0 0   ? ? ?Patient Care Team: ?Berglund, Laura H, MD as PCP - General (Internal Medicine) ?[facility] as Consulting Physician ?Kelley, Scott, MD as Consulting Physician (Orthopedic Surgery) ?[facility] (Optometry

## 2021-10-19 DIAGNOSIS — N3946 Mixed incontinence: Secondary | ICD-10-CM | POA: Diagnosis not present

## 2021-10-25 DIAGNOSIS — N3946 Mixed incontinence: Secondary | ICD-10-CM | POA: Diagnosis not present

## 2021-11-09 ENCOUNTER — Other Ambulatory Visit: Payer: Self-pay | Admitting: Internal Medicine

## 2021-11-09 DIAGNOSIS — M170 Bilateral primary osteoarthritis of knee: Secondary | ICD-10-CM

## 2021-11-09 DIAGNOSIS — M5116 Intervertebral disc disorders with radiculopathy, lumbar region: Secondary | ICD-10-CM

## 2021-11-10 ENCOUNTER — Other Ambulatory Visit: Payer: Self-pay | Admitting: Internal Medicine

## 2021-11-10 DIAGNOSIS — M5116 Intervertebral disc disorders with radiculopathy, lumbar region: Secondary | ICD-10-CM

## 2021-11-10 DIAGNOSIS — M170 Bilateral primary osteoarthritis of knee: Secondary | ICD-10-CM

## 2021-11-10 MED ORDER — TRAMADOL HCL 50 MG PO TABS: 50.0000 mg | ORAL_TABLET | Freq: Four times a day (QID) | ORAL | 0 refills | Status: DC | PRN

## 2021-11-10 NOTE — Telephone Encounter (Signed)
Requested medication (s) are due for refill today:   Provider to review ? ?Requested medication (s) are on the active medication list:   Yes ? ?Future visit scheduled:   Yes ? ? ?Last ordered: 07/23/2021 #60, 0 refill ? ?Non delegated refill reason returned  ? ?Requested Prescriptions  ?Pending Prescriptions Disp Refills  ? traMADol (ULTRAM) 50 MG tablet [Pharmacy Med Name: traMADol HCl 50 MG Oral Tablet] 60 tablet 0  ?  Sig: TAKE 1 TABLET BY MOUTH EVERY 6 HOURS AS NEEDED  ?  ? Not Delegated - Analgesics:  Opioid Agonists Failed - 11/09/2021  5:06 PM  ?  ?  Failed - This refill cannot be delegated  ?  ?  Failed - Urine Drug Screen completed in last 360 days  ?  ?  Passed - Valid encounter within last 3 months  ?  Recent Outpatient Visits   ? ?      ? 1 month ago Acute non-recurrent maxillary sinusitis  ? [facility] Glean Hess, MD  ? 1 month ago Closed displaced fracture of fifth metatarsal bone of left foot with routine healing, subsequent encounter  ? [facility] Montel Culver, MD  ? 3 months ago Displaced fracture of fifth metatarsal bone, left foot, initial encounter for closed fracture  ? [facility] Montel Culver, MD  ? 3 months ago Essential (primary) hypertension  ? [facility] Glean Hess, MD  ? 7 months ago Tinea corporis  ? [facility] Glean Hess, MD  ? ?  ?  ?Future Appointments   ? ?        ? In 4 months [facility] Melia Jesse Sans, MD [facility] [place]  ? ?  ? ? ?  ?  ?  ? ?

## 2021-11-17 ENCOUNTER — Other Ambulatory Visit: Payer: Medicare HMO

## 2021-11-22 ENCOUNTER — Other Ambulatory Visit: Payer: Medicare HMO

## 2021-12-13 ENCOUNTER — Ambulatory Visit: Payer: Self-pay

## 2021-12-13 ENCOUNTER — Ambulatory Visit
Admission: EM | Admit: 2021-12-13 | Discharge: 2021-12-13 | Disposition: A | Discharge status: Home / Self Care | Payer: Medicare HMO | Attending: Emergency Medicine | Admitting: Emergency Medicine

## 2021-12-13 DIAGNOSIS — L239 Allergic contact dermatitis, unspecified cause: Secondary | ICD-10-CM

## 2021-12-13 MED ORDER — PREDNISONE 10 MG (21) PO TBPK: ORAL_TABLET | ORAL | 0 refills | Status: DC

## 2021-12-13 NOTE — ED Provider Notes (Signed)
[facility]    CSN: [id number] Arrival date & time: 12/13/21  1547      History   Chief Complaint Chief Complaint  Patient presents with   Rash    HPI Kiara Campbell is a 66 y.o. female.   HPI  62 old female here for evaluation of skin complaint.  Patient reports that 3 days ago she developed itching and swelling to the fingers and palms of both hands.  This is led to some flaky skin and some scabbing on the dorsal surface of her fingers where she has been scratching.  The patient reports that she is been applying Aquaphor and she has not had any improvement.  She is unsure of what she may have come in contact with.  It does not look like poison ivy there is no vesicles or pustules.  She denies any new personal hygiene products, or soaps.  She thinks she may have gotten some hand sanitizer that she did not react well to.  She denies fever or drainage.  She also denies any swelling of her lips or tongue, tightness in her throat, or difficulty breathing.  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   Tobacco use disorder, severe, in early remission 08/09/2017   Obesity (BMI 30-39.9) 07/17/2017   GERD (gastroesophageal reflux disease) 07/12/2017   Primary osteoarthritis of both knees 08/01/2016   Type II diabetes mellitus with complication ([place]) 01/75/1025   Fibrocystic breast disease 12/03/2014   Colon polyp 12/03/2014   Diverticulitis of colon 12/03/2014    Essential (primary) hypertension 12/03/2014   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   OP (osteoporosis) 12/03/2014   Hyperlipidemia associated with type 2 diabetes mellitus ([place]) 12/03/2014    Past Surgical History:  Procedure Laterality Date   ABDOMINAL HYSTERECTOMY     partial   ABDOMINAL HYSTERECTOMY     APPENDECTOMY     BREAST CYST ASPIRATION     neg not sure which side   COLONOSCOPY WITH PROPOFOL N/A 02/03/2021   Procedure: COLONOSCOPY WITH PROPOFOL;  Surgeon: Virgel Manifold, MD;  Location: [facility] ENDOSCOPY;  Service: Endoscopy;  Laterality: N/A;   ESOPHAGOGASTRODUODENOSCOPY N/A 02/03/2021   Procedure: ESOPHAGOGASTRODUODENOSCOPY (EGD);  Surgeon: Virgel Manifold, MD;  Location: [facility] ENDOSCOPY;  Service: Endoscopy;  Laterality: N/A;   JOINT REPLACEMENT     LESION DESTRUCTION N/A 08/18/2021   Procedure: BIOPSY OF ANORECTAL ULCER;  Surgeon: Ileana Roup, MD;  Location: [place];  Service: General;  Laterality: N/A;   RECTAL PROLAPSE REPAIR     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   TONSILLECTOMY     VARICOSE VEIN SURGERY      OB History   No obstetric history on file.      Home Medications    Prior to Admission medications   Medication Sig Start Date End Date Taking? Authorizing Provider  predniSONE (STERAPRED UNI-PAK 21 TAB) 10  MG (21) TBPK tablet Take 6 tablets on day 1, 5 tablets day 2, 4 tablets day 3, 3 tablets day 4, 2 tablets day 5, 1 tablet day 6 12/13/21  Yes Margarette Canada, NP  Alcohol Swabs (B-D SINGLE USE SWABS REGULAR) PADS USE DAILY. 03/19/20   Glean Hess, MD  allopurinol (ZYLOPRIM) 300 MG tablet TAKE 1 TABLET EVERY DAY 09/30/21   Glean Hess, MD  azelastine (ASTELIN) 0.1 % nasal spray Place 1 spray into both nostrils 2 (two) times daily. Use in each nostril as directed 03/09/21   Glean Hess, MD  Calcium Carb-Ergocalciferol 500-200 MG-UNIT TABS Take 1 tablet by mouth daily.    [provider]   celecoxib (CELEBREX) 200 MG capsule TAKE 1 CAPSULE TWICE DAILY 09/30/21   Glean Hess, MD  colchicine (COLCRYS) 0.6 MG tablet Take 1 tablet (0.6 mg total) by mouth 2 (two) times daily. 07/06/21   Glean Hess, MD  fexofenadine (ALLEGRA) 60 MG tablet Take 60 mg by mouth 2 (two) times daily.    [provider]  FIBER PO Take 1 capsule by mouth in the morning and at bedtime.    [provider]  fluticasone (FLONASE) 50 MCG/ACT nasal spray Place 1 spray into both nostrils daily. 02/12/15   [provider]  gabapentin (NEURONTIN) 100 MG capsule Take 2 capsules (200 mg total) by mouth 2 (two) times daily. Patient taking differently: Take 100 mg by mouth 2 (two) times daily. 07/23/21   Glean Hess, MD  glucose blood (TRUE METRIX BLOOD GLUCOSE TEST) test strip TEST EVERY DAY 03/11/21   Glean Hess, MD  Lancet Devices [facility]) lancets 1 each by Other route daily. Use as instructed 03/12/15   Glean Hess, MD  Lancets Misc. (ACCU-CHEK SOFTCLIX LANCET DEV) KIT 1 each by Does not apply route daily. 04/07/21   Glean Hess, MD  losartan-hydrochlorothiazide [facility]) 100-25 MG tablet TAKE 1 TABLET EVERY DAY 09/30/21   Glean Hess, MD  metFORMIN (GLUCOPHAGE-XR) 500 MG 24 hr tablet TAKE 1 TABLET EVERY DAY WITH BREAKFAST 09/30/21   Glean Hess, MD  metoprolol succinate (TOPROL-XL) 50 MG 24 hr tablet TAKE 3 TABLETS DAILY WITH OR IMMEDIATELY FOLLOWING A MEAL 09/30/21   Glean Hess, MD  mupirocin ointment (BACTROBAN) 2 % PLACE 1 APPLICATION INTO THE NOSE 2 TIMES A DAILY. 08/11/21   Glean Hess, MD  omeprazole (PRILOSEC) 20 MG capsule Take 1 capsule (20 mg total) by mouth daily. 07/23/21 10/21/21  Glean Hess, MD  simvastatin (ZOCOR) 40 MG tablet TAKE 1 TABLET AT BEDTIME 09/30/21   Glean Hess, MD  traMADol (ULTRAM) 50 MG tablet Take 1 tablet (50 mg total) by mouth every 6 (six) hours as needed. 11/10/21   Glean Hess, MD   vitamin C (ASCORBIC ACID) 500 MG tablet Take 500 mg by mouth daily.    [provider]  Vitamin D, Ergocalciferol, (DRISDOL) 1.25 MG (50000 UNIT) CAPS capsule Take 1 capsule (50,000 Units total) by mouth every 7 (seven) days. Take for 8 total doses(weeks) 08/11/21   Montel Culver, MD    Family History Family History  Problem Relation Age of Onset   CAD Father        died age 78   COPD Mother    Diabetes Maternal Grandmother    Breast cancer Neg Hx     Social History Social History   Tobacco Use   Smoking status: Former  Packs/day: 0.50    Years: 30.00    Total pack years: 15.00    Types: Cigarettes    Quit date: 08/04/2018    Years since quitting: 3.3   Smokeless tobacco: Never   Tobacco comments:    Patient declines Lung Cancer Screening.  Vaping Use   Vaping Use: Never used  Substance Use Topics   Alcohol use: Yes    Comment: rarely   Drug use: Never     Allergies   Glipizide   Review of Systems Review of Systems  HENT:  Negative for trouble swallowing.   Respiratory:  Negative for shortness of breath.   Skin:  Positive for color change, rash and wound.  Hematological: Negative.   Psychiatric/Behavioral: Negative.       Physical Exam Triage Vital Signs ED Triage Vitals [12/13/21 1559]  Enc Vitals Group     BP (!) 155/67     Pulse Rate 76     Resp 18     Temp 98.3 F (36.8 C)     Temp Source Oral     SpO2 99 %     Weight      Height      Head Circumference      Peak Flow      Pain Score      Pain Loc      Pain Edu?      Excl. in [place]?    No data found.  Updated Vital Signs BP (!) 155/67 (BP Location: Left Arm)   Pulse 76   Temp 98.3 F (36.8 C) (Oral)   Resp 18   LMP 09/27/1995   SpO2 99%   Visual Acuity Right Eye Distance:   Left Eye Distance:   Bilateral Distance:    Right Eye Near:   Left Eye Near:    Bilateral Near:     Physical Exam Vitals and nursing note reviewed.  Constitutional:      Appearance: Normal  appearance. She is not ill-appearing.  HENT:     Head: Normocephalic and atraumatic.  Cardiovascular:     Rate and Rhythm: Normal rate and regular rhythm.     Pulses: Normal pulses.     Heart sounds: Normal heart sounds. No murmur heard.    No friction rub. No gallop.  Pulmonary:     Effort: Pulmonary effort is normal.     Breath sounds: Normal breath sounds. No stridor. No wheezing, rhonchi or rales.  Skin:    General: Skin is warm.     Capillary Refill: Capillary refill takes less than 2 seconds.     Findings: Erythema and rash present.  Neurological:     General: No focal deficit present.     Mental Status: She is alert and oriented to person, place, and time.  Psychiatric:        Mood and Affect: Mood normal.        Behavior: Behavior normal.        Thought Content: Thought content normal.        Judgment: Judgment normal.      UC Treatments / Results  Labs (all labs ordered are listed, but only abnormal results are displayed) Labs Reviewed - No data to display  EKG   Radiology No results found.  Procedures Procedures (including critical care time)  Medications Ordered in UC Medications - No data to display  Initial Impression / Assessment and Plan / UC Course  I have reviewed the triage vital signs and the  nursing notes.  Pertinent labs & imaging results that were available during my care of the patient were reviewed by me and considered in my medical decision making (see chart for details).  She is a very pleasant, nontoxic-appearing 66 year old female here for evaluation of swelling and redness to the fingers of both hands.  Her thumbs are spared bilaterally.  She does have a lesion on the back of her left hand but not on her right.  The dorsal aspects of the fingers of both hands have various areas of excoriation and scabbing.  There is also little dry flaky skin with erythema and mild edema.  Patient has full range of motion sensation in her fingers.  There  is good cap refill of less than 2 seconds.  There is no warmth associated with the redness and I do not believe that her dermal inflammation is infected.  I will treat her for contact dermatitis with prednisone, Allegra, Benadryl, and Pepcid.  Return and ER precautions reviewed with patient.   Final Clinical Impressions(s) / UC Diagnoses   Final diagnoses:  Allergic contact dermatitis, unspecified trigger     Discharge Instructions      Take over-the-counter Allegra 180 mg daily or Zyrtec or Claritin 10 mg daily to help with your itching.  You can take over-the-counter Benadryl, 50 mg at bedtime, as needed for itching and sleep.  Take the prednisone pack according to the package instructions.  You will taken on tapering dose over a period of 6 days.  Take it with food and always take it first in the morning with breakfast.  Take over-the-counter Pepcid 20 mg twice daily to help with itching as well.  If you develop any swelling of your lips or tongue, tightness in your throat, or difficulty breathing you need to go to the ER for evaluation.      ED Prescriptions     Medication Sig Dispense Auth. Provider   predniSONE (STERAPRED UNI-PAK 21 TAB) 10 MG (21) TBPK tablet Take 6 tablets on day 1, 5 tablets day 2, 4 tablets day 3, 3 tablets day 4, 2 tablets day 5, 1 tablet day 6 21 tablet Margarette Canada, NP      PDMP not reviewed this encounter.   Margarette Canada, NP 12/13/21 1615

## 2021-12-13 NOTE — ED Triage Notes (Signed)
Pt reports rash all over x 2-3 days.

## 2021-12-13 NOTE — Discharge Instructions (Signed)
Take over-the-counter Allegra 180 mg daily or Zyrtec or Claritin 10 mg daily to help with your itching.  You can take over-the-counter Benadryl, 50 mg at bedtime, as needed for itching and sleep.  Take the prednisone pack according to the package instructions.  You will taken on tapering dose over a period of 6 days.  Take it with food and always take it first in the morning with breakfast.  Take over-the-counter Pepcid 20 mg twice daily to help with itching as well.  If you develop any swelling of your lips or tongue, tightness in your throat, or difficulty breathing you need to go to the ER for evaluation.  

## 2021-12-13 NOTE — Telephone Encounter (Signed)
  Chief Complaint: Hand have blisters and are itchy form Hand sanitizer Symptoms: ibid Frequency: Hand sanitizer from last week Pertinent Negatives: Patient denies Facial swelling, Widespread reaction Disposition: '[]'$ ED /'[x]'$ Urgent Care (no appt availability in office) / '[]'$ Appointment(In office/virtual)/ '[]'$  [place] [facility]/ '[]'$ Home Care/ '[]'$ Refused Recommended Disposition /'[]'$ [place] [facility]/ '[]'$  Follow-up with PCP Additional Notes: Pt  used a new hand sanitizer and it has caused itching and sores on her hands. Pt has used "Aquafor" without relief. Pt will go to UC.    Answer Assessment - Initial Assessment Questions 1. APPEARANCE of INJURY: "What does the injury look like?"      Last week 2. SIZE: "How large is the cut?"      Blisters from hand sanitizer 3. BLEEDING: "Is it bleeding now?" If Yes, ask: "Is it difficult to stop?"      Both hands 4. LOCATION: "Where is the injury located?"      palms 5. ONSET: "How long ago did the injury occur?"      Last week 6. MECHANISM: "Tell me how it happened."      Allergic reaction  7. TETANUS: "When was the last tetanus booster?"     na 8. PREGNANCY: "Is there any chance you are pregnant?" "When was your last menstrual period?"     na  Protocols used: Skin Injury-A-AH

## 2021-12-26 ENCOUNTER — Other Ambulatory Visit: Payer: Self-pay | Admitting: Internal Medicine

## 2021-12-26 DIAGNOSIS — M5116 Intervertebral disc disorders with radiculopathy, lumbar region: Secondary | ICD-10-CM

## 2021-12-26 DIAGNOSIS — M170 Bilateral primary osteoarthritis of knee: Secondary | ICD-10-CM

## 2022-01-17 ENCOUNTER — Other Ambulatory Visit: Payer: Self-pay | Admitting: Internal Medicine

## 2022-01-17 DIAGNOSIS — K219 Gastro-esophageal reflux disease without esophagitis: Secondary | ICD-10-CM

## 2022-01-18 NOTE — Telephone Encounter (Signed)
Requested medications are due for refill today.  yes  Requested medications are on the active medications list.  yes  Last refill. 07/23/2021 #90 1 refill  Future visit scheduled.   yes  Notes to clinic.  Rx expired 10/21/2021.    Requested Prescriptions  Pending Prescriptions Disp Refills   omeprazole (PRILOSEC) 20 MG capsule [Pharmacy Med Name: OMEPRAZOLE 20 MG Capsule Delayed Release] 90 capsule 1    Sig: TAKE [place] DAY     Gastroenterology: Proton Pump Inhibitors Passed - 01/17/2022  3:39 PM      Passed - Valid encounter within last 12 months    Recent Outpatient Visits           3 months ago Acute non-recurrent maxillary sinusitis   [facility] Glean Hess, MD   3 months ago Closed displaced fracture of fifth metatarsal bone of left foot with routine healing, subsequent encounter   [facility] Montel Culver, MD   5 months ago Displaced fracture of fifth metatarsal bone, left foot, initial encounter for closed fracture   [facility] Montel Culver, MD   5 months ago Essential (primary) hypertension   [facility] Glean Hess, MD   9 months ago Tinea corporis   [facility] Glean Hess, MD       Future Appointments             In 1 month Army Melia, Jesse Sans, MD [facility]

## 2022-01-19 ENCOUNTER — Ambulatory Visit (INDEPENDENT_AMBULATORY_CARE_PROVIDER_SITE_OTHER): Payer: Medicare HMO | Admitting: Internal Medicine

## 2022-01-19 ENCOUNTER — Encounter: Payer: Self-pay | Admitting: Internal Medicine

## 2022-01-19 VITALS — BP 132/80 | HR 73 | Ht 66.0 in | Wt 182.0 lb

## 2022-01-19 DIAGNOSIS — L03031 Cellulitis of right toe: Secondary | ICD-10-CM | POA: Diagnosis not present

## 2022-01-19 MED ORDER — AMOXICILLIN-POT CLAVULANATE 875-125 MG PO TABS: 1.0000 | ORAL_TABLET | Freq: Two times a day (BID) | ORAL | 0 refills | Status: AC

## 2022-01-19 NOTE — Progress Notes (Signed)
Date:  01/19/2022   Name:  Kiara Campbell   DOB:  Jan 17, 1956   MRN:  [id number]   Chief Complaint: Foot Pain (Patient hit her Great RT toe on concrete 2 mos ago and its still not heeling right. Said she dropped cooked rice on her foot two nights ago and her foot is also her hurting her. Toe nail is still actively bleeding today.)  Foot Pain This is a new problem. Episode onset: 2 months. The problem has been waxing and waning. Pertinent negatives include no arthralgias, chest pain, chills, coughing, fever, joint swelling, numbness or vertigo. Associated symptoms comments: Bleeding Right great toenail. The symptoms are aggravated by standing, walking and exertion.    Lab Results  Component Value Date   NA 142 07/23/2021   K 4.5 07/23/2021   CO2 26 07/23/2021   GLUCOSE 110 (H) 07/23/2021   BUN 30 (H) 07/23/2021   CREATININE 1.38 (H) 07/23/2021   CALCIUM 10.1 07/23/2021   EGFR 42 (L) 07/23/2021   GFRNONAA 50 (L) 03/09/2021   Lab Results  Component Value Date   CHOL 111 04/07/2020   HDL 44 04/07/2020   LDLCALC 39 04/07/2020   TRIG 171 (H) 04/07/2020   CHOLHDL 2.5 04/07/2020   Lab Results  Component Value Date   TSH 2.510 03/04/2019   Lab Results  Component Value Date   HGBA1C 6.5 (H) 07/23/2021   Lab Results  Component Value Date   WBC 11.6 (H) 07/23/2021   HGB 9.7 (L) 07/23/2021   HCT 28.6 (L) 07/23/2021   MCV 96 07/23/2021   PLT 323 07/23/2021   Lab Results  Component Value Date   ALT 20 07/23/2021   AST 18 07/23/2021   ALKPHOS 85 07/23/2021   BILITOT 0.3 07/23/2021   No results found for: "25OHVITD2", "25OHVITD3", "VD25OH"   Review of Systems  Constitutional:  Negative for chills and fever.  Respiratory:  Negative for cough.   Cardiovascular:  Negative for chest pain.  Musculoskeletal:  Negative for arthralgias, gait problem and joint swelling.  Skin:  Positive for color change and wound.  Neurological:  Negative for vertigo and numbness.    Patient  Active Problem List   Diagnosis Date Noted   Displaced fracture of fifth metatarsal bone, left foot, initial encounter for closed fracture 08/11/2021   Anorectal ulcer 07/23/2021   Esophageal dysphagia    Schatzki's ring    Gastric erythema    Hiatal hernia    Special screening for malignant neoplasms, colon    Hx of skin cancer, basal cell 11/19/2018   Tobacco use disorder, severe, in early remission 08/09/2017   Obesity (BMI 30-39.9) 07/17/2017   GERD (gastroesophageal reflux disease) 07/12/2017   Primary osteoarthritis of both knees 08/01/2016   Type II diabetes mellitus with complication ([place]) 65/46/1243   Fibrocystic breast disease 12/03/2014   Colon polyp 12/03/2014   Diverticulitis of colon 12/03/2014   Essential (primary) hypertension 12/03/2014   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   OP (osteoporosis) 12/03/2014   Hyperlipidemia associated with type 2 diabetes mellitus ([place]) 12/03/2014    Allergies  Allergen Reactions   Glipizide Nausea Only    Past Surgical History:  Procedure Laterality Date   ABDOMINAL HYSTERECTOMY     partial   ABDOMINAL HYSTERECTOMY     APPENDECTOMY     BREAST CYST ASPIRATION     neg not sure which side   COLONOSCOPY WITH PROPOFOL N/A 02/03/2021   Procedure: COLONOSCOPY WITH PROPOFOL;  Surgeon:  Virgel Manifold, MD;  Location: [facility] ENDOSCOPY;  Service: Endoscopy;  Laterality: N/A;   ESOPHAGOGASTRODUODENOSCOPY N/A 02/03/2021   Procedure: ESOPHAGOGASTRODUODENOSCOPY (EGD);  Surgeon: Virgel Manifold, MD;  Location: [facility] ENDOSCOPY;  Service: Endoscopy;  Laterality: N/A;   JOINT REPLACEMENT     LESION DESTRUCTION N/A 08/18/2021   Procedure: BIOPSY OF ANORECTAL ULCER;  Surgeon: Ileana Roup, MD;  Location: [place];  Service: General;  Laterality: N/A;   RECTAL PROLAPSE REPAIR     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   TONSILLECTOMY     VARICOSE VEIN SURGERY      Social History   Tobacco Use    Smoking status: Former    Packs/day: 0.50    Years: 30.00    Total pack years: 15.00    Types: Cigarettes    Quit date: 08/04/2018    Years since quitting: 3.4   Smokeless tobacco: Never   Tobacco comments:    Patient declines Lung Cancer Screening.  Vaping Use   Vaping Use: Never used  Substance Use Topics   Alcohol use: Yes    Comment: rarely   Drug use: Never     Medication list has been reviewed and updated.  Current Meds  Medication Sig   Alcohol Swabs (B-D SINGLE USE SWABS REGULAR) PADS USE DAILY.   allopurinol (ZYLOPRIM) 300 MG tablet TAKE 1 TABLET EVERY DAY   azelastine (ASTELIN) 0.1 % nasal spray Place 1 spray into both nostrils 2 (two) times daily. Use in each nostril as directed   Calcium Carb-Ergocalciferol 500-200 MG-UNIT TABS Take 1 tablet by mouth daily.   celecoxib (CELEBREX) 200 MG capsule TAKE 1 CAPSULE TWICE DAILY   colchicine (COLCRYS) 0.6 MG tablet Take 1 tablet (0.6 mg total) by mouth 2 (two) times daily.   fexofenadine (ALLEGRA) 60 MG tablet Take 60 mg by mouth 2 (two) times daily.   FIBER PO Take 1 capsule by mouth in the morning and at bedtime.   fluticasone (FLONASE) 50 MCG/ACT nasal spray Place 1 spray into both nostrils daily.   gabapentin (NEURONTIN) 100 MG capsule Take 2 capsules (200 mg total) by mouth 2 (two) times daily. (Patient taking differently: Take 100 mg by mouth 2 (two) times daily.)   glucose blood (TRUE METRIX BLOOD GLUCOSE TEST) test strip TEST EVERY DAY   Lancet Devices (ACCU-CHEK SOFTCLIX) lancets 1 each by Other route daily. Use as instructed   Lancets Misc. (ACCU-CHEK SOFTCLIX LANCET DEV) KIT 1 each by Does not apply route daily.   losartan-hydrochlorothiazide (HYZAAR) 100-25 MG tablet TAKE 1 TABLET EVERY DAY   metFORMIN (GLUCOPHAGE-XR) 500 MG 24 hr tablet TAKE 1 TABLET EVERY DAY WITH BREAKFAST   metoprolol succinate (TOPROL-XL) 50 MG 24 hr tablet TAKE 3 TABLETS DAILY WITH OR IMMEDIATELY FOLLOWING A MEAL   mupirocin ointment  (BACTROBAN) 2 % PLACE 1 APPLICATION INTO THE NOSE 2 TIMES A DAILY.   omeprazole (PRILOSEC) 20 MG capsule TAKE 1 CAPSULE EVERY DAY   predniSONE (STERAPRED UNI-PAK 21 TAB) 10 MG (21) TBPK tablet Take 6 tablets on day 1, 5 tablets day 2, 4 tablets day 3, 3 tablets day 4, 2 tablets day 5, 1 tablet day 6   simvastatin (ZOCOR) 40 MG tablet TAKE 1 TABLET AT BEDTIME   traMADol (ULTRAM) 50 MG tablet TAKE 1 TABLET EVERY 6 HOURS AS NEEDED   vitamin C (ASCORBIC ACID) 500 MG tablet Take 500 mg by mouth daily.   Vitamin D, Ergocalciferol, (DRISDOL) 1.25 MG (50000 UNIT) CAPS  capsule Take 1 capsule (50,000 Units total) by mouth every 7 (seven) days. Take for 8 total doses(weeks)       01/19/2022    3:04 PM 10/13/2021    2:58 PM 10/01/2021   11:09 AM 09/21/2021    3:33 PM  GAD 7 : Generalized Anxiety Score  Nervous, Anxious, on Edge 0 _0 Control/stop worrying 0 _1 Worry too much - different things 0 1 1 0  Trouble relaxing 0 0 1 1  Restless 0 0 0 1  Easily annoyed or irritable 0 _2 Afraid - awful might happen 0 0 0 1  Total GAD 7 Score 0 _3 Anxiety Difficulty Not difficult at all Somewhat difficult         01/19/2022    3:04 PM 10/13/2021    2:57 PM 10/01/2021   11:08 AM  Depression screen PHQ 2/9  Decreased Interest _4 Down, Depressed, Hopeless _5 PHQ - 2 Score _6 Altered sleeping _7 Tired, decreased energy _8 Change in appetite 0 0 0  Feeling bad or failure about yourself  0 0 1  Trouble concentrating 0 0 0  Moving slowly or fidgety/restless 0 0 0  Suicidal thoughts 0 0 0  PHQ-9 Score _9 Difficult doing work/chores Not difficult at all Not difficult at all Not difficult at all    BP Readings from Last 3 Encounters:  01/19/22 132/80  12/13/21 (!) 155/67  10/01/21 136/78    Physical Exam Vitals and nursing note reviewed.  Constitutional:      General: She is not in acute distress.    Appearance: She is well-developed.  HENT:     Head:  Normocephalic and atraumatic.  Pulmonary:     Effort: Pulmonary effort is normal. No respiratory distress.  Musculoskeletal:     Right foot: Tenderness (great toe with soft tissue swelling and mild erythema) present.     Comments: Great toe nail on right - thick with dried blood around the periphery. Nail intact - not loose  Skin:    General: Skin is warm and dry.     Findings: No rash.  Neurological:     Mental Status: She is alert and oriented to person, place, and time.  Psychiatric:        Mood and Affect: Mood normal.        Behavior: Behavior normal.     Wt Readings from Last 3 Encounters:  01/19/22 182 lb (82.6 kg)  10/13/21 187 lb (84.8 kg)  10/01/21 187 lb (84.8 kg)    BP 132/80   Pulse 73   Ht 5' 6" (1.676 m)   Wt 182 lb (82.6 kg)   LMP 09/27/1995   SpO2 96%   BMI 29.38 kg/m   Assessment and Plan: 1. Cellulitis of toe of right foot Continue Betadine soaks to reduce bacterial burden Follow up if no improvement - amoxicillin-clavulanate (AUGMENTIN) 875-125 MG tablet; Take 1 tablet by mouth 2 (two) times daily for 10 days.  Dispense: 20 tablet; Refill: 0   Partially dictated using Editor, commissioning. Any errors are unintentional.  Halina Maidens, MD [facility]  01/19/2022

## 2022-02-01 ENCOUNTER — Other Ambulatory Visit: Payer: Self-pay | Admitting: Internal Medicine

## 2022-02-01 DIAGNOSIS — M170 Bilateral primary osteoarthritis of knee: Secondary | ICD-10-CM

## 2022-02-01 DIAGNOSIS — M5116 Intervertebral disc disorders with radiculopathy, lumbar region: Secondary | ICD-10-CM

## 2022-02-02 NOTE — Telephone Encounter (Signed)
Requested medication (s) are due for refill today - no  Requested medication (s) are on the active medication list -no  Future visit scheduled -yes  Last refill: 12/28/21  Notes to clinic: non delegated RF- medication no longer on current medication list  Requested Prescriptions  Pending Prescriptions Disp Refills   traMADol (ULTRAM) 50 MG tablet [Pharmacy Med Name: TRAMADOL HYDROCHLORIDE 50 MG Tablet] 60 tablet     Sig: TAKE 1 TABLET EVERY 6 HOURS AS NEEDED     Not Delegated - Analgesics:  Opioid Agonists Failed - 02/01/2022 11:27 AM      Failed - This refill cannot be delegated      Failed - Urine Drug Screen completed in last 360 days      Passed - Valid encounter within last 3 months    Recent Outpatient Visits           2 weeks ago Cellulitis of toe of right foot   [facility] Glean Hess, MD   4 months ago Acute non-recurrent maxillary sinusitis   [facility] Glean Hess, MD   4 months ago Closed displaced fracture of fifth metatarsal bone of left foot with routine healing, subsequent encounter   [facility] Montel Culver, MD   5 months ago Displaced fracture of fifth metatarsal bone, left foot, initial encounter for closed fracture   [facility] Montel Culver, MD   6 months ago Essential (primary) hypertension   [facility] Glean Hess, MD       Future Appointments             In 1 month Glean Hess, MD [facility]               Requested Prescriptions  Pending Prescriptions Disp Refills   traMADol (ULTRAM) 50 MG tablet [Pharmacy Med Name: TRAMADOL HYDROCHLORIDE 50 MG Tablet] 60 tablet     Sig: TAKE 1 TABLET EVERY 6 HOURS AS NEEDED     Not Delegated - Analgesics:  Opioid Agonists Failed - 02/01/2022 11:27 AM      Failed - This refill cannot be delegated      Failed - Urine Drug Screen completed in last 360 days      Passed - Valid encounter within last 3  months    Recent Outpatient Visits           2 weeks ago Cellulitis of toe of right foot   [facility] Glean Hess, MD   4 months ago Acute non-recurrent maxillary sinusitis   [facility] Glean Hess, MD   4 months ago Closed displaced fracture of fifth metatarsal bone of left foot with routine healing, subsequent encounter   [facility] Montel Culver, MD   5 months ago Displaced fracture of fifth metatarsal bone, left foot, initial encounter for closed fracture   [facility] Montel Culver, MD   6 months ago Essential (primary) hypertension   [facility] Glean Hess, MD       Future Appointments             In 1 month Army Melia, Jesse Sans, MD [facility], [facility]

## 2022-02-02 NOTE — Telephone Encounter (Signed)
Please review. Last office visit 01/19/22.  KP

## 2022-02-03 NOTE — Telephone Encounter (Signed)
Called pt left VM to call back.  KP 

## 2022-02-04 NOTE — Telephone Encounter (Signed)
Called pt could not leave VM.  KP

## 2022-03-15 ENCOUNTER — Ambulatory Visit
Admission: RE | Admit: 2022-03-15 | Discharge: 2022-03-15 | Disposition: A | Discharge status: Home / Self Care | Payer: Medicare HMO | Attending: Internal Medicine | Admitting: Internal Medicine

## 2022-03-15 ENCOUNTER — Encounter: Payer: Self-pay | Admitting: Internal Medicine

## 2022-03-15 ENCOUNTER — Other Ambulatory Visit
Admission: RE | Admit: 2022-03-15 | Discharge: 2022-03-15 | Disposition: A | Discharge status: Home / Self Care | Payer: Medicare HMO | Source: Home / Self Care | Attending: Internal Medicine | Admitting: Internal Medicine

## 2022-03-15 ENCOUNTER — Ambulatory Visit
Admission: RE | Admit: 2022-03-15 | Discharge: 2022-03-15 | Disposition: A | Discharge status: Home / Self Care | Payer: Medicare HMO | Source: Ambulatory Visit | Attending: Internal Medicine | Admitting: Internal Medicine

## 2022-03-15 ENCOUNTER — Ambulatory Visit (INDEPENDENT_AMBULATORY_CARE_PROVIDER_SITE_OTHER): Payer: Medicare HMO | Admitting: Internal Medicine

## 2022-03-15 VITALS — BP 134/70 | HR 67 | Ht 66.0 in | Wt 180.6 lb

## 2022-03-15 DIAGNOSIS — Z1231 Encounter for screening mammogram for malignant neoplasm of breast: Secondary | ICD-10-CM

## 2022-03-15 DIAGNOSIS — S8992XA Unspecified injury of left lower leg, initial encounter: Secondary | ICD-10-CM | POA: Diagnosis not present

## 2022-03-15 DIAGNOSIS — F17201 Nicotine dependence, unspecified, in remission: Secondary | ICD-10-CM

## 2022-03-15 DIAGNOSIS — E785 Hyperlipidemia, unspecified: Secondary | ICD-10-CM | POA: Diagnosis not present

## 2022-03-15 DIAGNOSIS — M170 Bilateral primary osteoarthritis of knee: Secondary | ICD-10-CM

## 2022-03-15 DIAGNOSIS — M1712 Unilateral primary osteoarthritis, left knee: Secondary | ICD-10-CM | POA: Diagnosis not present

## 2022-03-15 DIAGNOSIS — M5116 Intervertebral disc disorders with radiculopathy, lumbar region: Secondary | ICD-10-CM

## 2022-03-15 DIAGNOSIS — Z0000 Encounter for general adult medical examination without abnormal findings: Secondary | ICD-10-CM

## 2022-03-15 DIAGNOSIS — E118 Type 2 diabetes mellitus with unspecified complications: Secondary | ICD-10-CM | POA: Diagnosis not present

## 2022-03-15 DIAGNOSIS — Z23 Encounter for immunization: Secondary | ICD-10-CM

## 2022-03-15 DIAGNOSIS — E1169 Type 2 diabetes mellitus with other specified complication: Secondary | ICD-10-CM

## 2022-03-15 DIAGNOSIS — R1319 Other dysphagia: Secondary | ICD-10-CM

## 2022-03-15 DIAGNOSIS — I10 Essential (primary) hypertension: Secondary | ICD-10-CM | POA: Insufficient documentation

## 2022-03-15 LAB — COMPREHENSIVE METABOLIC PANEL
ALT: 25 U/L (ref 0–44)
AST: 23 U/L (ref 15–41)
Albumin: 4.4 g/dL (ref 3.5–5.0)
Alkaline Phosphatase: 79 U/L (ref 38–126)
Anion gap: 9 (ref 5–15)
BUN: 32 mg/dL — ABNORMAL HIGH (ref 8–23)
CO2: 26 mmol/L (ref 22–32)
Calcium: 9.8 mg/dL (ref 8.9–10.3)
Chloride: 105 mmol/L (ref 98–111)
Creatinine, Ser: 1.14 mg/dL — ABNORMAL HIGH (ref 0.44–1.00)
GFR, Estimated: 52 mL/min — ABNORMAL LOW (ref >60)
Glucose, Bld: 117 mg/dL — ABNORMAL HIGH (ref 70–99)
Potassium: 4.2 mmol/L (ref 3.5–5.1)
Sodium: 140 mmol/L (ref 135–145)
Total Bilirubin: 0.7 mg/dL (ref 0.3–1.2)
Total Protein: 8.1 g/dL (ref 6.5–8.1)

## 2022-03-15 LAB — URINALYSIS, ROUTINE W REFLEX MICROSCOPIC
Bilirubin Urine: NEGATIVE
Glucose, UA: NEGATIVE mg/dL
Hgb urine dipstick: NEGATIVE
Ketones, ur: NEGATIVE mg/dL
Leukocytes,Ua: NEGATIVE
Nitrite: NEGATIVE
Protein, ur: NEGATIVE mg/dL
Specific Gravity, Urine: 1.02 (ref 1.005–1.030)
pH: 5 (ref 5.0–8.0)

## 2022-03-15 LAB — CBC WITH DIFFERENTIAL/PLATELET
Abs Immature Granulocytes: 0.03 10*3/uL (ref 0.00–0.07)
Basophils Absolute: 0.1 10*3/uL (ref 0.0–0.1)
Basophils Relative: 1 %
Eosinophils Absolute: 0.3 10*3/uL (ref 0.0–0.5)
Eosinophils Relative: 4 %
HCT: 32 % — ABNORMAL LOW (ref 36.0–46.0)
Hemoglobin: 10.4 g/dL — ABNORMAL LOW (ref 12.0–15.0)
Immature Granulocytes: 0 %
Lymphocytes Relative: 34 %
Lymphs Abs: 2.8 10*3/uL (ref 0.7–4.0)
MCH: 31.8 pg (ref 26.0–34.0)
MCHC: 32.5 g/dL (ref 30.0–36.0)
MCV: 97.9 fL (ref 80.0–100.0)
Monocytes Absolute: 1.1 10*3/uL — ABNORMAL HIGH (ref 0.1–1.0)
Monocytes Relative: 13 %
Neutro Abs: 4 10*3/uL (ref 1.7–7.7)
Neutrophils Relative %: 48 %
Platelets: 288 10*3/uL (ref 150–400)
RBC: 3.27 MIL/uL — ABNORMAL LOW (ref 3.87–5.11)
RDW: 20.6 % — ABNORMAL HIGH (ref 11.5–15.5)
WBC: 8.3 10*3/uL (ref 4.0–10.5)
nRBC: 0 % (ref 0.0–0.2)

## 2022-03-15 LAB — LIPID PANEL
Cholesterol: 109 mg/dL (ref 0–200)
HDL: 42 mg/dL (ref >40)
LDL Cholesterol: 55 mg/dL (ref 0–99)
Total CHOL/HDL Ratio: 2.6 RATIO
Triglycerides: 62 mg/dL (ref <150)
VLDL: 12 mg/dL (ref 0–40)

## 2022-03-15 LAB — HEMOGLOBIN A1C
Hgb A1c MFr Bld: 6.5 % — ABNORMAL HIGH (ref 4.8–5.6)
Mean Plasma Glucose: 139.85 mg/dL

## 2022-03-15 LAB — TSH: TSH: 1.797 u[IU]/mL (ref 0.350–4.500)

## 2022-03-15 MED ORDER — TRAMADOL HCL 50 MG PO TABS: 50.0000 mg | ORAL_TABLET | Freq: Four times a day (QID) | ORAL | 0 refills | Status: DC | PRN

## 2022-03-15 NOTE — Progress Notes (Signed)
Date:  03/15/2022   Name:  Kiara Campbell   DOB:  1956/06/21   MRN:  [id number]   Chief Complaint: Annual Exam (Breast Exam, Foot Exam. Requested last eye exam from [facility].) and Immunizations (FLU SHOT) Kiara Campbell is a 66 y.o. female who presents today for her Complete Annual Exam. She feels fairly well. She reports exercising - none. She reports she is sleeping well. Breast complaints - none.  Mammogram: 04/2021 DEXA: 05/2019 ordered 09/2021 Colonoscopy: 02/2021  Health Maintenance Due  Topic Date Due   OPHTHALMOLOGY EXAM  10/22/2016   HEMOGLOBIN A1C  01/20/2022    Immunization History  Administered Date(s) Administered   Fluad Quad(high Dose 65+) 03/04/2019, 04/07/2020, 03/09/2021, 03/15/2022   Influenza,inj,Quad PF,6+ Mos 03/10/2015, 04/26/2016, 04/25/2017, 04/20/2018   Influenza-Unspecified 04/12/2017   Moderna Covid Bivalent Peds Booster(37mo Thru 48yrs) 07/08/2021   Moderna Sars-Covid-2 Vaccination 08/14/2019, 09/11/2019, 06/03/2020   Pneumococcal Conjugate-13 01/28/2016   Pneumococcal Polysaccharide-23 05/12/1999, 07/05/2008, 11/19/2018   Tdap 10/06/2021   Zoster Recombinat (Shingrix) 07/08/2021, 10/06/2021   Zoster, Live 07/05/2012   Tobacco use - she qualifies for LDCT screening.  Has 20 pk yr hx and quit in 2020.  She declines.  Hypertension This is a chronic problem. The problem is controlled. Pertinent negatives include no chest pain, headaches, palpitations or shortness of breath. Past treatments include angiotensin blockers, beta blockers and diuretics. The current treatment provides significant improvement. Hypertensive end-organ damage includes kidney disease. There is no history of CAD/MI or CVA.  Diabetes She presents for her follow-up diabetic visit. She has type 2 diabetes mellitus. Her disease course has been stable. Pertinent negatives for hypoglycemia include no dizziness, headaches, nervousness/anxiousness or tremors. Pertinent negatives  for diabetes include no chest pain, no fatigue, no foot paresthesias, no polydipsia, no polyuria, no visual change and no weight loss. Symptoms are stable. Pertinent negatives for diabetic complications include no CVA. Current diabetic treatment includes oral agent (monotherapy) (metformin). An ACE inhibitor/angiotensin II receptor blocker is being taken. She does not see a podiatrist.Eye exam is not current.  Hyperlipidemia This is a chronic problem. The problem is controlled. Pertinent negatives include no chest pain or shortness of breath. Current antihyperlipidemic treatment includes statins.  Gastroesophageal Reflux She complains of dysphagia and heartburn. She reports no abdominal pain, no chest pain, no coughing or no wheezing. This is a recurrent problem. Pertinent negatives include no fatigue or weight loss. She has tried a PPI for the symptoms.  Knee Pain  There was no injury mechanism. The pain is present in the left knee. The quality of the pain is described as aching and burning. The pain is moderate. The pain has been Fluctuating since onset.    Lab Results  Component Value Date   NA 142 07/23/2021   K 4.5 07/23/2021   CO2 26 07/23/2021   GLUCOSE 110 (H) 07/23/2021   BUN 30 (H) 07/23/2021   CREATININE 1.38 (H) 07/23/2021   CALCIUM 10.1 07/23/2021   EGFR 42 (L) 07/23/2021   GFRNONAA 50 (L) 03/09/2021   Lab Results  Component Value Date   CHOL 111 04/07/2020   HDL 44 04/07/2020   LDLCALC 39 04/07/2020   TRIG 171 (H) 04/07/2020   CHOLHDL 2.5 04/07/2020   Lab Results  Component Value Date   TSH 2.510 03/04/2019   Lab Results  Component Value Date   HGBA1C 6.5 (H) 07/23/2021   Lab Results  Component Value Date   WBC 11.6 (H) 07/23/2021  HGB 9.7 (L) 07/23/2021   HCT 28.6 (L) 07/23/2021   MCV 96 07/23/2021   PLT 323 07/23/2021   Lab Results  Component Value Date   ALT 20 07/23/2021   AST 18 07/23/2021   ALKPHOS 85 07/23/2021   BILITOT 0.3 07/23/2021   No  results found for: "25OHVITD2", "25OHVITD3", "VD25OH"   Review of Systems  Constitutional:  Negative for chills, fatigue, fever and weight loss.  HENT:  Negative for congestion, hearing loss, tinnitus, trouble swallowing and voice change.   Eyes:  Negative for visual disturbance.  Respiratory:  Negative for cough, chest tightness, shortness of breath and wheezing.   Cardiovascular:  Negative for chest pain, palpitations and leg swelling.  Gastrointestinal:  Positive for dysphagia and heartburn. Negative for abdominal pain, constipation, diarrhea and vomiting.  Endocrine: Negative for polydipsia and polyuria.  Genitourinary:  Negative for dysuria, frequency, genital sores, vaginal bleeding and vaginal discharge.  Musculoskeletal:  Positive for arthralgias, back pain and gait problem. Negative for joint swelling.  Skin:  Negative for color change and rash.  Neurological:  Negative for dizziness, tremors, light-headedness and headaches.  Hematological:  Negative for adenopathy. Does not bruise/bleed easily.  Psychiatric/Behavioral:  Negative for dysphoric mood and sleep disturbance. The patient is not nervous/anxious.     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   Tobacco use disorder, severe, in early remission 08/09/2017   Obesity (BMI 30-39.9) 07/17/2017   GERD (gastroesophageal reflux disease) 07/12/2017   Primary osteoarthritis of both knees 08/01/2016   Type II diabetes mellitus with complication ([place]) 72/53/6644   Fibrocystic breast disease 12/03/2014   Colon polyp 12/03/2014   Diverticulitis of colon 12/03/2014   Essential (primary) hypertension 12/03/2014    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   OP (osteoporosis) 12/03/2014   Hyperlipidemia associated with type 2 diabetes mellitus ([place]) 12/03/2014    Allergies  Allergen Reactions   Glipizide Nausea Only    Past Surgical History:  Procedure Laterality Date   ABDOMINAL HYSTERECTOMY     partial   ABDOMINAL HYSTERECTOMY     APPENDECTOMY     BREAST CYST ASPIRATION     neg not sure which side   COLONOSCOPY WITH PROPOFOL N/A 02/03/2021   Procedure: COLONOSCOPY WITH PROPOFOL;  Surgeon: Virgel Manifold, MD;  Location: [facility] ENDOSCOPY;  Service: Endoscopy;  Laterality: N/A;   ESOPHAGOGASTRODUODENOSCOPY N/A 02/03/2021   Procedure: ESOPHAGOGASTRODUODENOSCOPY (EGD);  Surgeon: Virgel Manifold, MD;  Location: [facility] ENDOSCOPY;  Service: Endoscopy;  Laterality: N/A;   JOINT REPLACEMENT     LESION DESTRUCTION N/A 08/18/2021   Procedure: BIOPSY OF ANORECTAL ULCER;  Surgeon: Ileana Roup, MD;  Location: [place];  Service: General;  Laterality: N/A;   RECTAL PROLAPSE REPAIR     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   TONSILLECTOMY     VARICOSE VEIN SURGERY      Social History   Tobacco Use   Smoking status: Former    Packs/day: 0.50    Years: 30.00    Total pack years: 15.00    Types: Cigarettes    Quit date: 08/04/2018    Years since quitting:  3.6   Smokeless tobacco: Never   Tobacco comments:    Patient declines Lung Cancer Screening.  Vaping Use   Vaping Use: Never used  Substance Use Topics   Alcohol use: Yes    Comment: rarely   Drug use: Never     Medication list has been reviewed and updated.  Current Meds  Medication Sig   Alcohol Swabs (B-D SINGLE USE SWABS REGULAR) PADS USE DAILY.   allopurinol (ZYLOPRIM) 300 MG tablet TAKE 1 TABLET EVERY DAY   azelastine (ASTELIN) 0.1 % nasal spray Place 1 spray into both nostrils 2 (two) times daily. Use in each nostril as directed   Calcium Carb-Ergocalciferol 500-200 MG-UNIT TABS Take 1  tablet by mouth daily.   celecoxib (CELEBREX) 200 MG capsule TAKE 1 CAPSULE TWICE DAILY   colchicine (COLCRYS) 0.6 MG tablet Take 1 tablet (0.6 mg total) by mouth 2 (two) times daily.   fexofenadine (ALLEGRA) 60 MG tablet Take 60 mg by mouth 2 (two) times daily.   FIBER PO Take 1 capsule by mouth in the morning and at bedtime.   fluticasone (FLONASE) 50 MCG/ACT nasal spray Place 1 spray into both nostrils daily.   glucose blood (TRUE METRIX BLOOD GLUCOSE TEST) test strip TEST EVERY DAY   Lancet Devices (ACCU-CHEK SOFTCLIX) lancets 1 each by Other route daily. Use as instructed   Lancets Misc. (ACCU-CHEK SOFTCLIX LANCET DEV) KIT 1 each by Does not apply route daily.   losartan-hydrochlorothiazide (HYZAAR) 100-25 MG tablet TAKE 1 TABLET EVERY DAY   metFORMIN (GLUCOPHAGE-XR) 500 MG 24 hr tablet TAKE 1 TABLET EVERY DAY WITH BREAKFAST   metoprolol succinate (TOPROL-XL) 50 MG 24 hr tablet TAKE 3 TABLETS DAILY WITH OR IMMEDIATELY FOLLOWING A MEAL   mupirocin ointment (BACTROBAN) 2 % PLACE 1 APPLICATION INTO THE NOSE 2 TIMES A DAILY.   omeprazole (PRILOSEC) 20 MG capsule TAKE 1 CAPSULE EVERY DAY   simvastatin (ZOCOR) 40 MG tablet TAKE 1 TABLET AT BEDTIME   vitamin C (ASCORBIC ACID) 500 MG tablet Take 500 mg by mouth daily.   Vitamin D, Ergocalciferol, (DRISDOL) 1.25 MG (50000 UNIT) CAPS capsule Take 1 capsule (50,000 Units total) by mouth every 7 (seven) days. Take for 8 total doses(weeks)       03/15/2022   10:47 AM 01/19/2022    3:04 PM 10/13/2021    2:58 PM 10/01/2021   11:09 AM  GAD 7 : Generalized Anxiety Score  Nervous, Anxious, on Edge 1 0 2 2  Control/stop worrying 0 0 2 1  Worry too much - different things 0 0 1 1  Trouble relaxing 2 0 0 1  Restless 2 0 0 0  Easily annoyed or irritable 2 0 1 1  Afraid - awful might happen 0 0 0 0  Total GAD 7 Score 7 0 6 6  Anxiety Difficulty Somewhat difficult Not difficult at all Somewhat difficult        03/15/2022   10:47 AM 01/19/2022    3:04  PM 10/13/2021    2:57 PM  Depression screen PHQ 2/9  Decreased Interest 1 1 1   Down, Depressed, Hopeless 1 1 1   PHQ - 2 Score 2 2 2   Altered sleeping 2 2 2   Tired, decreased energy 3 3 2   Change in appetite 1 0 0  Feeling bad or failure about yourself  1 0 0  Trouble concentrating 0 0 0  Moving slowly or fidgety/restless 1 0 0  Suicidal thoughts 0 0 0  PHQ-9  Score '10 7 6  '$ Difficult doing work/chores Not difficult at all Not difficult at all Not difficult at all    BP Readings from Last 3 Encounters:  03/15/22 134/70  01/19/22 132/80  12/13/21 (!) 155/67    Physical Exam Vitals and nursing note reviewed.  Constitutional:      General: She is not in acute distress.    Appearance: She is well-developed.  HENT:     Head: Normocephalic and atraumatic.     Right Ear: Tympanic membrane and ear canal normal.     Left Ear: Tympanic membrane and ear canal normal.     Nose:     Right Sinus: No maxillary sinus tenderness.     Left Sinus: No maxillary sinus tenderness.  Eyes:     General: No scleral icterus.       Right eye: No discharge.        Left eye: No discharge.     Conjunctiva/sclera: Conjunctivae normal.  Neck:     Thyroid: No thyromegaly.     Vascular: No carotid bruit.  Cardiovascular:     Rate and Rhythm: Normal rate and regular rhythm.     Pulses: Normal pulses.     Heart sounds: Normal heart sounds.  Pulmonary:     Effort: Pulmonary effort is normal. No respiratory distress.     Breath sounds: No wheezing.  Chest:  Breasts:    Right: No mass, nipple discharge, skin change or tenderness.     Left: No mass, nipple discharge, skin change or tenderness.  Abdominal:     General: Bowel sounds are normal.     Palpations: Abdomen is soft.     Tenderness: There is no abdominal tenderness.  Musculoskeletal:     Cervical back: Normal range of motion. No erythema.     Left knee: No swelling, deformity, effusion or erythema. Normal range of motion. Tenderness present  over the medial joint line.     Right lower leg: No edema.     Left lower leg: No edema.  Lymphadenopathy:     Cervical: No cervical adenopathy.  Skin:    General: Skin is warm and dry.     Findings: No rash.  Neurological:     Mental Status: She is alert and oriented to person, place, and time.     Cranial Nerves: No cranial nerve deficit.     Sensory: No sensory deficit.     Deep Tendon Reflexes: Reflexes are normal and symmetric.  Psychiatric:        Attention and Perception: Attention normal.        Mood and Affect: Mood normal.     Wt Readings from Last 3 Encounters:  03/15/22 180 lb 9.6 oz (81.9 kg)  01/19/22 182 lb (82.6 kg)  10/13/21 187 lb (84.8 kg)    BP 134/70   Pulse 67   Ht $R'5\' 6"'mc$  (1.676 m)   Wt 180 lb 9.6 oz (81.9 kg)   LMP 09/27/1995   SpO2 97%   BMI 29.15 kg/m   Assessment and Plan: 1. Annual physical exam Normal exam. Up to date on screenings and immunizations. Continue healthy diet, exercise as able.  2. Encounter for screening mammogram for breast cancer Schedule in October with DEXA at [place]  3. Essential (primary) hypertension Clinically stable exam with well controlled BP. Tolerating medications without side effects at this time. Pt to continue current regimen and low sodium diet;  - CBC with Differential/Platelet -  TSH - Urinalysis, Routine w reflex microscopic  4. Esophageal dysphagia No red flag signs noted Continue PPI - CBC with Differential/Platelet  5. Hyperlipidemia associated with type 2 diabetes mellitus ([place]) Tolerating statin medication without side effects at this time LDL is at goal of < 70 on current dose Continue same therapy without change at this time. - Lipid panel  6. Type II diabetes mellitus with complication ([facility]) Clinically stable by exam and report without s/s of hypoglycemia. DM complicated by hypertension and dyslipidemia. Tolerating medications well without side  effects or other concerns. Eye exam requested. - Comprehensive metabolic panel - Hemoglobin A1c  7. Tobacco use disorder, severe, in early remission Qualifies for LDTC but declines  8. Lumbar disc herniation with radiculopathy Managed with tramadol bid  - traMADol (ULTRAM) 50 MG tablet; Take 1 tablet (50 mg total) by mouth every 6 (six) hours as needed.  Dispense: 60 tablet; Refill: 0  9. Arthritis of left knee Has had injections back in 2019 by Ortho Would like to see Dr. Zigmund Daniel here. - DG Knee Complete 4 Views Left; Future   Partially dictated using Editor, commissioning. Any errors are unintentional.  Halina Maidens, MD [facility]  03/15/2022

## 2022-03-22 ENCOUNTER — Inpatient Hospital Stay (INDEPENDENT_AMBULATORY_CARE_PROVIDER_SITE_OTHER): Payer: Medicare HMO | Admitting: Radiology

## 2022-03-22 ENCOUNTER — Ambulatory Visit (INDEPENDENT_AMBULATORY_CARE_PROVIDER_SITE_OTHER): Payer: Medicare HMO | Admitting: Family Medicine

## 2022-03-22 ENCOUNTER — Encounter: Payer: Self-pay | Admitting: Family Medicine

## 2022-03-22 VITALS — BP 130/88 | HR 88 | Ht 66.0 in | Wt 180.0 lb

## 2022-03-22 DIAGNOSIS — M1711 Unilateral primary osteoarthritis, right knee: Secondary | ICD-10-CM

## 2022-03-22 DIAGNOSIS — M1712 Unilateral primary osteoarthritis, left knee: Secondary | ICD-10-CM | POA: Diagnosis not present

## 2022-03-22 MED ORDER — TRIAMCINOLONE ACETONIDE 40 MG/ML IJ SUSP
40.0000 mg | Freq: Once | INTRAMUSCULAR | Status: AC
  Administered 2022-03-22: 40 mg via INTRAMUSCULAR

## 2022-03-22 NOTE — Progress Notes (Addendum)
Primary Care / Sports Medicine Office Visit  Patient Information:  Patient ID: Kiara Campbell, female DOB: 27-Sep-1955 Age: 66 y.o. MRN: [id number]   Kiara Campbell is a pleasant 66 y.o. female presenting with the following:  Chief Complaint  Patient presents with   Knee Pain    Left, got a cortisone injection 6 years ago, has just now started to hurt.     Vitals:   03/22/22 1528  BP: 130/88  Pulse: 88  SpO2: 99%   Vitals:   03/22/22 1528  Weight: 180 lb (81.6 kg)  Height: '5\' 6"'$  (1.676 m)   Body mass index is 29.05 kg/m.     Independent interpretation of notes and tests performed by another provider:   Independent interpretation of left knee x-rays dated 03/15/2022 reveal moderate to severe osteoarthritis primarily involving the medial tibiofemoral compartment where there is joint space loss, cortical roughening at the periphery with osteophyte formation, secondary moderate involvement at the patellofemoral articulation where there is chondrocalcinosis and superior and inferior osteophyte presents, no acute osseous processes noted  Procedures performed:   Procedure:  Injection of Left  knee under ultrasound guidance. Ultrasound guidance utilized for anteromedial approach, joint space narrowing noted sonographically without effusion Samsung HS60 device utilized with permanent recording / reporting. Verbal informed consent obtained and verified. Skin prepped in a sterile fashion. Ethyl chloride for topical local analgesia.  Completed without difficulty and tolerated well. Medication: triamcinolone acetonide 40 mg/mL suspension for injection 1 mL total and 2 mL lidocaine 1% without epinephrine utilized for needle placement anesthetic Advised to contact for fevers/chills, erythema, induration, drainage, or persistent bleeding.   Pertinent History, Exam, Impression, and Recommendations:   Problem List Items Addressed This Visit       Musculoskeletal and Integument    Primary osteoarthritis of left knee - Primary    Patient with known longstanding history of osteoarthritis, has been addressed roughly 6 years prior with corticosteroid injection with essential full symptom control until lately.  She states that roughly 2-3 weeks prior she was cleaning out her mother's home involving significant time on her feet, began to note progressively worsening anteromedial pain, no swelling, no buckling/near buckling, no mechanical symptoms are relayed.  She has been dosing Celebrex and tramadol without adequate relief.  Examination reveals diffuse subtle soft tissue swelling without overt effusion, range of motion from 0-110, limited by anterior pain, there is crepitus, tenderness maximally at the medial tibiofemoral joint line, secondarily to the inferior aspect of the medial patellar facet, no laxity with anterior/posterior, valgus/varus stressing, McMurray's benign.  Given her treatments to date, findings today both on exam and radiographically, did discuss options and she elected to proceed with Left knee intra-articular corticosteroid injection under ultrasound guidance.  Post care reviewed, she can continue her oral medications until symptoms respond to cortisone.  As she has upcoming further physical commitments, did discuss hinged knee brace for periods of increased physical activity/prolonged weightbearing.  She can otherwise follow-up as needed, if symptoms were to recur she would be a good candidate for Zilretta and/or viscosupplementation.      Relevant Orders   [place] LIMITED JOINT SPACE STRUCTURES LOW RIGHT (Completed)     Orders & Medications Meds ordered this encounter  Medications   triamcinolone acetonide (KENALOG-40) injection 40 mg   Orders Placed This Encounter  Procedures   [place] LIMITED JOINT SPACE STRUCTURES LOW RIGHT     Return if symptoms worsen or fail to improve.  Montel Culver, MD   Primary Care Sports Medicine [place]

## 2022-03-22 NOTE — Patient Instructions (Addendum)
You have just been given a cortisone injection to reduce pain and inflammation. After the injection you may notice immediate relief of pain as a result of the Lidocaine. It is important to rest the area of the injection for 24 to 48 hours after the injection. There is a possibility of some temporary increased discomfort and swelling for up to 72 hours until the cortisone begins to work. If you do have pain, simply rest the joint and use ice. If you can tolerate over the counter medications, you can try Tylenol for added relief per package instructions. -As above, relative rest x2 days and gradual return to normal activity - Can continue your previously prescribed Celebrex and tramadol on a as needed basis for knee pain until symptoms respond to cortisone - Use hinged knee brace during periods of increased and/or strenuous activity as well as prolonged time on feet - Contact us for any questions and follow-up as needed

## 2022-03-22 NOTE — Assessment & Plan Note (Addendum)
Patient with known longstanding history of osteoarthritis, has been addressed roughly 6 years prior with corticosteroid injection with essential full symptom control until lately.  She states that roughly 2-3 weeks prior she was cleaning out her mother's home involving significant time on her feet, began to note progressively worsening anteromedial pain, no swelling, no buckling/near buckling, no mechanical symptoms are relayed.  She has been dosing Celebrex and tramadol without adequate relief.  Examination reveals diffuse subtle soft tissue swelling without overt effusion, range of motion from 0-110, limited by anterior pain, there is crepitus, tenderness maximally at the medial tibiofemoral joint line, secondarily to the inferior aspect of the medial patellar facet, no laxity with anterior/posterior, valgus/varus stressing, McMurray's benign.  Given her treatments to date, findings today both on exam and radiographically, did discuss options and she elected to proceed with Left knee intra-articular corticosteroid injection under ultrasound guidance.  Post care reviewed, she can continue her oral medications until symptoms respond to cortisone.  As she has upcoming further physical commitments, did discuss hinged knee brace for periods of increased physical activity/prolonged weightbearing.  She can otherwise follow-up as needed, if symptoms were to recur she would be a good candidate for Zilretta and/or viscosupplementation.

## 2022-03-29 ENCOUNTER — Other Ambulatory Visit: Payer: Self-pay | Admitting: Internal Medicine

## 2022-03-29 DIAGNOSIS — M5116 Intervertebral disc disorders with radiculopathy, lumbar region: Secondary | ICD-10-CM

## 2022-03-29 DIAGNOSIS — K219 Gastro-esophageal reflux disease without esophagitis: Secondary | ICD-10-CM

## 2022-03-29 DIAGNOSIS — M170 Bilateral primary osteoarthritis of knee: Secondary | ICD-10-CM

## 2022-03-29 NOTE — Telephone Encounter (Signed)
Requested Prescriptions  Pending Prescriptions Disp Refills  . celecoxib (CELEBREX) 200 MG capsule [Pharmacy Med Name: CELECOXIB 200 MG Capsule] 180 capsule 0    Sig: TAKE 1 CAPSULE TWICE DAILY     Analgesics:  COX2 Inhibitors Failed - 03/29/2022  1:34 PM      Failed - Manual Review: Labs are only required if the patient has taken medication for more than 8 weeks.      Failed - HGB in normal range and within 360 days    Hemoglobin  Date Value Ref Range Status  03/15/2022 10.4 (L) 12.0 - 15.0 g/dL Final  07/23/2021 9.7 (L) 11.1 - 15.9 g/dL Final         Failed - Cr in normal range and within 360 days    Creatinine, Ser  Date Value Ref Range Status  03/15/2022 1.14 (H) 0.44 - 1.00 mg/dL Final         Failed - HCT in normal range and within 360 days    HCT  Date Value Ref Range Status  03/15/2022 32.0 (L) 36.0 - 46.0 % Final   Hematocrit  Date Value Ref Range Status  07/23/2021 28.6 (L) 34.0 - 46.6 % Final         Passed - AST in normal range and within 360 days    AST  Date Value Ref Range Status  03/15/2022 23 15 - 41 U/L Final         Passed - ALT in normal range and within 360 days    ALT  Date Value Ref Range Status  03/15/2022 25 0 - 44 U/L Final         Passed - eGFR is 30 or above and within 360 days    GFR calc Af Amer  Date Value Ref Range Status  04/07/2020 60 >59 mL/min/1.73 Final    Comment:    **[facility] currently reports eGFR in compliance with the current**   recommendations of the [employer]. [facility] will   update reporting as new guidelines are published from the NKF-ASN   Task force.    GFR, Estimated  Date Value Ref Range Status  03/15/2022 52 (L) >60 mL/min Final    Comment:    (NOTE) Calculated using the CKD-EPI Creatinine Equation (2021)    eGFR  Date Value Ref Range Status  07/23/2021 42 (L) >59 mL/min/1.73 Final         Passed - Patient is not pregnant      Passed - Valid encounter within last 12 months     Recent Outpatient Visits          1 week ago Primary osteoarthritis of right knee   [place] [facility] at [place], Earley Abide, MD   2 weeks ago Annual physical exam   [place] [facility] at [facility], Jesse Sans, MD   2 months ago Cellulitis of toe of right foot   [place] [facility] at [facility], Jesse Sans, MD   5 months ago Acute non-recurrent maxillary sinusitis   [place] [facility] at [facility], Jesse Sans, MD   6 months ago Closed displaced fracture of fifth metatarsal bone of left foot with routine healing, subsequent encounter   [place] and Sports [facility] at [facility], Earley Abide, MD             .  traMADol (ULTRAM) 50 MG tablet [Pharmacy Med Name: TRAMADOL HYDROCHLORIDE 50 MG Tablet] 60 tablet     Sig: TAKE 1 TABLET EVERY 6 HOURS AS NEEDED     Not Delegated - Analgesics:  Opioid Agonists Failed - 03/29/2022  1:34 PM      Failed - This refill cannot be delegated      Failed - Urine Drug Screen completed in last 360 days      Passed - Valid encounter within last 3 months    Recent Outpatient Visits          1 week ago Primary osteoarthritis of right knee   [place] [facility] at [place], Earley Abide, MD   2 weeks ago Annual physical exam   [facility] at [facility], Jesse Sans, MD   2 months ago Cellulitis of toe of right foot   [place] [facility] at [facility], Jesse Sans, MD   5 months ago Acute non-recurrent maxillary sinusitis   [place] [facility] at [facility], Jesse Sans, MD   6 months ago Closed displaced fracture of fifth metatarsal bone of left foot with routine healing, subsequent encounter   [place] and  Sports Medicine at [facility], Earley Abide, MD             . famotidine (PEPCID) 20 MG tablet [Pharmacy Med Name: FAMOTIDINE 20 MG Tablet] 90 tablet     Sig: TAKE 1 [place]     Gastroenterology:  H2 Antagonists Passed - 03/29/2022  1:34 PM      Passed - Valid encounter within last 12 months    Recent Outpatient Visits          1 week ago Primary osteoarthritis of right knee   [place] [facility] at [place], Earley Abide, MD   2 weeks ago Annual physical exam   [place] and Sports Medicine at [facility], Jesse Sans, MD   2 months ago Cellulitis of toe of right foot   [place] at [facility], Jesse Sans, MD   5 months ago Acute non-recurrent maxillary sinusitis    Primary Care and Sports Medicine at [facility], Jesse Sans, MD   6 months ago Closed displaced fracture of fifth metatarsal bone of left foot with routine healing, subsequent encounter   [place] and Sports Medicine at [facility], Earley Abide, MD             . gabapentin (NEURONTIN) 100 MG capsule [Pharmacy Med Name: GABAPENTIN 100 MG Capsule] 360 capsule 1    Sig: TAKE 2 [place]     Neurology: Anticonvulsants - gabapentin Failed - 03/29/2022  1:34 PM      Failed - Cr in normal range and within 360 days    Creatinine, Ser  Date Value Ref Range Status  03/15/2022 1.14 (H) 0.44 - 1.00 mg/dL Final         Passed - Completed PHQ-2 or PHQ-9 in the last 360 days      Passed - Valid encounter within last 12 months    Recent Outpatient Visits          1 week ago Primary osteoarthritis of right knee    Primary Care and  Sports Medicine at [employer], Earley Abide, MD   2 weeks ago Annual physical exam   [facility] at [facility], Jesse Sans, MD   2 months ago  Cellulitis of toe of right foot   [place] [facility] and [facility] at [facility], Jesse Sans, MD   5 months ago Acute non-recurrent maxillary sinusitis   [place] [facility] and [facility] Medicine at [facility], Jesse Sans, MD   6 months ago Closed displaced fracture of fifth metatarsal bone of left foot with routine healing, subsequent encounter   [place] [facility] and [facility] Medicine at [facility], Earley Abide, MD             . glucose blood (TRUE METRIX BLOOD GLUCOSE TEST) test strip [Pharmacy Med Name: TRUE METRIX SELF MONITORING BLOOD GLUCOSE STRIPS   Strip] 100 strip 3    Sig: [place]     Endocrinology: Diabetes - Testing Supplies Passed - 03/29/2022  1:34 PM      Passed - Valid encounter within last 12 months    Recent Outpatient Visits          1 week ago Primary osteoarthritis of right knee   [place] [facility] at [place], Earley Abide, MD   2 weeks ago Annual physical exam   [facility] at [facility], Jesse Sans, MD   2 months ago Cellulitis of toe of right foot   [place] [facility] Care and [facility] Medicine at [facility], Jesse Sans, MD   5 months ago Acute non-recurrent maxillary sinusitis   [place] Primary Care and Sports Medicine at [facility], Jesse Sans, MD   6 months ago Closed displaced fracture of fifth metatarsal bone of left foot with routine healing, subsequent encounter   [place] and Sports Medicine at [facility], Earley Abide, MD

## 2022-03-29 NOTE — Telephone Encounter (Signed)
Requested medication (s) are due for refill today: yes  Requested medication (s) are on the active medication list: yes  Last refill:  03/15/22  Future visit scheduled: no  Notes to clinic:  medication delegated to NT to reorder   Requested Prescriptions  Pending Prescriptions Disp Refills   traMADol (ULTRAM) 50 MG tablet [Pharmacy Med Name: TRAMADOL HYDROCHLORIDE 50 MG Tablet] 60 tablet     Sig: TAKE 1 TABLET EVERY 6 HOURS AS NEEDED     Not Delegated - Analgesics:  Opioid Agonists Failed - 03/29/2022  1:34 PM      Failed - This refill cannot be delegated      Failed - Urine Drug Screen completed in last 360 days      Passed - Valid encounter within last 3 months    Recent Outpatient Visits           1 week ago Primary osteoarthritis of right knee   [place] [facility] and [facility] Medicine at [place], Earley Abide, MD   2 weeks ago Annual physical exam   [place] and Sports Medicine at [facility], Jesse Sans, MD   2 months ago Cellulitis of toe of right foot   [place] [facility] and [facility] Medicine at [facility], Jesse Sans, MD   5 months ago Acute non-recurrent maxillary sinusitis   [place] [facility] at [facility], Jesse Sans, MD   6 months ago Closed displaced fracture of fifth metatarsal bone of left foot with routine healing, subsequent encounter   [place] and Sports Medicine at [facility], Earley Abide, MD              Signed Prescriptions Disp Refills   celecoxib (CELEBREX) 200 MG capsule 180 capsule 0    Sig: TAKE 1 CAPSULE TWICE DAILY     Analgesics:  COX2 Inhibitors Failed - 03/29/2022  1:34 PM      Failed - Manual Review: Labs are only required if the patient has taken medication for more than 8 weeks.      Failed - HGB in normal range and within 360 days    Hemoglobin  Date Value Ref Range Status  03/15/2022 10.4 (L) 12.0 - 15.0  g/dL Final  07/23/2021 9.7 (L) 11.1 - 15.9 g/dL Final         Failed - Cr in normal range and within 360 days    Creatinine, Ser  Date Value Ref Range Status  03/15/2022 1.14 (H) 0.44 - 1.00 mg/dL Final         Failed - HCT in normal range and within 360 days    HCT  Date Value Ref Range Status  03/15/2022 32.0 (L) 36.0 - 46.0 % Final   Hematocrit  Date Value Ref Range Status  07/23/2021 28.6 (L) 34.0 - 46.6 % Final         Passed - AST in normal range and within 360 days    AST  Date Value Ref Range Status  03/15/2022 23 15 - 41 U/L Final         Passed - ALT in normal range and within 360 days    ALT  Date Value Ref Range Status  03/15/2022 25 0 - 44 U/L Final         Passed - eGFR is 30 or above and within 360 days    GFR calc Af Wyvonnia Lora  Date  Value Ref Range Status  04/07/2020 60 >59 mL/min/1.73 Final    Comment:    **[facility] currently reports eGFR in compliance with the current**   recommendations of the [employer]. [facility] will   update reporting as new guidelines are published from the NKF-ASN   Task force.    GFR, Estimated  Date Value Ref Range Status  03/15/2022 52 (L) >60 mL/min Final    Comment:    (NOTE) Calculated using the CKD-EPI Creatinine Equation (2021)    eGFR  Date Value Ref Range Status  07/23/2021 42 (L) >59 mL/min/1.73 Final         Passed - Patient is not pregnant      Passed - Valid encounter within last 12 months    Recent Outpatient Visits           1 week ago Primary osteoarthritis of right knee   [place] [facility] at [place], Earley Abide, MD   2 weeks ago Annual physical exam   [facility] at [facility], Jesse Sans, MD   2 months ago Cellulitis of toe of right foot   [place] [facility] Care and [facility] Medicine at [facility], Jesse Sans, MD   5 months ago Acute non-recurrent maxillary sinusitis   [place]  Primary Care and Sports Medicine at [facility], Jesse Sans, MD   6 months ago Closed displaced fracture of fifth metatarsal bone of left foot with routine healing, subsequent encounter   [place] [facility] at [facility], Earley Abide, MD               gabapentin (NEURONTIN) 100 MG capsule 360 capsule 1    Sig: TAKE 2 CAPSULES TWICE DAILY     Neurology: Anticonvulsants - gabapentin Failed - 03/29/2022  1:34 PM      Failed - Cr in normal range and within 360 days    Creatinine, Ser  Date Value Ref Range Status  03/15/2022 1.14 (H) 0.44 - 1.00 mg/dL Final         Passed - Completed PHQ-2 or PHQ-9 in the last 360 days      Passed - Valid encounter within last 12 months    Recent Outpatient Visits           1 week ago Primary osteoarthritis of right knee   [place] [facility] [place], Earley Abide, MD   2 weeks ago Annual physical exam   [place] at [facility], Jesse Sans, MD   2 months ago Cellulitis of toe of right foot   [place] at [facility], Jesse Sans, MD   5 months ago Acute non-recurrent maxillary sinusitis    Primary Care and Sports Medicine at [facility], Jesse Sans, MD   6 months ago Closed displaced fracture of fifth metatarsal bone of left foot with routine healing, subsequent encounter   [place] and Sports Medicine at [facility], Earley Abide, MD               glucose blood (TRUE METRIX BLOOD GLUCOSE TEST) test strip 100 strip 3    Sig: [place]     Endocrinology: Diabetes - Testing Supplies Passed - 03/29/2022  1:34 PM  Passed - Valid encounter within last 12 months    Recent Outpatient Visits           1 week ago Primary osteoarthritis of right knee   [place] [facility] and [facility] Medicine at  [place], Earley Abide, MD   2 weeks ago Annual physical exam   [facility] at [facility], Jesse Sans, MD   2 months ago Cellulitis of toe of right foot   [place] [facility] Care and Sports Medicine at [facility], Jesse Sans, MD   5 months ago Acute non-recurrent maxillary sinusitis   [place] [facility] and [facility] Medicine at [facility], Jesse Sans, MD   6 months ago Closed displaced fracture of fifth metatarsal bone of left foot with routine healing, subsequent encounter   [place] [facility] Medicine at [facility], Earley Abide, MD              Refused Prescriptions Disp Refills   famotidine (PEPCID) 20 MG tablet [Pharmacy Med Name: FAMOTIDINE 20 MG Tablet] 90 tablet     Sig: TAKE 1 [place]     Gastroenterology:  H2 Antagonists Passed - 03/29/2022  1:34 PM      Passed - Valid encounter within last 12 months    Recent Outpatient Visits           1 week ago Primary osteoarthritis of right knee   [place] [facility] and [facility] Medicine at [place], Earley Abide, MD   2 weeks ago Annual physical exam   [place] at [facility], Jesse Sans, MD   2 months ago Cellulitis of toe of right foot   [place] at [facility], Jesse Sans, MD   5 months ago Acute non-recurrent maxillary sinusitis   [place] [facility] Care and Sports Medicine at [facility], Jesse Sans, MD   6 months ago Closed displaced fracture of fifth metatarsal bone of left foot with routine healing, subsequent encounter   [place] and Sports Medicine at [facility], Earley Abide, MD

## 2022-04-11 ENCOUNTER — Ambulatory Visit (INDEPENDENT_AMBULATORY_CARE_PROVIDER_SITE_OTHER): Payer: Medicare HMO | Admitting: Internal Medicine

## 2022-04-11 ENCOUNTER — Encounter: Payer: Self-pay | Admitting: Internal Medicine

## 2022-04-11 VITALS — BP 128/74 | HR 62 | Ht 66.0 in | Wt 179.0 lb

## 2022-04-11 DIAGNOSIS — I10 Essential (primary) hypertension: Secondary | ICD-10-CM | POA: Diagnosis not present

## 2022-04-11 DIAGNOSIS — R252 Cramp and spasm: Secondary | ICD-10-CM | POA: Diagnosis not present

## 2022-04-11 MED ORDER — LOSARTAN POTASSIUM 100 MG PO TABS: 100.0000 mg | ORAL_TABLET | Freq: Every day | ORAL | 1 refills | Status: DC

## 2022-04-11 NOTE — Progress Notes (Signed)
Date:  04/11/2022   Name:  Kiara Campbell   DOB:  [birth year]   MRN:  [id number]   Chief Complaint: Leg Pain  Leg Pain  Incident onset: 12 days. There was no injury mechanism. The pain is present in the left leg, right leg, right foot, left foot, right thigh, left hip, right toes, left ankle, right ankle and left thigh. The quality of the pain is described as cramping. The pain is at a severity of 8/10 (8-9 at night). The pain is mild. The pain has been Constant (every night) since onset. She reports no foreign bodies present. Exacerbated by: laying in bed at night. She has tried heat, ice, NSAIDs and acetaminophen for the symptoms. The treatment provided no relief.    Lab Results  Component Value Date   NA 140 03/15/2022   K 4.2 03/15/2022   CO2 26 03/15/2022   GLUCOSE 117 (H) 03/15/2022   BUN 32 (H) 03/15/2022   CREATININE 1.14 (H) 03/15/2022   CALCIUM 9.8 03/15/2022   EGFR 42 (L) 07/23/2021   GFRNONAA 52 (L) 03/15/2022   Lab Results  Component Value Date   CHOL 109 03/15/2022   HDL 42 03/15/2022   LDLCALC 55 03/15/2022   TRIG 62 03/15/2022   CHOLHDL 2.6 03/15/2022   Lab Results  Component Value Date   TSH 1.797 03/15/2022   Lab Results  Component Value Date   HGBA1C 6.5 (H) 03/15/2022   Lab Results  Component Value Date   WBC 8.3 03/15/2022   HGB 10.4 (L) 03/15/2022   HCT 32.0 (L) 03/15/2022   MCV 97.9 03/15/2022   PLT 288 03/15/2022   Lab Results  Component Value Date   ALT 25 03/15/2022   AST 23 03/15/2022   ALKPHOS 79 03/15/2022   BILITOT 0.7 03/15/2022   No results found for: "25OHVITD2", "25OHVITD3", "VD25OH"   Review of Systems  Constitutional:  Positive for fatigue (from lack of sleep). Negative for chills, fever and unexpected weight change.  Respiratory:  Negative for chest tightness and shortness of breath.   Cardiovascular:  Negative for chest pain, palpitations and leg swelling.  Musculoskeletal:  Positive for myalgias.   Psychiatric/Behavioral:  Positive for sleep disturbance. Negative for dysphoric mood. The patient is not nervous/anxious.     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   Tobacco use disorder, severe, in early remission 08/09/2017   Obesity (BMI 30-39.9) 07/17/2017   GERD (gastroesophageal reflux disease) 07/12/2017   Type II diabetes mellitus with complication ([place]) 32/35/5732   Fibrocystic breast disease 12/03/2014   Colon polyp 12/03/2014   Diverticulitis of colon 12/03/2014   Essential (primary) hypertension 12/03/2014   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   OP (osteoporosis) 12/03/2014   Hyperlipidemia associated with type 2 diabetes mellitus ([place]) 12/03/2014    Allergies  Allergen Reactions   Glipizide Nausea Only    Past Surgical History:  Procedure Laterality Date   ABDOMINAL HYSTERECTOMY     partial   ABDOMINAL HYSTERECTOMY     APPENDECTOMY     BREAST CYST ASPIRATION  neg not sure which side   COLONOSCOPY WITH PROPOFOL N/A 02/03/2021   Procedure: COLONOSCOPY WITH PROPOFOL;  Surgeon: Virgel Manifold, MD;  Location: [facility] ENDOSCOPY;  Service: Endoscopy;  Laterality: N/A;   ESOPHAGOGASTRODUODENOSCOPY N/A 02/03/2021   Procedure: ESOPHAGOGASTRODUODENOSCOPY (EGD);  Surgeon: Virgel Manifold, MD;  Location: [facility] END[facility]COPY;  Service: Endoscopy;  Laterality: N/A;   JOINT REPLACEMENT     LESION DESTRUCTION N/A 08/18/2021   Procedure: BIOPSY OF ANORECTAL ULCER;  Surgeon: Ileana Roup, MD;  Location: [place];  Service: General;  Laterality: N/A;   RECTAL  PROLAPSE REPAIR     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   TONSILLECTOMY     VARICOSE VEIN SURGERY      Social History   Tobacco Use   Smoking status: Former    Packs/day: 0.50    Years: 30.00    Total pack years: 15.00    Types: Cigarettes    Quit date: 08/04/2018    Years since quitting: 3.6   Smokeless tobacco: Never   Tobacco comments:    Patient declines Lung Cancer Screening.  Vaping Use   Vaping Use: Never used  Substance Use Topics   Alcohol use: Yes    Comment: rarely   Drug use: Never     Medication list has been reviewed and updated.  Current Meds  Medication Sig   Accu-Chek Softclix Lancets lancets    Alcohol Swabs (B-D SINGLE USE SWABS REGULAR) PADS USE DAILY.   allopurinol (ZYLOPRIM) 300 MG tablet TAKE 1 TABLET EVERY DAY   azelastine (ASTELIN) 0.1 % nasal spray Place 1 spray into both nostrils 2 (two) times daily. Use in each nostril as directed   Calcium Carb-Ergocalciferol 500-200 MG-UNIT TABS Take 1 tablet by mouth daily.   celecoxib (CELEBREX) 200 MG capsule TAKE 1 CAPSULE TWICE DAILY   colchicine (COLCRYS) 0.6 MG tablet Take 1 tablet (0.6 mg total) by mouth 2 (two) times daily.   fexofenadine (ALLEGRA) 60 MG tablet Take 60 mg by mouth 2 (two) times daily.   FIBER PO Take 1 capsule by mouth in the morning and at bedtime.   fluticasone (FLONASE) 50 MCG/ACT nasal spray Place 1 spray into both nostrils daily.   gabapentin (NEURONTIN) 100 MG capsule TAKE 2 CAPSULES TWICE DAILY   glucose blood (TRUE METRIX BLOOD GLUCOSE TEST) test strip TEST BLOOD SUGAR EVERY DAY   Lancet Devices (ACCU-CHEK SOFTCLIX) lancets 1 each by Other route daily. Use as instructed   Lancets Misc. (ACCU-CHEK SOFTCLIX LANCET DEV) KIT 1 each by Does not apply route daily.   losartan-hydrochlorothiazide (HYZAAR) 100-25 MG tablet TAKE 1 TABLET EVERY DAY   metFORMIN (GLUCOPHAGE-XR) 500 MG 24 hr tablet TAKE 1 TABLET EVERY DAY WITH BREAKFAST   metoprolol succinate (TOPROL-XL) 50 MG 24 hr  tablet TAKE 3 TABLETS DAILY WITH OR IMMEDIATELY FOLLOWING A MEAL   mupirocin ointment (BACTROBAN) 2 % PLACE 1 APPLICATION INTO THE NOSE 2 TIMES A DAILY.   omeprazole (PRILOSEC) 20 MG capsule TAKE 1 CAPSULE EVERY DAY   simvastatin (ZOCOR) 40 MG tablet TAKE 1 TABLET AT BEDTIME   traMADol (ULTRAM) 50 MG tablet Take 1 tablet (50 mg total) by mouth every 6 (six) hours as needed.   vitamin C (ASCORBIC ACID) 500 MG tablet Take 500 mg by mouth daily.   Vitamin D, Ergocalciferol, (DRISDOL) 1.25 MG (50000 UNIT) CAPS capsule Take 1 capsule (50,000 Units total) by mouth every 7 (seven) days. Take for 8 total doses(weeks)  03/15/2022   10:47 AM 01/19/2022    3:04 PM 10/13/2021    2:58 PM 10/01/2021   11:09 AM  GAD 7 : Generalized Anxiety Score  Nervous, Anxious, on Edge 1 0 2 2  Control/stop worrying 0 0 2 1  Worry too much - different things 0 0 1 1  Trouble relaxing 2 0 0 1  Restless 2 0 0 0  Easily annoyed or irritable 2 0 1 1  Afraid - awful might happen 0 0 0 0  Total GAD 7 Score 7 0 6 6  Anxiety Difficulty Somewhat difficult Not difficult at all Somewhat difficult        04/11/2022    3:40 PM 03/15/2022   10:47 AM 01/19/2022    3:04 PM  Depression screen PHQ 2/9  Decreased Interest _0 Down, Depressed, Hopeless _1 PHQ - 2 Score _2 Altered sleeping _3 Tired, decreased energy _4 Change in appetite 0 1 0  Feeling bad or failure about yourself  0 1 0  Trouble concentrating 1 0 0  Moving slowly or fidgety/restless 0 1 0  Suicidal thoughts 0 0 0  PHQ-9 Score _5 Difficult doing work/chores Not difficult at all Not difficult at all Not difficult at all    BP Readings from Last 3 Encounters:  04/11/22 136/74  03/22/22 130/88  03/15/22 134/70    Physical Exam Vitals and nursing note reviewed.  Constitutional:      General: She is not in acute distress.    Appearance: Normal appearance. She is well-developed.  HENT:     Head: Normocephalic and  atraumatic.  Cardiovascular:     Rate and Rhythm: Normal rate and regular rhythm.  Pulmonary:     Effort: Pulmonary effort is normal. No respiratory distress.     Breath sounds: No wheezing or rhonchi.  Musculoskeletal:        General: No swelling, tenderness or deformity.     Right lower leg: No edema.     Left lower leg: No edema.  Skin:    General: Skin is warm and dry.     Capillary Refill: Capillary refill takes less than 2 seconds.     Findings: No rash.  Neurological:     General: No focal deficit present.     Mental Status: She is alert and oriented to person, place, and time.  Psychiatric:        Mood and Affect: Mood normal.        Behavior: Behavior normal.     Wt Readings from Last 3 Encounters:  04/11/22 179 lb (81.2 kg)  03/22/22 180 lb (81.6 kg)  03/15/22 180 lb 9.6 oz (81.9 kg)    BP 136/74 (BP Location: Left Arm)   Pulse 62   Ht _6  (1.676 m)   Wt 179 lb (81.2 kg)   LMP 09/27/1995   SpO2 95%   BMI 28.89 kg/m   Assessment and Plan: 1. Muscle cramps at night Will check labs including K+ and Mg++ Recommend beginning magnesium supplement 400 mg daily Discontinue HCTZ in HTN med - Basic metabolic panel - Magnesium  2. Essential (primary) hypertension Stop losartan hct; start losartan 100 mg alone Monitor BP at home for change - losartan (COZAAR) 100 MG tablet; Take 1 tablet (100 mg total) by mouth daily.  Dispense: 90 tablet; Refill: 1   Partially dictated using Editor, commissioning. Any errors  are unintentional.  Halina Maidens, MD [facility]  04/11/2022

## 2022-04-11 NOTE — Patient Instructions (Signed)
Magnesium Oxide 400 mg take one a day  Stop the Losartan HCT when the new Losartan arrives.

## 2022-04-12 LAB — BASIC METABOLIC PANEL
BUN/Creatinine Ratio: 27 (ref 12–28)
BUN: 30 mg/dL — ABNORMAL HIGH (ref 8–27)
CO2: 23 mmol/L (ref 20–29)
Calcium: 10.3 mg/dL (ref 8.7–10.3)
Chloride: 96 mmol/L (ref 96–106)
Creatinine, Ser: 1.11 mg/dL — ABNORMAL HIGH (ref 0.57–1.00)
Glucose: 104 mg/dL — ABNORMAL HIGH (ref 70–99)
Potassium: 4.7 mmol/L (ref 3.5–5.2)
Sodium: 142 mmol/L (ref 134–144)
eGFR: 54 mL/min/{1.73_m2} — ABNORMAL LOW (ref >59)

## 2022-04-12 LAB — MAGNESIUM: Magnesium: 2.2 mg/dL (ref 1.6–2.3)

## 2022-04-28 ENCOUNTER — Other Ambulatory Visit: Payer: Self-pay | Admitting: Internal Medicine

## 2022-04-28 DIAGNOSIS — M5116 Intervertebral disc disorders with radiculopathy, lumbar region: Secondary | ICD-10-CM

## 2022-04-28 NOTE — Telephone Encounter (Signed)
Requested medication (s) are due for refill today - yes  Requested medication (s) are on the active medication list -yes  Future visit scheduled -no  Last refill: 03/15/22 #60  Notes to clinic: non delegated Rx  Requested Prescriptions  Pending Prescriptions Disp Refills   traMADol (ULTRAM) 50 MG tablet [Pharmacy Med Name: traMADol HCl 50 MG Oral Tablet] 60 tablet 0    Sig: TAKE 1 TABLET BY MOUTH EVERY 6 HOURS AS NEEDED     Not Delegated - Analgesics:  Opioid Agonists Failed - 04/28/2022  9:43 AM      Failed - This refill cannot be delegated      Failed - Urine Drug Screen completed in last 360 days      Passed - Valid encounter within last 3 months    Recent Outpatient Visits           2 weeks ago Muscle cramps at night   [place] at [facility], Jesse Sans, MD   1 month ago Primary osteoarthritis of left knee   [place] [facility] at [place], Earley Abide, MD   1 month ago Annual physical exam   [place] and [facility] at [facility], Jesse Sans, MD   3 months ago Cellulitis of toe of right foot   [place] at [facility], Jesse Sans, MD   6 months ago Acute non-recurrent maxillary sinusitis   [place] [facility] at [facility], Jesse Sans, MD                 Requested Prescriptions  Pending Prescriptions Disp Refills   traMADol (ULTRAM) 50 MG tablet [Pharmacy Med Name: traMADol HCl 50 MG Oral Tablet] 60 tablet 0    Sig: TAKE 1 TABLET BY MOUTH EVERY 6 HOURS AS NEEDED     Not Delegated - Analgesics:  Opioid Agonists Failed - 04/28/2022  9:43 AM      Failed - This refill cannot be delegated      Failed - Urine Drug Screen completed in last 360 days      Passed - Valid encounter within last 3 months    Recent Outpatient Visits           2 weeks ago Muscle cramps  at night   [place] at [facility], Jesse Sans, MD   1 month ago Primary osteoarthritis of left knee   [place] and Sports Medicine at [place], Earley Abide, MD   1 month ago Annual physical exam   [place] and [facility] at [facility], Jesse Sans, MD   3 months ago Cellulitis of toe of right foot   [place] at [facility], Jesse Sans, MD   6 months ago Acute non-recurrent maxillary sinusitis   [place] [facility] at [facility], Jesse Sans, MD

## 2022-04-29 NOTE — Telephone Encounter (Signed)
Please review.  KP

## 2022-05-17 ENCOUNTER — Other Ambulatory Visit: Payer: Self-pay | Admitting: Internal Medicine

## 2022-05-17 DIAGNOSIS — E118 Type 2 diabetes mellitus with unspecified complications: Secondary | ICD-10-CM

## 2022-05-17 DIAGNOSIS — I10 Essential (primary) hypertension: Secondary | ICD-10-CM

## 2022-05-18 NOTE — Telephone Encounter (Signed)
Requested Prescriptions  Pending Prescriptions Disp Refills   metFORMIN (GLUCOPHAGE-XR) 500 MG 24 hr tablet [Pharmacy Med Name: METFORMIN HYDROCHLORIDE ER 500 MG Tablet Extended Release 24 Hour] 90 tablet 0    Sig: TAKE 1 TABLET EVERY DAY WITH BREAKFAST     Endocrinology:  Diabetes - Biguanides Failed - 05/17/2022  7:25 PM      Failed - Cr in normal range and within 360 days    Creatinine, Ser  Date Value Ref Range Status  04/11/2022 1.11 (H) 0.57 - 1.00 mg/dL Final         Failed - eGFR in normal range and within 360 days    GFR calc Af Amer  Date Value Ref Range Status  04/07/2020 60 >59 mL/min/1.73 Final    Comment:    **[facility] currently reports eGFR in compliance with the current**   recommendations of the [employer]. [facility] will   update reporting as new guidelines are published from the NKF-ASN   Task force.    GFR, Estimated  Date Value Ref Range Status  03/15/2022 52 (L) >60 mL/min Final    Comment:    (NOTE) Calculated using the CKD-EPI Creatinine Equation (2021)    eGFR  Date Value Ref Range Status  04/11/2022 54 (L) >59 mL/min/1.73 Final         Failed - B12 Level in normal range and within 720 days    Vitamin B-12  Date Value Ref Range Status  07/23/2021 1,432 (H) 232 - 1,245 pg/mL Final         Passed - HBA1C is between 0 and 7.9 and within 180 days    Hgb A1c MFr Bld  Date Value Ref Range Status  03/15/2022 6.5 (H) 4.8 - 5.6 % Final    Comment:    (NOTE) Pre diabetes:          5.7%-6.4%  Diabetes:              >6.4%  Glycemic control for   <7.0% adults with diabetes          Passed - Valid encounter within last 6 months    Recent Outpatient Visits           1 month ago Muscle cramps at night   [facility] at [facility], Jesse Sans, MD   1 month ago Primary osteoarthritis of left knee   [place] [facility] at [place], Earley Abide, MD   2  months ago Annual physical exam   [place] and Sports Medicine at [facility], Jesse Sans, MD   3 months ago Cellulitis of toe of right foot   [place] at [facility], Jesse Sans, MD   7 months ago Acute non-recurrent maxillary sinusitis    Primary [facility] and [facility] Medicine at [facility], Jesse Sans, MD              Passed - CBC within normal limits and completed in the last 12 months    WBC  Date Value Ref Range Status  03/15/2022 8.3 4.0 - 10.5 K/uL Final   RBC  Date Value Ref Range Status  03/15/2022 3.27 (L) 3.87 - 5.11 MIL/uL Final   Hemoglobin  Date Value Ref Range Status  03/15/2022 10.4 (L) 12.0 - 15.0 g/dL Final  07/23/2021 9.7 (L) 11.1 - 15.9 g/dL Final   HCT  Date Value Ref Range Status  03/15/2022 32.0 (L) 36.0 - 46.0 % Final   Hematocrit  Date Value Ref Range Status  07/23/2021 28.6 (L) 34.0 - 46.6 % Final   MCHC  Date Value Ref Range Status  03/15/2022 32.5 30.0 - 36.0 g/dL Final   [facility]  Date Value Ref Range Status  03/15/2022 31.8 26.0 - 34.0 pg Final   MCV  Date Value Ref Range Status  03/15/2022 97.9 80.0 - 100.0 fL Final  07/23/2021 96 79 - 97 fL Final   No results found for: "PLTCOUNTKUC", "LABPLAT", "POCPLA" RDW  Date Value Ref Range Status  03/15/2022 20.6 (H) 11.5 - 15.5 % Final  07/23/2021 20.2 (H) 11.7 - 15.4 % Final          metoprolol succinate (TOPROL-XL) 50 MG 24 hr tablet [Pharmacy Med Name: METOPROLOL SUCCINATE ER 50 MG Tablet Extended Release 24 Hour] 270 tablet 0    Sig: TAKE 3 TABLETS DAILY WITH OR IMMEDIATELY FOLLOWING A MEAL     Cardiovascular:  Beta Blockers Passed - 05/17/2022  7:25 PM      Passed - Last BP in normal range    BP Readings from Last 1 Encounters:  04/11/22 128/74         Passed - Last Heart Rate in normal range    Pulse Readings from Last 1 Encounters:  04/11/22 62         Passed - Valid encounter  within last 6 months    Recent Outpatient Visits           1 month ago Muscle cramps at night   [facility] at [facility], Jesse Sans, MD   1 month ago Primary osteoarthritis of left knee   [place] [facility] at [place], Earley Abide, MD   2 months ago Annual physical exam   [place] and Sports Medicine at [facility], Jesse Sans, MD   3 months ago Cellulitis of toe of right foot   [place] at [facility], Jesse Sans, MD   7 months ago Acute non-recurrent maxillary sinusitis   [place] [facility] at [facility] Jesse Sans, MD

## 2022-05-31 ENCOUNTER — Other Ambulatory Visit: Payer: Self-pay | Admitting: Internal Medicine

## 2022-05-31 DIAGNOSIS — M5116 Intervertebral disc disorders with radiculopathy, lumbar region: Secondary | ICD-10-CM

## 2022-05-31 MED ORDER — TRAMADOL HCL 50 MG PO TABS: 50.0000 mg | ORAL_TABLET | Freq: Four times a day (QID) | ORAL | 0 refills | Status: DC | PRN

## 2022-05-31 NOTE — Telephone Encounter (Signed)
Please review. Last office visit 04/11/2022.  KP

## 2022-06-29 ENCOUNTER — Other Ambulatory Visit: Payer: Self-pay

## 2022-06-29 ENCOUNTER — Other Ambulatory Visit: Payer: Self-pay | Admitting: Internal Medicine

## 2022-06-29 DIAGNOSIS — K219 Gastro-esophageal reflux disease without esophagitis: Secondary | ICD-10-CM

## 2022-06-29 DIAGNOSIS — M170 Bilateral primary osteoarthritis of knee: Secondary | ICD-10-CM

## 2022-06-29 DIAGNOSIS — E118 Type 2 diabetes mellitus with unspecified complications: Secondary | ICD-10-CM

## 2022-06-29 DIAGNOSIS — I10 Essential (primary) hypertension: Secondary | ICD-10-CM

## 2022-06-29 MED ORDER — CELECOXIB 200 MG PO CAPS: 200.0000 mg | ORAL_CAPSULE | Freq: Two times a day (BID) | ORAL | 0 refills | Status: DC

## 2022-06-29 MED ORDER — OMEPRAZOLE 20 MG PO CPDR: 20.0000 mg | DELAYED_RELEASE_CAPSULE | Freq: Every day | ORAL | 0 refills | Status: DC

## 2022-07-08 ENCOUNTER — Other Ambulatory Visit: Payer: Self-pay | Admitting: Internal Medicine

## 2022-07-08 DIAGNOSIS — M5116 Intervertebral disc disorders with radiculopathy, lumbar region: Secondary | ICD-10-CM

## 2022-07-08 NOTE — Telephone Encounter (Signed)
Requested medication (s) are due for refill today:Yes  Requested medication (s) are on the active medication list: Yes  Last refill:  05/31/22  Future visit scheduled:No  Notes to clinic:  Unable to refill per protocol, cannot delegate.      Requested Prescriptions  Pending Prescriptions Disp Refills   traMADol (ULTRAM) 50 MG tablet [Pharmacy Med Name: traMADol HCl 50 MG Oral Tablet] 60 tablet 0    Sig: TAKE 1 TABLET BY MOUTH EVERY 6 HOURS AS NEEDED     Not Delegated - Analgesics:  Opioid Agonists Failed - 07/08/2022  4:01 PM      Failed - This refill cannot be delegated      Failed - Urine Drug Screen completed in last 360 days      Passed - Valid encounter within last 3 months    Recent Outpatient Visits           2 months ago Muscle cramps at night   [facility] at [facility], Jesse Sans, MD   3 months ago Primary osteoarthritis of left knee   [place] [facility] at [place], Earley Abide, MD   3 months ago Annual physical exam   [facility] and [facility] Medicine at [facility], Jesse Sans, MD   5 months ago Cellulitis of toe of right foot   [place] [facility] at [facility], Jesse Sans, MD   9 months ago Acute non-recurrent maxillary sinusitis   [place] [facility] at [facility], Jesse Sans, MD

## 2022-07-08 NOTE — Telephone Encounter (Signed)
Please review. Last office visit 04/11/2022.  KP

## 2022-07-11 NOTE — Telephone Encounter (Signed)
Please call pt to schedule an appointment.  KP

## 2022-07-25 ENCOUNTER — Ambulatory Visit (INDEPENDENT_AMBULATORY_CARE_PROVIDER_SITE_OTHER): Payer: Medicare HMO

## 2022-07-25 ENCOUNTER — Ambulatory Visit
Admission: EM | Admit: 2022-07-25 | Discharge: 2022-07-25 | Disposition: A | Discharge status: Home / Self Care | Payer: Medicare HMO | Attending: Emergency Medicine | Admitting: Emergency Medicine

## 2022-07-25 DIAGNOSIS — S2243XA Multiple fractures of ribs, bilateral, initial encounter for closed fracture: Secondary | ICD-10-CM | POA: Diagnosis not present

## 2022-07-25 DIAGNOSIS — M25511 Pain in right shoulder: Secondary | ICD-10-CM | POA: Diagnosis not present

## 2022-07-25 DIAGNOSIS — R0602 Shortness of breath: Secondary | ICD-10-CM | POA: Diagnosis not present

## 2022-07-25 DIAGNOSIS — R0789 Other chest pain: Secondary | ICD-10-CM

## 2022-07-25 DIAGNOSIS — S40011A Contusion of right shoulder, initial encounter: Secondary | ICD-10-CM

## 2022-07-25 DIAGNOSIS — S2241XA Multiple fractures of ribs, right side, initial encounter for closed fracture: Secondary | ICD-10-CM | POA: Diagnosis not present

## 2022-07-25 MED ORDER — HYDROCODONE-ACETAMINOPHEN 5-325 MG PO TABS: 1.0000 | ORAL_TABLET | Freq: Four times a day (QID) | ORAL | 0 refills | Status: DC | PRN

## 2022-07-25 NOTE — Discharge Instructions (Addendum)
Your x-rays show that you have fractured your sixth and seventh rib on the right side.  Your shoulder x-rays were normal.  You may continue to use your tramadol twice daily as needed for pain in your ribs.  I have written you for some Norco that you can use for more severe pain or at bedtime as needed for pain in your ribs.  Do not take the Norco and your tramadol together.  Also do not drink alcohol or drive while taking the Norco.  You need to take 10 deep breaths every hour to keep your lungs inflated and prevent pneumonia from occurring.  You can purchase a rib belt from [employer] that you can wear to help give yourself some support.  I would recommend bracing your ribs with a pillow or your arm whenever you cough, sneeze, or take a deep breath.  If you develop any increasing pain, shortness of breath, fevers, or start coughing up sputum either return for reevaluation, see your primary care provider, or seek care in the ER.

## 2022-07-25 NOTE — ED Triage Notes (Signed)
Patient presents to UC for fall 4 days ago. C/o of right sided rib pain, SOB,  and right shoulder pain. Taking tramadol.

## 2022-07-25 NOTE — ED Provider Notes (Signed)
[facility]    CSN: [id number] Arrival date & time: 07/25/22  1449      History   Chief Complaint Chief Complaint  Patient presents with   Shoulder Pain   Rib Injury    HPI Kiara Campbell is a 67 y.o. female.   HPI  67 year old female here for evaluation of musculoskeletal complaints.  The patient reports that 4 days ago she was walking into [employer] when she tripped over the curb and suffered a ground-level fall.  In the fall she landed on her right palm and right knee but also struck her right shoulder and right chest wall on the pavement.  She is complaining of pain in the side of her right chest wall as well as in her right shoulder.  She has been taking tramadol twice daily, which she has been prescribed for her back, without any improvement of symptoms.  She denies any numbness, tingling, or weakness in her right hand.  She has been coughing but she denies any hemoptysis.  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   Tobacco use disorder, severe, in early remission 08/09/2017   Obesity (BMI 30-39.9) 07/17/2017   GERD (gastroesophageal reflux disease) 07/12/2017   Type II diabetes mellitus with complication ([place]) 42/59/5638   Fibrocystic breast disease 12/03/2014   Colon polyp 12/03/2014   Diverticulitis of colon 12/03/2014   Essential (primary) hypertension 12/03/2014   Lumbar disc herniation with  radiculopathy 12/03/2014   OP (osteoporosis) 12/03/2014   Hyperlipidemia associated with type 2 diabetes mellitus ([place]) 12/03/2014    Past Surgical History:  Procedure Laterality Date   ABDOMINAL HYSTERECTOMY     partial   ABDOMINAL HYSTERECTOMY     APPENDECTOMY     BREAST CYST ASPIRATION     neg not sure which side   COLONOSCOPY WITH PROPOFOL N/A 02/03/2021   Procedure: COLONOSCOPY WITH PROPOFOL;  Surgeon: Virgel Manifold, MD;  Location: [facility] ENDOSCOPY;  Service: Endoscopy;  Laterality: N/A;   ESOPHAGOGASTRODUODENOSCOPY N/A 02/03/2021   Procedure: ESOPHAGOGASTRODUODENOSCOPY (EGD);  Surgeon: Virgel Manifold, MD;  Location: [facility] END[facility]COPY;  Service: Endoscopy;  Laterality: N/A;   JOINT REPLACEMENT     LESION DESTRUCTION N/A 08/18/2021   Procedure: BIOPSY OF ANORECTAL ULCER;  Surgeon: Ileana Roup, MD;  Location: [place];  Service: General;  Laterality: N/A;   RECTAL PROLAPSE REPAIR     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   TONSILLECTOMY     VARICOSE VEIN SURGERY      OB History   No obstetric history on file.      Home Medications    Prior to Admission medications   Medication Sig Start Date End Date Taking? Authorizing Provider  HYDROcodone-acetaminophen (NORCO/VICODIN) 5-325 MG tablet Take 1 tablet by mouth every 6 (six) hours as needed. 07/25/22  Yes Margarette Canada, NP  Accu-Chek Softclix  Lancets lancets  03/29/22   [provider]  Alcohol Swabs (B-D SINGLE USE SWABS REGULAR) PADS USE DAILY. 03/19/20   Glean Hess, MD  allopurinol (ZYLOPRIM) 300 MG tablet TAKE 1 TABLET EVERY DAY 09/30/21   Glean Hess, MD  azelastine (ASTELIN) 0.1 % nasal spray Place 1 spray into both nostrils 2 (two) times daily. Use in each nostril as directed 03/09/21   Glean Hess, MD  Calcium Carb-Ergocalciferol 500-200 MG-UNIT TABS Take 1 tablet by mouth daily.    [provider]  celecoxib (CELEBREX) 200 MG capsule Take 1 capsule (200  mg total) by mouth 2 (two) times daily. 06/29/22   Glean Hess, MD  colchicine (COLCRYS) 0.6 MG tablet Take 1 tablet (0.6 mg total) by mouth 2 (two) times daily. 07/06/21   Glean Hess, MD  fexofenadine (ALLEGRA) 60 MG tablet Take 60 mg by mouth 2 (two) times daily.    [provider]  FIBER PO Take 1 capsule by mouth in the morning and at bedtime.    [provider]  fluticasone (FLONASE) 50 MCG/ACT nasal spray Place 1 spray into both nostrils daily. 02/12/15   [provider]  gabapentin (NEURONTIN) 100 MG capsule TAKE 2 CAPSULES TWICE DAILY 03/29/22   Glean Hess, MD  glucose blood (TRUE METRIX BLOOD GLUCOSE TEST) test strip TEST BLOOD SUGAR EVERY DAY 03/29/22   Glean Hess, MD  Lancet Devices [facility]) lancets 1 each by Other route daily. Use as instructed 03/12/15   Glean Hess, MD  Lancets Misc. (ACCU-CHEK SOFTCLIX LANCET DEV) KIT 1 each by Does not apply route daily. 04/07/21   Glean Hess, MD  losartan (COZAAR) 100 MG tablet Take 1 tablet (100 mg total) by mouth daily. 04/11/22   Glean Hess, MD  metFORMIN (GLUCOPHAGE-XR) 500 MG 24 hr tablet TAKE 1 TABLET EVERY DAY WITH BREAKFAST 05/18/22   Glean Hess, MD  metoprolol succinate (TOPROL-XL) 50 MG 24 hr tablet TAKE 3 TABLETS DAILY WITH OR IMMEDIATELY FOLLOWING A MEAL 05/18/22   Glean Hess, MD  mupirocin ointment (BACTROBAN) 2 % PLACE 1 APPLICATION INTO THE NOSE 2 TIMES A DAILY. 08/11/21   Glean Hess, MD  omeprazole (PRILOSEC) 20 MG capsule Take 1 capsule (20 mg total) by mouth daily. 06/29/22   Glean Hess, MD  simvastatin (ZOCOR) 40 MG tablet TAKE 1 TABLET AT BEDTIME 09/30/21   Glean Hess, MD  traMADol (ULTRAM) 50 MG tablet TAKE 1 TABLET BY MOUTH EVERY 6 HOURS AS NEEDED 07/08/22   Glean Hess, MD  vitamin C (ASCORBIC ACID) 500 MG tablet Take 500 mg by mouth daily.    [provider]  Vitamin D, Ergocalciferol, (DRISDOL) 1.25 MG  (50000 UNIT) CAPS capsule Take 1 capsule (50,000 Units total) by mouth every 7 (seven) days. Take for 8 total doses(weeks) 08/11/21   Montel Culver, MD    Family History Family History  Problem Relation Age of Onset   CAD Father        died age 91   COPD Mother    Diabetes Maternal Grandmother    Breast cancer Neg Hx     Social History Social History   Tobacco Use   Smoking status: Former    Packs/day: 0.50    Years: 30.00    Total pack years: 15.00    Types: Cigarettes    Quit date: 08/04/2018    Years since quitting: 3.9  Smokeless tobacco: Never   Tobacco comments:    Patient declines Lung Cancer Screening.  Vaping Use   Vaping Use: Never used  Substance Use Topics   Alcohol use: Yes    Comment: rarely   Drug use: Never     Allergies   Glipizide   Review of Systems Review of Systems  Respiratory:  Positive for cough and shortness of breath.   Cardiovascular:  Positive for chest pain.       Right chest wall pain.  Musculoskeletal:  Positive for arthralgias. Negative for joint swelling.  Neurological:  Negative for weakness and numbness.     Physical Exam Triage Vital Signs ED Triage Vitals  Enc Vitals Group     BP      Pulse      Resp      Temp      Temp src      SpO2      Weight      Height      Head Circumference      Peak Flow      Pain Score      Pain Loc      Pain Edu?      Excl. in [place]?    No data found.  Updated Vital Signs BP (!) 179/81 (BP Location: Right Arm)   Pulse 73   Temp (!) 97.3 F (36.3 C) (Oral)   Resp 16   LMP 09/27/1995   SpO2 93%   Visual Acuity Right Eye Distance:   Left Eye Distance:   Bilateral Distance:    Right Eye Near:   Left Eye Near:    Bilateral Near:     Physical Exam Vitals and nursing note reviewed.  Constitutional:      Appearance: Normal appearance. She is not ill-appearing.  HENT:     Head: Normocephalic and atraumatic.  Cardiovascular:     Rate and Rhythm: Normal rate and regular  rhythm.     Pulses: Normal pulses.     Heart sounds: Normal heart sounds. No murmur heard.    No friction rub. No gallop.  Pulmonary:     Effort: Pulmonary effort is normal.     Breath sounds: Normal breath sounds. No wheezing, rhonchi or rales.  Chest:     Chest wall: Tenderness present.  Musculoskeletal:        General: Tenderness and signs of injury present. No swelling or deformity.  Skin:    General: Skin is warm and dry.     Capillary Refill: Capillary refill takes less than 2 seconds.     Findings: Bruising present.  Neurological:     General: No focal deficit present.     Mental Status: She is alert and oriented to person, place, and time.  Psychiatric:        Mood and Affect: Mood normal.        Behavior: Behavior normal.        Thought Content: Thought content normal.        Judgment: Judgment normal.      UC Treatments / Results  Labs (all labs ordered are listed, but only abnormal results are displayed) Labs Reviewed - No data to display  EKG   Radiology DG Shoulder Right  Result Date: 07/25/2022 CLINICAL DATA:  [place] 4 days ago, RIGHT side anterior mid rib pain just under breast area, shortness of breath, and RIGHT shoulder pain EXAM: RIGHT SHOULDER - 2+ VIEW COMPARISON:  None Available. FINDINGS: Osseous demineralization.  AC joint alignment normal. No glenohumeral fracture, dislocation, or bone destruction. Subtle RIGHT rib fracture seen on rib radiographs not identified on this exam. IMPRESSION: No acute abnormalities. Electronically Signed   By: Lavonia Dana M.D.   On: 07/25/2022 17:04   DG Ribs Unilateral W/Chest Right  Result Date: 07/25/2022 CLINICAL DATA:  [place] 4 days ago, RIGHT side anterior mid rib pain just under breast area, shortness of breath, and RIGHT shoulder pain EXAM: RIGHT RIBS AND CHEST - 3+ VIEW COMPARISON:  Chest radiographs 10/24/2018 FINDINGS: Normal heart size, mediastinal contours, and pulmonary vascularity. Atherosclerotic calcification  aorta. Lungs appear emphysematous with subsegmental atelectasis at RIGHT base. No acute infiltrate, pleural effusion, or pneumothorax. Osseous demineralization with multiple old LEFT healed rib fractures. BB placed at site of symptoms at RIGHT ribs. Nondisplaced fractures of the anterolateral RIGHT sixth and seventh ribs. IMPRESSION: Emphysematous changes without acute infiltrate or pneumothorax. Nondisplaced fractures of the lateral LEFT sixth and seventh ribs. Aortic Atherosclerosis (ICD10-I70.0) and Emphysema (ICD10-J43.9). Electronically Signed   By: Lavonia Dana M.D.   On: 07/25/2022 17:03    Procedures Procedures (including critical care time)  Medications Ordered in UC Medications - No data to display  Initial Impression / Assessment and Plan / UC Course  I have reviewed the triage vital signs and the nursing notes.  Pertinent labs & imaging results that were available during my care of the patient were reviewed by me and considered in my medical decision making (see chart for details).   Patient is a pleasant, nontoxic-appearing 67 year old female here for evaluation of right shoulder and right anterior chest wall pain status post ground-level fall 4 days ago.  She is complaining of pain in the anterior aspect of her shoulder and she is tender to palpation but no crepitus is appreciated.  There is also no edema or ecchymosis noted.  She does not have any tenderness with palpation of her clavicle or with palpation of the acromion process.  She does have tenderness over the anterior aspect of the glenohumeral joint however.  She does not have any tenderness to palpation of the shaft of the humerus, elbow joint, radius or ulna of the right arm.  Grip in the right hand is 5/5 and her cap refill is less than 2 seconds.  Her hand is warm and dry.  Radial and ulnar pulses are 2+.  Lung sounds are clear to auscultation in all fields.  She does have a small penny sized area of ecchymosis on the anterior  lateral chest wall over the sixth rib.  It is tender to palpation but there is no crepitus appreciated on exam.  I will obtain x-rays of the right shoulder and right ribs to look for the presence of any bony abnormality.  Radiology impression of right rib films states that there are emphysematous changes without acute infiltrate or pneumothorax.  There are nondisplaced fractures of the lateral left sixth and seventh ribs.  Impression of right shoulder films states no glenohumeral fracture, dislocation, or bone destruction.  No acute abnormalities.  I will discharge patient home with a diagnosis of right rib fractures and encouraged her to take 10 deep breaths every hour to help keep her lungs inflated and prevent pneumonia from setting in.  I will also provide her a short prescription for Norco that she can use in place of her tramadol to help with her back pain and rib pain.  I will encourage her to splint her ribs whenever she coughs or sneezes.  Also when taking a deep breath provide pain relief.   Final Clinical Impressions(s) / UC Diagnoses   Final diagnoses:  Closed fracture of multiple ribs of right side, initial encounter  Contusion of right shoulder, initial encounter     Discharge Instructions      Your x-rays show that you have fractured your sixth and seventh rib on the right side.  Your shoulder x-rays were normal.  You may continue to use your tramadol twice daily as needed for pain in your ribs.  I have written you for some Norco that you can use for more severe pain or at bedtime as needed for pain in your ribs.  Do not take the Norco and your tramadol together.  Also do not drink alcohol or drive while taking the Norco.  You need to take 10 deep breaths every hour to keep your lungs inflated and prevent pneumonia from occurring.  You can purchase a rib belt from [employer] that you can wear to help give yourself some support.  I would recommend bracing your ribs with a pillow  or your arm whenever you cough, sneeze, or take a deep breath.  If you develop any increasing pain, shortness of breath, fevers, or start coughing up sputum either return for reevaluation, see your primary care provider, or seek care in the ER.     ED Prescriptions     Medication Sig Dispense Auth. Provider   HYDROcodone-acetaminophen (NORCO/VICODIN) 5-325 MG tablet Take 1 tablet by mouth every 6 (six) hours as needed. 16 tablet Margarette Canada, NP      I have reviewed the PDMP during this encounter.   Margarette Canada, NP 07/25/22 1712

## 2022-07-27 ENCOUNTER — Ambulatory Visit: Payer: Medicare HMO | Admitting: Internal Medicine

## 2022-07-28 ENCOUNTER — Other Ambulatory Visit: Payer: Self-pay | Admitting: Internal Medicine

## 2022-07-28 DIAGNOSIS — K219 Gastro-esophageal reflux disease without esophagitis: Secondary | ICD-10-CM

## 2022-08-15 ENCOUNTER — Telehealth: Payer: Self-pay

## 2022-08-15 NOTE — Telephone Encounter (Signed)
Patient canceled her appt in January for a follow up for HTN with Dr Army Melia. Patient needs to reschedule her follow up. Please call and schedule.  Thank you.

## 2022-08-22 ENCOUNTER — Other Ambulatory Visit: Payer: Self-pay | Admitting: Internal Medicine

## 2022-08-22 DIAGNOSIS — K56609 Unspecified intestinal obstruction, unspecified as to partial versus complete obstruction: Secondary | ICD-10-CM | POA: Diagnosis not present

## 2022-08-22 DIAGNOSIS — R000 Tachycardia, unspecified: Secondary | ICD-10-CM | POA: Diagnosis not present

## 2022-08-22 DIAGNOSIS — J439 Emphysema, unspecified: Secondary | ICD-10-CM | POA: Diagnosis not present

## 2022-08-22 DIAGNOSIS — K219 Gastro-esophageal reflux disease without esophagitis: Secondary | ICD-10-CM | POA: Diagnosis not present

## 2022-08-22 DIAGNOSIS — E1169 Type 2 diabetes mellitus with other specified complication: Secondary | ICD-10-CM

## 2022-08-22 DIAGNOSIS — Z20822 Contact with and (suspected) exposure to covid-19: Secondary | ICD-10-CM | POA: Diagnosis not present

## 2022-08-22 DIAGNOSIS — I10 Essential (primary) hypertension: Secondary | ICD-10-CM | POA: Diagnosis not present

## 2022-08-22 DIAGNOSIS — R109 Unspecified abdominal pain: Secondary | ICD-10-CM | POA: Diagnosis not present

## 2022-08-22 DIAGNOSIS — R918 Other nonspecific abnormal finding of lung field: Secondary | ICD-10-CM | POA: Diagnosis not present

## 2022-08-22 DIAGNOSIS — R112 Nausea with vomiting, unspecified: Secondary | ICD-10-CM | POA: Diagnosis not present

## 2022-08-22 DIAGNOSIS — R053 Chronic cough: Secondary | ICD-10-CM | POA: Diagnosis not present

## 2022-08-22 DIAGNOSIS — R0902 Hypoxemia: Secondary | ICD-10-CM | POA: Diagnosis not present

## 2022-08-22 DIAGNOSIS — M10071 Idiopathic gout, right ankle and foot: Secondary | ICD-10-CM

## 2022-08-22 DIAGNOSIS — R54 Age-related physical debility: Secondary | ICD-10-CM | POA: Diagnosis not present

## 2022-08-22 DIAGNOSIS — R1084 Generalized abdominal pain: Secondary | ICD-10-CM | POA: Diagnosis not present

## 2022-08-22 DIAGNOSIS — M109 Gout, unspecified: Secondary | ICD-10-CM | POA: Diagnosis not present

## 2022-08-22 DIAGNOSIS — K56699 Other intestinal obstruction unspecified as to partial versus complete obstruction: Secondary | ICD-10-CM | POA: Diagnosis not present

## 2022-08-22 DIAGNOSIS — R059 Cough, unspecified: Secondary | ICD-10-CM | POA: Diagnosis not present

## 2022-08-22 DIAGNOSIS — M549 Dorsalgia, unspecified: Secondary | ICD-10-CM | POA: Diagnosis not present

## 2022-08-22 DIAGNOSIS — J9811 Atelectasis: Secondary | ICD-10-CM | POA: Diagnosis not present

## 2022-08-22 DIAGNOSIS — E119 Type 2 diabetes mellitus without complications: Secondary | ICD-10-CM | POA: Diagnosis not present

## 2022-08-23 NOTE — Telephone Encounter (Signed)
Requested Prescriptions  Pending Prescriptions Disp Refills   simvastatin (ZOCOR) 40 MG tablet [Pharmacy Med Name: SIMVASTATIN 40 MG Tablet] 90 tablet 0    Sig: TAKE 1 TABLET AT BEDTIME     Cardiovascular:  Antilipid - Statins Failed - 08/22/2022  2:17 AM      Failed - Lipid Panel in normal range within the last 12 months    Cholesterol, Total  Date Value Ref Range Status  04/07/2020 111 100 - 199 mg/dL Final   Cholesterol  Date Value Ref Range Status  03/15/2022 109 0 - 200 mg/dL Final   LDL Chol Calc (NIH)  Date Value Ref Range Status  04/07/2020 39 0 - 99 mg/dL Final   LDL Cholesterol  Date Value Ref Range Status  03/15/2022 55 0 - 99 mg/dL Final    Comment:           Total Cholesterol/HDL:CHD Risk Coronary Heart Disease Risk Table                     Men   Women  1/2 Average Risk   3.4   3.3  Average Risk       5.0   4.4  2 X Average Risk   9.6   7.1  3 X Average Risk  23.4   11.0        Use the calculated Patient Ratio above and the CHD Risk Table to determine the patient's CHD Risk.        ATP III CLASSIFICATION (LDL):  <100     mg/dL   Optimal  100-129  mg/dL   Near or Above                    Optimal  130-159  mg/dL   Borderline  160-189  mg/dL   High  >190     mg/dL   Very High Performed at [facility], [place], [place] [address]    HDL  Date Value Ref Range Status  03/15/2022 42 >40 mg/dL Final  04/07/2020 44 >39 mg/dL Final   Triglycerides  Date Value Ref Range Status  03/15/2022 62 <150 mg/dL Final         Passed - Patient is not pregnant      Passed - Valid encounter within last 12 months    Recent Outpatient Visits           4 months ago Muscle cramps at night   [place] at [facility], Jesse Sans, MD   5 months ago Primary osteoarthritis of left knee   [facility] [place] [facility] [place], Earley Abide, MD   5 months ago Annual  physical exam   [place] at [facility], Jesse Sans, MD   7 months ago Cellulitis of toe of right foot   [place] at [facility], Jesse Sans, MD   10 months ago Acute non-recurrent maxillary sinusitis   [facility] [place] [facility], Jesse Sans, MD       Future Appointments             In 1 week Army Melia, Jesse Sans, MD [place] at [facility]             allopurinol (ZYLOPRIM) 300 MG  tablet [Pharmacy Med Name: ALLOPURINOL 300 MG Tablet] 90 tablet 0    Sig: TAKE 1 TABLET EVERY DAY     Endocrinology:  Gout Agents - allopurinol Failed - 08/22/2022  2:17 AM      Failed - Uric Acid in normal range and within 360 days    Uric Acid  Date Value Ref Range Status  07/23/2021 5.6 3.0 - 7.2 mg/dL Final    Comment:               Therapeutic target for gout patients: <6.0         Failed - Cr in normal range and within 360 days    Creatinine, Ser  Date Value Ref Range Status  04/11/2022 1.11 (H) 0.57 - 1.00 mg/dL Final         Passed - Valid encounter within last 12 months    Recent Outpatient Visits           4 months ago Muscle cramps at night   [place] at [facility], Jesse Sans, MD   5 months ago Primary osteoarthritis of left knee   [place] [facility] [place], Earley Abide, MD   5 months ago Annual physical exam   [place] at [facility], Jesse Sans, MD   7 months ago Cellulitis of toe of right foot   [place] at [facility], Jesse Sans, MD   10 months ago Acute non-recurrent maxillary sinusitis   [place] at [facility], Jesse Sans, MD       Future Appointments             In 1 week  Army Melia Jesse Sans, MD [place] at [facility] [place] within normal limits and completed in the last 12 months    WBC  Date Value Ref Range Status  03/15/2022 8.3 4.0 - 10.5 K/uL Final   RBC  Date Value Ref Range Status  03/15/2022 3.27 (L) 3.87 - 5.11 MIL/uL Final   Hemoglobin  Date Value Ref Range Status  03/15/2022 10.4 (L) 12.0 - 15.0 g/dL Final  07/23/2021 9.7 (L) 11.1 - 15.9 g/dL Final   HCT  Date Value Ref Range Status  03/15/2022 32.0 (L) 36.0 - 46.0 % Final   Hematocrit  Date Value Ref Range Status  07/23/2021 28.6 (L) 34.0 - 46.6 % Final   MCHC  Date Value Ref Range Status  03/15/2022 32.5 30.0 - 36.0 g/dL Final   [facility]  Date Value Ref Range Status  03/15/2022 31.8 26.0 - 34.0 pg Final   MCV  Date Value Ref Range Status  03/15/2022 97.9 80.0 - 100.0 fL Final  07/23/2021 96 79 - 97 fL Final   No results found for: "PLTCOUNTKUC", "LABPLAT", "POCPLA" RDW  Date Value Ref Range Status  03/15/2022 20.6 (H) 11.5 - 15.5 % Final  07/23/2021 20.2 (H) 11.7 - 15.4 % Final

## 2022-08-27 ENCOUNTER — Other Ambulatory Visit: Payer: Self-pay | Admitting: Internal Medicine

## 2022-08-27 DIAGNOSIS — E118 Type 2 diabetes mellitus with unspecified complications: Secondary | ICD-10-CM

## 2022-08-27 DIAGNOSIS — I10 Essential (primary) hypertension: Secondary | ICD-10-CM

## 2022-08-29 ENCOUNTER — Other Ambulatory Visit: Payer: Self-pay | Admitting: Internal Medicine

## 2022-08-29 ENCOUNTER — Telehealth: Payer: Self-pay | Admitting: *Deleted

## 2022-08-29 DIAGNOSIS — E118 Type 2 diabetes mellitus with unspecified complications: Secondary | ICD-10-CM

## 2022-08-29 MED ORDER — TRUEDRAW LANCING DEVICE MISC: 1.0000 | Freq: Three times a day (TID) | 3 refills | Status: DC | PRN

## 2022-08-29 MED ORDER — TRUE METRIX BLOOD GLUCOSE TEST VI STRP: 1.0000 | ORAL_STRIP | Freq: Two times a day (BID) | 3 refills | Status: DC

## 2022-08-29 MED ORDER — TRUEPLUS LANCETS 30G MISC: 1.0000 | Freq: Two times a day (BID) | 3 refills | Status: AC

## 2022-08-29 NOTE — Transitions of Care (Post Inpatient/ED Visit) (Signed)
   08/29/2022  Name: Kiara Campbell MRN: [id number] DOB: 17-Sep-1955  Today's TOC FU Call Status: Today's TOC FU Call Status:: Unsuccessul Call (1st Attempt)  Attempted to reach the patient regarding the most recent Inpatient/ED visit.  Follow Up Plan: Additional outreach attempts will be made to reach the patient to complete the Transitions of Care (Post Inpatient/ED visit) call.   [facility] [phone]

## 2022-09-02 ENCOUNTER — Ambulatory Visit: Payer: Medicare HMO | Admitting: Internal Medicine

## 2022-09-05 ENCOUNTER — Encounter: Payer: Self-pay | Admitting: Internal Medicine

## 2022-09-05 ENCOUNTER — Other Ambulatory Visit: Payer: Self-pay | Admitting: Internal Medicine

## 2022-09-05 ENCOUNTER — Ambulatory Visit (INDEPENDENT_AMBULATORY_CARE_PROVIDER_SITE_OTHER): Payer: Medicare HMO | Admitting: Internal Medicine

## 2022-09-05 VITALS — BP 132/72 | HR 70 | Ht 66.0 in | Wt 179.4 lb

## 2022-09-05 DIAGNOSIS — M810 Age-related osteoporosis without current pathological fracture: Secondary | ICD-10-CM

## 2022-09-05 DIAGNOSIS — J0100 Acute maxillary sinusitis, unspecified: Secondary | ICD-10-CM

## 2022-09-05 DIAGNOSIS — I10 Essential (primary) hypertension: Secondary | ICD-10-CM

## 2022-09-05 DIAGNOSIS — K56609 Unspecified intestinal obstruction, unspecified as to partial versus complete obstruction: Secondary | ICD-10-CM | POA: Diagnosis not present

## 2022-09-05 DIAGNOSIS — E118 Type 2 diabetes mellitus with unspecified complications: Secondary | ICD-10-CM | POA: Diagnosis not present

## 2022-09-05 DIAGNOSIS — S92352D Displaced fracture of fifth metatarsal bone, left foot, subsequent encounter for fracture with routine healing: Secondary | ICD-10-CM | POA: Diagnosis not present

## 2022-09-05 DIAGNOSIS — N1831 Chronic kidney disease, stage 3a: Secondary | ICD-10-CM

## 2022-09-05 DIAGNOSIS — M5116 Intervertebral disc disorders with radiculopathy, lumbar region: Secondary | ICD-10-CM | POA: Diagnosis not present

## 2022-09-05 MED ORDER — TRAMADOL HCL 50 MG PO TABS: 50.0000 mg | ORAL_TABLET | Freq: Two times a day (BID) | ORAL | 2 refills | Status: DC | PRN

## 2022-09-05 MED ORDER — AMOXICILLIN-POT CLAVULANATE 875-125 MG PO TABS: 1.0000 | ORAL_TABLET | Freq: Two times a day (BID) | ORAL | 0 refills | Status: AC

## 2022-09-05 NOTE — Assessment & Plan Note (Signed)
Monitoring regularly Avoiding nsaids - on tramadol for end stage OA

## 2022-09-05 NOTE — Assessment & Plan Note (Signed)
Clinically stable exam with well controlled BP on metoprolol and losartan. Tolerating medications without side effects. Pt to continue current regimen and low sodium diet.

## 2022-09-05 NOTE — Assessment & Plan Note (Signed)
Clinically stable without s/s of hypoglycemia. Tolerating metformin well without side effects or other concerns. Lab Results  Component Value Date   HGBA1C 6.5 (H) 03/15/2022

## 2022-09-05 NOTE — Progress Notes (Signed)
Date:  09/05/2022   Name:  Kiara Campbell   DOB:  Apr 27, 1956   MRN:  [id number]   Chief Complaint: Hypertension, Diabetes, and Osteoporosis (DEXA) Hospital follow up.  [facility]  2/19 to 08/26/22.  TOC call done on 08/27/22. Hospital Course: Kiara Campbell is a 67 y.o. female with a history of hysterectomy in 1997 2/2 fibroids and AUB, rectopexy 2008 for anal incontinence, constipation, HTN, DM2, diverticulitis, GERD, gout, and 30 pack year smoking hx who presents for 24 hours of N/V and anorexia found to have SBO with transition point on 2/19. Patient was treated conservatively with bowel rest, maintenance IV fluid and no NG tube. Diet was initially transitioned and advanced on 2/21, however patient had pain with eating a regular diet. Diet was back down to clear liquid diet and attempts were made to advance diet again on 2/22. She was initially transitioned to full liquid diet during the morning and transition to regular diet during the evening. On morning of discharge, she was tolerating a regular diet with no issues. No nausea, vomiting, abdominal pain, or increased abdominal distention associated with it. The patient was able to void spontaneously, have her pain controlled with P.O. pain medication, and ambulate well with minimal assistance. She was having bowel movements by the day of discharge. She is being discharged home on HD 4 in stable condition.  >>>  Since discharge she has done well, appetite is normal, no further pain or diarrhea. Still has sinus symptoms and second toe on right is still tender.  Hypertension This is a chronic problem. The problem is controlled. Pertinent negatives include no chest pain, headaches, palpitations or shortness of breath. Past treatments include beta blockers and angiotensin blockers. The current treatment provides significant improvement. Hypertensive end-organ damage includes kidney disease. There is no history of CAD/MI or CVA.  Diabetes She  presents for her follow-up diabetic visit. She has type 2 diabetes mellitus. Her disease course has been stable. Pertinent negatives for hypoglycemia include no dizziness, headaches or nervousness/anxiousness. Pertinent negatives for diabetes include no chest pain, no fatigue and no weakness. Pertinent negatives for diabetic complications include no CVA. Current diabetic treatment includes oral agent (monotherapy) (metformin).  Foot fracture/OP - last DEXA was in 2020.  Due for repeat imaging.  She was treated for 5 years with Boniva ending around 2020.  She also had a recent fall and rib fractures.  Lab Results  Component Value Date   NA 142 04/11/2022   K 4.7 04/11/2022   CO2 23 04/11/2022   GLUCOSE 104 (H) 04/11/2022   BUN 30 (H) 04/11/2022   CREATININE 1.11 (H) 04/11/2022   CALCIUM 10.3 04/11/2022   EGFR 54 (L) 04/11/2022   GFRNONAA 52 (L) 03/15/2022   Lab Results  Component Value Date   CHOL 109 03/15/2022   HDL 42 03/15/2022   LDLCALC 55 03/15/2022   TRIG 62 03/15/2022   CHOLHDL 2.6 03/15/2022   Lab Results  Component Value Date   TSH 1.797 03/15/2022   Lab Results  Component Value Date   HGBA1C 6.5 (H) 03/15/2022   Lab Results  Component Value Date   WBC 8.3 03/15/2022   HGB 10.4 (L) 03/15/2022   HCT 32.0 (L) 03/15/2022   MCV 97.9 03/15/2022   PLT 288 03/15/2022   Lab Results  Component Value Date   ALT 25 03/15/2022   AST 23 03/15/2022   ALKPHOS 79 03/15/2022   BILITOT 0.7 03/15/2022   No results found  for: "25OHVITD2", "25OHVITD3", "VD25OH"   Review of Systems  Constitutional:  Negative for fatigue and unexpected weight change.  HENT:  Negative for nosebleeds.   Eyes:  Negative for visual disturbance.  Respiratory:  Negative for cough, chest tightness, shortness of breath and wheezing.   Cardiovascular:  Negative for chest pain, palpitations and leg swelling.  Gastrointestinal:  Negative for abdominal pain, constipation and diarrhea.  Neurological:   Negative for dizziness, weakness, light-headedness and headaches.  Psychiatric/Behavioral:  Negative for dysphoric mood and sleep disturbance. The patient is not nervous/anxious.     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   Tobacco use disorder, severe, in early remission 08/09/2017   Obesity (BMI 30-39.9) 07/17/2017   GERD (gastroesophageal reflux disease) 07/12/2017   Type II diabetes mellitus with complication ([place]) [id number]   Fibrocystic breast disease 12/03/2014   Colon polyp 12/03/2014   Diverticulitis of colon 12/03/2014   Essential (primary) hypertension 12/03/2014   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   OP (osteoporosis) 12/03/2014   Hyperlipidemia associated with type 2 diabetes mellitus ([place]) 12/03/2014    Allergies  Allergen Reactions   Glipizide Nausea Only    Past Surgical History:  Procedure Laterality Date   ABDOMINAL HYSTERECTOMY     partial   ABDOMINAL HYSTERECTOMY     APPENDECTOMY     BREAST CYST ASPIRATION     neg not sure which side   COLONOSCOPY WITH PROPOFOL N/A 02/03/2021   Procedure: COLONOSCOPY WITH PROPOFOL;  Surgeon: Virgel Manifold, MD;  Location: [facility] ENDOSCOPY;  Service: Endoscopy;  Laterality: N/A;   ESOPHAGOGASTRODUODENOSCOPY N/A 02/03/2021   Procedure: ESOPHAGOGASTRODUODENOSCOPY (EGD);  Surgeon: Virgel Manifold, MD;  Location: [facility] ENDOSCOPY;  Service: Endoscopy;  Laterality: N/A;   JOINT REPLACEMENT     LESION DESTRUCTION N/A 08/18/2021   Procedure: BIOPSY OF ANORECTAL ULCER;  Surgeon: Ileana Roup, MD;  Location: [facility];  Service: General;  Laterality: N/A;   RECTAL PROLAPSE REPAIR     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   TONSILLECTOMY     VARICOSE VEIN SURGERY      Social History   Tobacco Use   Smoking status: Former    Packs/day: 0.50    Years: 30.00    Total pack years: 15.00    Types: Cigarettes    Quit date: 08/04/2018    Years since quitting: 4.0   Smokeless tobacco: Never   Tobacco comments:    Patient declines Lung Cancer Screening.  Vaping Use   Vaping Use: Never used  Substance Use Topics   Alcohol use: Yes    Comment: rarely   Drug use: Never     Medication list has been reviewed and updated.  Current Meds  Medication Sig   Alcohol Swabs (B-D SINGLE USE SWABS REGULAR) PADS USE DAILY.   allopurinol (ZYLOPRIM) 300 MG tablet TAKE 1 TABLET EVERY DAY   amoxicillin-clavulanate (AUGMENTIN) 875-125 MG tablet Take 1 tablet by mouth 2 (two) times daily for 10 days.   azelastine (ASTELIN) 0.1 % nasal spray  Place 1 spray into both nostrils 2 (two) times daily. Use in each nostril as directed   Calcium Carb-Ergocalciferol 500-200 MG-UNIT TABS Take 1 tablet by mouth daily.   celecoxib (CELEBREX) 200 MG capsule Take 1 capsule (200 mg total) by mouth 2 (two) times daily.   colchicine (COLCRYS) 0.6 MG tablet Take 1 tablet (0.6 mg total) by mouth 2 (two) times daily.   fexofenadine (ALLEGRA) 60 MG tablet Take 60 mg by mouth 2 (two) times daily.   FIBER PO Take 1 capsule by mouth in the morning and at bedtime.   fluticasone (FLONASE) 50 MCG/ACT nasal spray Place 1 spray into both nostrils daily.   glucose blood (TRUE METRIX BLOOD GLUCOSE TEST) test strip 1 each by Other route 2 (two) times daily. TEST BLOOD SUGAR EVERY DAY   HYDROcodone-acetaminophen (NORCO/VICODIN) 5-325 MG tablet Take 1 tablet by mouth every 6 (six) hours as needed.   Lancet Devices (TRUEDRAW LANCING DEVICE) MISC 1 each by Does not apply route 3 (three) times daily  as needed.   Lancets Misc. (ACCU-CHEK SOFTCLIX LANCET DEV) KIT 1 each by Does not apply route daily.   losartan (COZAAR) 100 MG tablet Take 1 tablet (100 mg total) by mouth daily.   metFORMIN (GLUCOPHAGE-XR) 500 MG 24 hr tablet TAKE 1 TABLET EVERY DAY WITH BREAKFAST   metoprolol succinate (TOPROL-XL) 50 MG 24 hr tablet TAKE 3 TABLETS DAILY WITH OR IMMEDIATELY FOLLOWING A MEAL   mupirocin ointment (BACTROBAN) 2 % PLACE 1 APPLICATION INTO THE NOSE 2 TIMES A DAILY.   omeprazole (PRILOSEC) 20 MG capsule TAKE 1 CAPSULE EVERY DAY   simvastatin (ZOCOR) 40 MG tablet TAKE 1 TABLET AT BEDTIME   TRUEplus Lancets 30G MISC 1 each by Does not apply route 2 (two) times daily.   vitamin C (ASCORBIC ACID) 500 MG tablet Take 500 mg by mouth daily.   Vitamin D, Ergocalciferol, (DRISDOL) 1.25 MG (50000 UNIT) CAPS capsule Take 1 capsule (50,000 Units total) by mouth every 7 (seven) days. Take for 8 total doses(weeks)   [DISCONTINUED] gabapentin (NEURONTIN) 100 MG capsule TAKE 2 CAPSULES TWICE DAILY   [DISCONTINUED] traMADol (ULTRAM) 50 MG tablet TAKE 1 TABLET BY MOUTH EVERY 6 HOURS AS NEEDED       04/11/2022    3:40 PM 03/15/2022   10:47 AM 01/19/2022    3:04 PM 10/13/2021    2:58 PM  GAD 7 : Generalized Anxiety Score  Nervous, Anxious, on Edge 1 1 0 2  Control/stop worrying 0 0 0 2  Worry too much - different things 0 0 0 1  Trouble relaxing 2 2 0 0  Restless 0 2 0 0  Easily annoyed or irritable 2 2 0 1  Afraid - awful might happen 0 0 0 0  Total GAD 7 Score 5 7 0 6  Anxiety Difficulty Not difficult at all Somewhat difficult Not difficult at all Somewhat difficult       04/11/2022    3:40 PM 03/15/2022   10:47 AM 01/19/2022    3:04 PM  Depression screen PHQ 2/9  Decreased Interest '1 1 1  '$ Down, Depressed, Hopeless '1 1 1  '$ PHQ - 2 Score '2 2 2  '$ Altered sleeping '2 2 2  '$ Tired, decreased energy '3 3 3  '$ Change in appetite 0 1 0  Feeling bad or failure about yourself  0 1 0  Trouble concentrating 1 0 0   Moving slowly or fidgety/restless 0 1 0  Suicidal thoughts 0 0 0  PHQ-9 Score '8 10 7  '$ Difficult doing work/chores Not difficult at all Not difficult at all Not difficult at all    BP Readings from Last 3 Encounters:  09/05/22 132/72  07/25/22 (!) 179/81  04/11/22 128/74    Physical Exam Vitals and nursing note reviewed.  Constitutional:      General: She is not in acute distress.    Appearance: Normal appearance. She is well-developed.  HENT:     Head: Normocephalic and atraumatic.     Nose:     Right Sinus: Maxillary sinus tenderness present.     Left Sinus: Maxillary sinus tenderness present.  Cardiovascular:     Rate and Rhythm: Normal rate and regular rhythm.     Pulses: Normal pulses.     Heart sounds: No murmur heard. Pulmonary:     Effort: Pulmonary effort is normal. No respiratory distress.     Breath sounds: No wheezing or rhonchi.  Chest:     Chest wall: No tenderness or crepitus.  Musculoskeletal:     Cervical back: Normal range of motion.     Right lower leg: No edema.     Left lower leg: No edema.  Lymphadenopathy:     Cervical: No cervical adenopathy.  Skin:    General: Skin is warm and dry.     Findings: No rash.  Neurological:     General: No focal deficit present.     Mental Status: She is alert and oriented to person, place, and time.  Psychiatric:        Mood and Affect: Mood normal.        Behavior: Behavior normal.     Wt Readings from Last 3 Encounters:  09/05/22 179 lb 6.4 oz (81.4 kg)  04/11/22 179 lb (81.2 kg)  03/22/22 180 lb (81.6 kg)    BP 132/72 (BP Location: Right Arm, Cuff Size: Normal)   Pulse 70   Ht '5\' 6"'$  (1.676 m)   Wt 179 lb 6.4 oz (81.4 kg)   LMP 09/27/1995   SpO2 95%   BMI 28.96 kg/m   Assessment and Plan: Problem List Items Addressed This Visit       Cardiovascular and Mediastinum   Essential (primary) hypertension (Chronic)    Clinically stable exam with well controlled BP on metoprolol and  losartan. Tolerating medications without side effects. Pt to continue current regimen and low sodium diet.         Endocrine   Type II diabetes mellitus with complication (HCC) (Chronic)    Clinically stable without s/s of hypoglycemia. Tolerating metformin well without side effects or other concerns. Lab Results  Component Value Date   HGBA1C 6.5 (H) 03/15/2022        Relevant Orders   Hemoglobin A1c   Microalbumin / creatinine urine ratio     Nervous and Auditory   Lumbar disc herniation with radiculopathy   Relevant Medications   traMADol (ULTRAM) 50 MG tablet     Musculoskeletal and Integument   OP (osteoporosis)    Foot fracture last year as well as rib fractures from a fall Needs repeat DEXA and consideration of treatment      Relevant Orders   VITAMIN D 25 Hydroxy (Vit-D Deficiency, Fractures)     Genitourinary   Stage 3a chronic kidney disease ([place])    Monitoring regularly Avoiding nsaids - on tramadol for end stage OA      Relevant Orders   Basic metabolic panel   Other Visit Diagnoses  SBO (small bowel obstruction) ([place])    -  Primary   Now resolved and doing well No specific follow up is needed   Closed displaced fracture of fifth metatarsal bone of left foot with routine healing, subsequent encounter       Relevant Orders   DG Bone Density   Acute non-recurrent maxillary sinusitis       Relevant Medications   amoxicillin-clavulanate (AUGMENTIN) 875-125 MG tablet        Partially dictated using Editor, commissioning. Any errors are unintentional.  Halina Maidens, MD [facility]  09/05/2022

## 2022-09-05 NOTE — Assessment & Plan Note (Signed)
Foot fracture last year as well as rib fractures from a fall Needs repeat DEXA and consideration of treatment

## 2022-09-06 ENCOUNTER — Telehealth: Payer: Self-pay | Admitting: Internal Medicine

## 2022-09-06 LAB — BASIC METABOLIC PANEL
BUN/Creatinine Ratio: 21 (ref 12–28)
BUN: 21 mg/dL (ref 8–27)
CO2: 25 mmol/L (ref 20–29)
Calcium: 9.5 mg/dL (ref 8.7–10.3)
Chloride: 107 mmol/L — ABNORMAL HIGH (ref 96–106)
Creatinine, Ser: 1.02 mg/dL — ABNORMAL HIGH (ref 0.57–1.00)
Glucose: 117 mg/dL — ABNORMAL HIGH (ref 70–99)
Potassium: 4.5 mmol/L (ref 3.5–5.2)
Sodium: 146 mmol/L — ABNORMAL HIGH (ref 134–144)
eGFR: 60 mL/min/{1.73_m2} (ref >59)

## 2022-09-06 LAB — MICROALBUMIN / CREATININE URINE RATIO
Creatinine, Urine: 172.1 mg/dL
Microalb/Creat Ratio: 94 mg/g creat — ABNORMAL HIGH (ref 0–29)
Microalbumin, Urine: 162.3 ug/mL

## 2022-09-06 LAB — HEMOGLOBIN A1C
Est. average glucose Bld gHb Est-mCnc: 137 mg/dL
Hgb A1c MFr Bld: 6.4 % — ABNORMAL HIGH (ref 4.8–5.6)

## 2022-09-06 LAB — HM DIABETES EYE EXAM

## 2022-09-06 LAB — VITAMIN D 25 HYDROXY (VIT D DEFICIENCY, FRACTURES): Vit D, 25-Hydroxy: 48.8 ng/mL (ref 30.0–100.0)

## 2022-09-06 NOTE — Telephone Encounter (Signed)
Copied from [place] [id number]. Topic: General - Other >> Sep 06, 2022  3:46 PM Dominique A wrote: Reason for CRM: Primitivo Gauze from [facility] is calling to get clarification for glucose blood (TRUE METRIX BLOOD GLUCOSE TEST) test strip. She is needing to know if pt test one time a day or two times a day and if someone could confirm with their pharmacists.  Please advise.

## 2022-09-07 ENCOUNTER — Other Ambulatory Visit: Payer: Self-pay

## 2022-09-07 DIAGNOSIS — E118 Type 2 diabetes mellitus with unspecified complications: Secondary | ICD-10-CM

## 2022-09-07 MED ORDER — TRUE METRIX BLOOD GLUCOSE TEST VI STRP: ORAL_STRIP | 3 refills | Status: AC

## 2022-09-07 NOTE — Telephone Encounter (Signed)
Resent strips to once per day.

## 2022-09-08 NOTE — Progress Notes (Signed)
PC to pt discussed lab results, voiced understanding. Pt will call back if she is needing a Derm referral to Dr. Phillip Heal.

## 2022-09-15 ENCOUNTER — Other Ambulatory Visit: Payer: Medicare HMO

## 2022-09-23 ENCOUNTER — Telehealth: Payer: Self-pay | Admitting: Internal Medicine

## 2022-09-23 NOTE — Telephone Encounter (Signed)
Copied from [place]. Topic: General - Other >> Sep 23, 2022  8:22 AM Sabas Sous wrote: Reason for CRM: Deena RN from [facility] shares that she is faxing a Dexa Scan due to a recent fracture right now.

## 2022-10-27 ENCOUNTER — Ambulatory Visit
Admission: RE | Admit: 2022-10-27 | Discharge: 2022-10-27 | Disposition: A | Discharge status: Home / Self Care | Payer: Medicare HMO | Source: Ambulatory Visit | Attending: Internal Medicine | Admitting: Internal Medicine

## 2022-10-27 DIAGNOSIS — E559 Vitamin D deficiency, unspecified: Secondary | ICD-10-CM | POA: Diagnosis not present

## 2022-10-27 DIAGNOSIS — M8589 Other specified disorders of bone density and structure, multiple sites: Secondary | ICD-10-CM | POA: Diagnosis not present

## 2022-10-27 DIAGNOSIS — S92352D Displaced fracture of fifth metatarsal bone, left foot, subsequent encounter for fracture with routine healing: Secondary | ICD-10-CM | POA: Diagnosis present

## 2022-10-27 DIAGNOSIS — Z780 Asymptomatic menopausal state: Secondary | ICD-10-CM | POA: Diagnosis not present

## 2022-11-04 ENCOUNTER — Other Ambulatory Visit: Payer: Self-pay | Admitting: Internal Medicine

## 2022-11-04 DIAGNOSIS — E1169 Type 2 diabetes mellitus with other specified complication: Secondary | ICD-10-CM

## 2022-11-04 DIAGNOSIS — M10071 Idiopathic gout, right ankle and foot: Secondary | ICD-10-CM

## 2022-11-04 NOTE — Telephone Encounter (Signed)
Requested medication (s) are due for refill today: yes  Requested medication (s) are on the active medication list: yes  Last refill:  08/23/22 #90 0 refills   Future visit scheduled: no   Notes to clinic:  no refills remain. Do you want to refill for 1 year? Last labs 03/15/22 zocor and 07/23/21 allopurinol     Requested Prescriptions  Pending Prescriptions Disp Refills   simvastatin (ZOCOR) 40 MG tablet [Pharmacy Med Name: SIMVASTATIN 40 MG Tablet] 90 tablet 3    Sig: TAKE 1 TABLET AT BEDTIME     Cardiovascular:  Antilipid - Statins Failed - 11/04/2022  2:10 AM      Failed - Lipid Panel in normal range within the last 12 months    Cholesterol, Total  Date Value Ref Range Status  04/07/2020 111 100 - 199 mg/dL Final   Cholesterol  Date Value Ref Range Status  03/15/2022 109 0 - 200 mg/dL Final   LDL Chol Calc (NIH)  Date Value Ref Range Status  04/07/2020 39 0 - 99 mg/dL Final   LDL Cholesterol  Date Value Ref Range Status  03/15/2022 55 0 - 99 mg/dL Final    Comment:           Total Cholesterol/HDL:CHD Risk Coronary Heart Disease Risk Table                     Men   Women  1/2 Average Risk   3.4   3.3  Average Risk       5.0   4.4  2 X Average Risk   9.6   7.1  3 X Average Risk  23.4   11.0        Use the calculated Patient Ratio above and the CHD Risk Table to determine the patient's CHD Risk.        ATP III CLASSIFICATION (LDL):  <100     mg/dL   Optimal  161-096  mg/dL   Near or Above                    Optimal  130-159  mg/dL   Borderline  045-409  mg/dL   High  >811     mg/dL   Very High Performed at [facility], [address] [place], [place] [address]    HDL  Date Value Ref Range Status  03/15/2022 42 >40 mg/dL Final  29/56/2130 44 >86 mg/dL Final   Triglycerides  Date Value Ref Range Status  03/15/2022 62 <150 mg/dL Final         Passed - Patient is not pregnant      Passed - Valid encounter within last 12 months    Recent  Outpatient Visits           2 months ago SBO (small bowel obstruction) (HCC)   [place] [facility] at [facility], Nyoka Cowden, MD   6 months ago Muscle cramps at night   [facility] at [facility], Nyoka Cowden, MD   7 months ago Primary osteoarthritis of left knee   [facility] at [facility], Ocie Bob, MD   7 months ago Annual physical exam   [facility] at [facility], Nyoka Cowden, MD   9 months ago Cellulitis of toe of right foot   [facility] Primary Care &  Sports Medicine at [employer], Nyoka Cowden, MD               allopurinol (ZYLOPRIM) 300 MG tablet [Pharmacy Med Name: ALLOPURINOL 300 MG Tablet] 90 tablet 3    Sig: TAKE 1 TABLET EVERY DAY     Endocrinology:  Gout Agents - allopurinol Failed - 11/04/2022  2:10 AM      Failed - Uric Acid in normal range and within 360 days    Uric Acid  Date Value Ref Range Status  07/23/2021 5.6 3.0 - 7.2 mg/dL Final    Comment:               Therapeutic target for gout patients: <6.0         Failed - Cr in normal range and within 360 days    Creatinine, Ser  Date Value Ref Range Status  09/05/2022 1.02 (H) 0.57 - 1.00 mg/dL Final         Passed - Valid encounter within last 12 months    Recent Outpatient Visits           2 months ago SBO (small bowel obstruction) (HCC)   [place] [facility] at [facility], Nyoka Cowden, MD   6 months ago Muscle cramps at night   [facility] at [facility], Nyoka Cowden, MD   7 months ago Primary osteoarthritis of left knee   [place] [facility] at [facility], Ocie Bob, MD   7 months ago Annual physical exam   [facility] Primary Care & Sports Medicine at [facility], Nyoka Cowden, MD   9 months ago Cellulitis  of toe of right foot   [facility] Primary Care & Sports Medicine at [facility], Nyoka Cowden, MD              Passed - CBC within normal limits and completed in the last 12 months    WBC  Date Value Ref Range Status  03/15/2022 8.3 4.0 - 10.5 K/uL Final   RBC  Date Value Ref Range Status  03/15/2022 3.27 (L) 3.87 - 5.11 MIL/uL Final   Hemoglobin  Date Value Ref Range Status  03/15/2022 10.4 (L) 12.0 - 15.0 g/dL Final  09/81/1914 9.7 (L) 11.1 - 15.9 g/dL Final   HCT  Date Value Ref Range Status  03/15/2022 32.0 (L) 36.0 - 46.0 % Final   Hematocrit  Date Value Ref Range Status  07/23/2021 28.6 (L) 34.0 - 46.6 % Final   MCHC  Date Value Ref Range Status  03/15/2022 32.5 30.0 - 36.0 g/dL Final   [facility]  Date Value Ref Range Status  03/15/2022 31.8 26.0 - 34.0 pg Final   MCV  Date Value Ref Range Status  03/15/2022 97.9 80.0 - 100.0 fL Final  07/23/2021 96 79 - 97 fL Final   No results found for: "PLTCOUNTKUC", "LABPLAT", "POCPLA" RDW  Date Value Ref Range Status  03/15/2022 20.6 (H) 11.5 - 15.5 % Final  07/23/2021 20.2 (H) 11.7 - 15.4 % Final

## 2022-11-11 ENCOUNTER — Telehealth: Payer: Self-pay | Admitting: Internal Medicine

## 2022-11-11 NOTE — Telephone Encounter (Signed)
Called and left Tonya a VM informing her patient had bone density scan already.  - Aycen Porreca

## 2022-11-11 NOTE — Telephone Encounter (Signed)
Copied from CRM [id number]. Topic: General - Other >> Nov 11, 2022 11:18 AM Patsy Lager T wrote: Reason for CRM: Tonya RN called to see if there was a Bone Density scan ordered on the faxed form from 3/22. Please refer to Encounter from 4/16 and advise Tonya at [phone].

## 2022-12-02 ENCOUNTER — Encounter: Payer: Self-pay | Admitting: Internal Medicine

## 2022-12-02 ENCOUNTER — Ambulatory Visit (INDEPENDENT_AMBULATORY_CARE_PROVIDER_SITE_OTHER): Payer: Medicare HMO | Admitting: Internal Medicine

## 2022-12-02 ENCOUNTER — Ambulatory Visit (INDEPENDENT_AMBULATORY_CARE_PROVIDER_SITE_OTHER): Payer: Medicare HMO

## 2022-12-02 VITALS — BP 128/72 | HR 75 | Ht 66.0 in | Wt 177.0 lb

## 2022-12-02 DIAGNOSIS — I739 Peripheral vascular disease, unspecified: Secondary | ICD-10-CM

## 2022-12-02 DIAGNOSIS — B354 Tinea corporis: Secondary | ICD-10-CM | POA: Diagnosis not present

## 2022-12-02 DIAGNOSIS — E1169 Type 2 diabetes mellitus with other specified complication: Secondary | ICD-10-CM

## 2022-12-02 DIAGNOSIS — Z0000 Encounter for general adult medical examination without abnormal findings: Secondary | ICD-10-CM

## 2022-12-02 DIAGNOSIS — D485 Neoplasm of uncertain behavior of skin: Secondary | ICD-10-CM | POA: Diagnosis not present

## 2022-12-02 DIAGNOSIS — L409 Psoriasis, unspecified: Secondary | ICD-10-CM | POA: Diagnosis not present

## 2022-12-02 DIAGNOSIS — E785 Hyperlipidemia, unspecified: Secondary | ICD-10-CM

## 2022-12-02 MED ORDER — FLUCONAZOLE 100 MG PO TABS: 100.0000 mg | ORAL_TABLET | Freq: Every day | ORAL | 0 refills | Status: AC

## 2022-12-02 MED ORDER — TRIAMCINOLONE ACETONIDE 0.1 % EX LOTN: 1.0000 | TOPICAL_LOTION | Freq: Three times a day (TID) | CUTANEOUS | 0 refills | Status: AC

## 2022-12-02 NOTE — Assessment & Plan Note (Signed)
On appropriate statin therapy Lab Results  Component Value Date   LDLCALC 55 03/15/2022

## 2022-12-02 NOTE — Progress Notes (Signed)
Subjective:   Kiara Campbell is a 67 y.o. female who presents for Medicare Annual (Subsequent) preventive examination.  I connected with  Kiara Campbell on 12/02/22 by an in person visit nd verified that I am speaking with the correct person using two identifiers.  Patient Location: Other:  In office  Provider Location: Office/Clinic   Review of Systems    Defer to PCP  Cardiac Risk Factors include: advanced age (>71men, >36 women);diabetes mellitus;hypertension;smoking/ tobacco exposure     Objective:    Today's Vitals   12/02/22 1332  PainSc: 4    There is no height or weight on file to calculate BMI.     12/02/2022    1:33 PM 10/13/2021    3:00 PM 08/18/2021   12:06 PM 02/03/2021    8:29 AM 05/22/2019    1:35 PM 08/09/2018    1:41 PM 02/21/2018    2:58 PM  Advanced Directives  Does Patient Have a Medical Advance Directive? No No No No No No No  Would patient like information on creating a medical advance directive? No - Patient declined No - Patient declined No - Patient declined  Yes (MAU/Ambulatory/Procedural Areas - Information given)  Yes (MAU/Ambulatory/Procedural Areas - Information given)    Current Medications (verified) Outpatient Encounter Medications as of 12/02/2022  Medication Sig   Alcohol Swabs (B-D SINGLE USE SWABS REGULAR) PADS USE DAILY.   allopurinol (ZYLOPRIM) 300 MG tablet TAKE 1 TABLET EVERY DAY   azelastine (ASTELIN) 0.1 % nasal spray Place 1 spray into both nostrils 2 (two) times daily. Use in each nostril as directed   Calcium Carb-Ergocalciferol 500-200 MG-UNIT TABS Take 1 tablet by mouth daily.   celecoxib (CELEBREX) 200 MG capsule Take 1 capsule (200 mg total) by mouth 2 (two) times daily.   colchicine (COLCRYS) 0.6 MG tablet Take 1 tablet (0.6 mg total) by mouth 2 (two) times daily.   fexofenadine (ALLEGRA) 60 MG tablet Take 60 mg by mouth 2 (two) times daily.   FIBER PO Take 1 capsule by mouth in the morning and at bedtime.   fluticasone  (FLONASE) 50 MCG/ACT nasal spray Place 1 spray into both nostrils daily.   glucose blood (TRUE METRIX BLOOD GLUCOSE TEST) test strip TEST BLOOD SUGAR ONCE EVERY DAY   HYDROcodone-acetaminophen (NORCO/VICODIN) 5-325 MG tablet Take 1 tablet by mouth every 6 (six) hours as needed.   Lancet Devices (TRUEDRAW LANCING DEVICE) MISC 1 each by Does not apply route 3 (three) times daily as needed.   Lancets Misc. (ACCU-CHEK SOFTCLIX LANCET DEV) KIT 1 each by Does not apply route daily.   losartan (COZAAR) 100 MG tablet TAKE 1 TABLET EVERY DAY   metFORMIN (GLUCOPHAGE-XR) 500 MG 24 hr tablet TAKE 1 TABLET EVERY DAY WITH BREAKFAST   metoprolol succinate (TOPROL-XL) 50 MG 24 hr tablet TAKE 3 TABLETS DAILY WITH OR IMMEDIATELY FOLLOWING A MEAL   mupirocin ointment (BACTROBAN) 2 % PLACE 1 APPLICATION INTO THE NOSE 2 TIMES A DAILY.   omeprazole (PRILOSEC) 20 MG capsule TAKE 1 CAPSULE EVERY DAY   simvastatin (ZOCOR) 40 MG tablet TAKE 1 TABLET AT BEDTIME   traMADol (ULTRAM) 50 MG tablet Take 1 tablet (50 mg total) by mouth every 12 (twelve) hours as needed.   TRUEplus Lancets 30G MISC 1 each by Does not apply route 2 (two) times daily.   vitamin C (ASCORBIC ACID) 500 MG tablet Take 500 mg by mouth daily.   Vitamin D, Ergocalciferol, (DRISDOL) 1.25 MG (50000 UNIT)  CAPS capsule Take 1 capsule (50,000 Units total) by mouth every 7 (seven) days. Take for 8 total doses(weeks)   No facility-administered encounter medications on file as of 12/02/2022.    Allergies (verified) Glipizide   History: Past Medical History:  Diagnosis Date   Calcium blood increased 12/23/2013   Diabetes mellitus without complication (HCC)    Gout    Hyperlipidemia    Hypertension    Osteopenia    Past Surgical History:  Procedure Laterality Date   ABDOMINAL HYSTERECTOMY     partial   ABDOMINAL HYSTERECTOMY     APPENDECTOMY     BREAST CYST ASPIRATION     neg not sure which side   COLONOSCOPY WITH PROPOFOL N/A 02/03/2021    Procedure: COLONOSCOPY WITH PROPOFOL;  Surgeon: Pasty Spillers, MD;  Location: [facility] ENDOSCOPY;  Service: Endoscopy;  Laterality: N/A;   ESOPHAGOGASTRODUODENOSCOPY N/A 02/03/2021   Procedure: ESOPHAGOGASTRODUODENOSCOPY (EGD);  Surgeon: Pasty Spillers, MD;  Location[facility] END[facility]COPY;  Service: Endoscopy;  Laterality: N/A;   JOINT REPLACEMENT     LESION DESTRUCTION N/A 08/18/2021   Procedure: BIOPSY OF ANORECTAL ULCER;  Surgeon: Andria Meuse, MD;  Location: [facility];  Service: General;  Laterality: N/A;   RECTAL PROLAPSE REPAIR     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   TONSILLECTOMY     VARICOSE VEIN SURGERY     Family History  Problem Relation Age of Onset   CAD Father        died age 87   COPD Mother    Diabetes Maternal Grandmother    Breast cancer Neg Hx    Social History   Socioeconomic History   Marital status: Married    Spouse name: Jimmy Terriquez   Number of children: 2   Years of education: 16   Highest education level: Bachelor's degree (e.g., BA, AB, BS)  Occupational History   Occupation: Retired  Tobacco Use   Smoking status: Former    Packs/day: 0.50    Years: 30.00    Additional pack years: 0.00    Total pack years: 15.00    Types: Cigarettes    Quit date: 08/04/2018    Years since quitting: 4.3   Smokeless tobacco: Never   Tobacco comments:    Patient declines Lung Cancer Screening.  Vaping Use   Vaping Use: Never used  Substance and Sexual Activity   Alcohol use: Yes    Comment: rarely   Drug use: Never   Sexual activity: Not Currently    Partners: Male  Other Topics Concern   Not on file  Social History Narrative   Not on file   Social Determinants of Health   Financial Resource Strain: Low Risk  (12/02/2022)   Overall Financial Resource Strain (CARDIA)    Difficulty of Paying Living Expenses: Not hard at all  Food Insecurity: No Food Insecurity (12/02/2022)   Hunger Vital Sign    Worried About Running Out of  Food in the Last Year: Never true    Ran Out of Food in the Last Year: Never true  Transportation Needs: No Transportation Needs (10/13/2021)   PRAPARE - Administrator, Civil Service (Medical): No    Lack of Transportation (Non-Medical): No  Physical Activity: Inactive (12/02/2022)   Exercise Vital Sign    Days of Exercise per Week: 0 days    Minutes of Exercise per Session: 0 min  Stress: No Stress Concern Present (12/02/2022)   [employer] of Occupational  Health - Occupational Stress Questionnaire    Feeling of Stress : Not at all  Social Connections: Unknown (10/13/2021)   Social Connection and Isolation Panel [NHANES]    Frequency of Communication with Friends and Family: More than three times a week    Frequency of Social Gatherings with Friends and Family: More than three times a week    Attends Religious Services: Patient declined    Database administrator or Organizations: Patient declined    Attends Banker Meetings: Patient declined    Marital Status: Married    Tobacco Counseling Patient Declines lung cancer screening.   Clinical Intake:  Pre-visit preparation completed: Yes  Pain : 0-10 Pain Score: 4  Pain Location: Back Pain Descriptors / Indicators: Aching, Constant Pain Onset: More than a month ago Pain Frequency: Constant     BMI - recorded: 28.57 Nutritional Status: BMI 25 -29 Overweight Nutritional Risks: None Diabetes: Yes  How often do you need to have someone help you when you read instructions, pamphlets, or other written materials from your doctor or pharmacy?: 1 - Never  Diabetic? yes  Interpreter Needed?: No  Information entered by :: Margaretha Sheffield, CMA   Activities of Daily Living    12/02/2022    1:33 PM  In your present state of health, do you have any difficulty performing the following activities:  Hearing? 0  Vision? 0  Difficulty concentrating or making decisions? 0  Walking or climbing stairs? 1   Dressing or bathing? 0  Doing errands, shopping? 0  Preparing Food and eating ? N  Using the Toilet? N  In the past six months, have you accidently leaked urine? N  Do you have problems with loss of bowel control? N  Managing your Medications? N  Managing your Finances? N  Housekeeping or managing your Housekeeping? N    Patient Care Team: Reubin Milan, MD as PCP - General (Internal Medicine) [facility] as Consulting Physician Jinny Sanders, MD as Consulting Physician (Orthopedic Surgery) Wille Glaser (Optometry) Jesusita Oka, MD (Dermatology)  Indicate any recent Medical Services you may have received from other than [facility] providers in the past year (date may be approximate).     Assessment:   This is a routine wellness examination for Dola.  Hearing/Vision screen No results found.  Dietary issues and exercise activities discussed: Current Exercise Habits: The patient does not participate in regular exercise at present, Exercise limited by: None identified   Goals Addressed   None   Depression Screen    12/02/2022    1:29 PM 04/11/2022    3:40 PM 03/15/2022   10:47 AM 01/19/2022    3:04 PM 10/13/2021    2:57 PM 10/01/2021   11:08 AM 09/21/2021    3:33 PM  PHQ 2/9 Scores  PHQ - 2 Score 2 2 2 2 2 2 1   PHQ- 9 Score 5 8 10 7 6 7 7     Fall Risk    12/02/2022    1:30 PM 04/11/2022    3:42 PM 03/15/2022   10:47 AM 01/19/2022    3:04 PM 10/13/2021    2:57 PM  Fall Risk   Falls in the past year? 1 1  1 1   Number falls in past yr: 1 1 1 1 1   Injury with Fall? 1 1 0 0 1  Risk for fall due to : History of fall(s) History of fall(s) History of fall(s) History of fall(s) History of fall(s);Impaired balance/gait  Follow  up Falls evaluation completed Falls evaluation completed Falls evaluation completed Falls evaluation completed Falls evaluation completed    FALL RISK PREVENTION PERTAINING TO THE HOME:  Any stairs in or around the home? No  If so, are there  any without handrails? No  Home free of loose throw rugs in walkways, pet beds, electrical cords, etc? Yes  Adequate lighting in your home to reduce risk of falls? Yes   ASSISTIVE DEVICES UTILIZED TO PREVENT FALLS:  Life alert? No  Use of a cane, walker or w/c? Yes   TIMED UP AND GO:  Was the test performed? Yes .  Gait slow and steady with assistive device      10/13/2021    2:59 PM 05/22/2019    1:40 PM 02/21/2018    2:59 PM 02/07/2017   10:35 AM  6CIT Screen  What Year? 0 points 0 points 0 points 0 points  What month? 0 points 0 points 0 points 0 points  What time? 0 points 0 points 0 points 0 points  Count back from 20 0 points 0 points 0 points 0 points  Months in reverse 0 points 0 points 2 points 0 points  Repeat phrase 2 points 0 points 2 points   Total Score 2 points 0 points 4 points     Immunizations Immunization History  Administered Date(s) Administered   Fluad Quad(high Dose 65+) 03/04/2019, 04/07/2020, 03/09/2021, 03/15/2022   Influenza,inj,Quad PF,6+ Mos 03/10/2015, 04/26/2016, 04/25/2017, 04/20/2018   Influenza-Unspecified 04/12/2017   Moderna Covid Bivalent Peds Booster(74mo Thru 22yrs) 07/08/2021   Moderna Sars-Covid-2 Vaccination 08/14/2019, 09/11/2019, 06/03/2020   Pneumococcal Conjugate-13 01/28/2016   Pneumococcal Polysaccharide-23 05/12/1999, 07/05/2008, 11/19/2018   Respiratory Syncytial Virus Vaccine,Recomb Aduvanted(Arexvy) 06/09/2022   Tdap 10/06/2021   Zoster Recombinat (Shingrix) 07/08/2021, 10/06/2021   Zoster, Live 07/05/2012    TDAP status: Up to date  Flu Vaccine status: Up to date  Pneumococcal vaccine status: Up to date  Covid-19 vaccine status: Completed vaccines  Qualifies for Shingles Vaccine? Yes   Zostavax completed Yes   Shingrix Completed?: Yes  Screening Tests Health Maintenance  Topic Date Due   COVID-19 Vaccine (5 - 2023-24 season) 03/04/2022   MAMMOGRAM  04/14/2022   INFLUENZA VACCINE  02/02/2023   HEMOGLOBIN  A1C  03/08/2023   FOOT EXAM  03/16/2023   Diabetic kidney evaluation - eGFR measurement  09/05/2023   Diabetic kidney evaluation - Urine ACR  09/05/2023   OPHTHALMOLOGY EXAM  09/06/2023   Medicare Annual Wellness (AWV)  12/02/2023   Colonoscopy  02/04/2028   DTaP/Tdap/Td (2 - Td or Tdap) 10/07/2031   Pneumonia Vaccine 31+ Years old  Completed   DEXA SCAN  Completed   Zoster Vaccines- Shingrix  Completed   Hepatitis C Screening  Addressed   HPV VACCINES  Aged Out    Health Maintenance  Health Maintenance Due  Topic Date Due   COVID-19 Vaccine (5 - 2023-24 season) 03/04/2022   MAMMOGRAM  04/14/2022    Colorectal cancer screening: Type of screening: Colonoscopy. Completed 02/03/2021. Repeat every 7 years  Mammogram status: Completed 04/14/2021. Repeat every year  Bone Density status: Completed 10/27/2022. Results reflect: Bone density results: OSTEOPENIA. Repeat every 3 years.  Lung Cancer Screening: (Low Dose CT Chest recommended if Age 32-80 years, 30 pack-year currently smoking OR have quit w/in 15years.) does qualify.   Lung Cancer Screening Referral: Patient declines.  Additional Screening:  Hepatitis C Screening: does qualify; Completed 12/16/2013  Vision Screening: Recommended annual ophthalmology exams for early detection  of glaucoma and other disorders of the eye. Is the patient up to date with their annual eye exam?  Yes  Who is the provider or what is the name of the office in which the patient attends annual eye exams? [facility] [place]  Dental Screening: Recommended annual dental exams for proper oral hygiene  Community Resource Referral / Chronic Care Management: CRR required this visit?  No   CCM required this visit?  No      Plan:     I have personally reviewed and noted the following in the patient's chart:   Medical and social history Use of alcohol, tobacco or illicit drugs  Current medications and supplements including  opioid prescriptions. Patient is currently taking opioid prescriptions. Information provided to patient regarding non-opioid alternatives. Patient advised to discuss non-opioid treatment plan with their provider. Functional ability and status Nutritional status Physical activity Advanced directives List of other physicians Hospitalizations, surgeries, and ER visits in previous 12 months Vitals Screenings to include cognitive, depression, and falls Referrals and appointments  In addition, I have reviewed and discussed with patient certain preventive protocols, quality metrics, and best practice recommendations. A written personalized care plan for preventive services as well as general preventive health recommendations were provided to patient.     Mariel Sleet, CMA   12/02/2022   Nurse Notes: None.

## 2022-12-02 NOTE — Progress Notes (Signed)
Date:  12/02/2022   Name:  Kiara Campbell   DOB:  05/12/1956   MRN:  [id number]   Chief Complaint: Difficulty Walking (Patient said it started 2 months ago. No pain, but both feet and legs feel heavy. ) and Rash (Rash behind ears and scalp)  Rash This is a chronic problem. The affected locations include the scalp and torso. Rash characteristics: rash under breasts is red, painful. Pertinent negatives include no fatigue, fever or shortness of breath. (Rash on scalp is dry, itching and scaly.)   Leg heaviness - started several months ago.  Symptoms progress after walking for a minute or two then the legs get very heavy and she has to rest.  There is no pain.  No swelling. She also denies chest pain or change in mild shortness of breath.  Lab Results  Component Value Date   NA 146 (H) 09/05/2022   K 4.5 09/05/2022   CO2 25 09/05/2022   GLUCOSE 117 (H) 09/05/2022   BUN 21 09/05/2022   CREATININE 1.02 (H) 09/05/2022   CALCIUM 9.5 09/05/2022   EGFR 60 09/05/2022   GFRNONAA 52 (L) 03/15/2022   Lab Results  Component Value Date   CHOL 109 03/15/2022   HDL 42 03/15/2022   LDLCALC 55 03/15/2022   TRIG 62 03/15/2022   CHOLHDL 2.6 03/15/2022   Lab Results  Component Value Date   TSH 1.797 03/15/2022   Lab Results  Component Value Date   HGBA1C 6.4 (H) 09/05/2022   Lab Results  Component Value Date   WBC 8.3 03/15/2022   HGB 10.4 (L) 03/15/2022   HCT 32.0 (L) 03/15/2022   MCV 97.9 03/15/2022   PLT 288 03/15/2022   Lab Results  Component Value Date   ALT 25 03/15/2022   AST 23 03/15/2022   ALKPHOS 79 03/15/2022   BILITOT 0.7 03/15/2022   Lab Results  Component Value Date   VD25OH 48.8 09/05/2022     Review of Systems  Constitutional:  Negative for chills, fatigue and fever.  HENT:  Negative for trouble swallowing.   Respiratory:  Negative for chest tightness and shortness of breath.   Cardiovascular:  Negative for chest pain, palpitations and leg swelling.   Musculoskeletal:  Positive for arthralgias, gait problem and myalgias.  Skin:  Positive for rash.  Neurological:  Negative for dizziness, light-headedness and headaches.  Psychiatric/Behavioral:  Negative for dysphoric mood and sleep disturbance. The patient is not nervous/anxious.     Patient Active Problem List   Diagnosis Date Noted   Stage 3a chronic kidney disease (HCC) 09/05/2022   Primary osteoarthritis of left knee 03/22/2022   Displaced fracture of fifth metatarsal bone, left foot, initial encounter for closed fracture 08/11/2021   Anorectal ulcer 07/23/2021   Esophageal dysphagia    Schatzki's ring    Gastric erythema    Hiatal hernia    Hx of skin cancer, basal cell 11/19/2018   Tobacco use disorder, severe, in early remission 08/09/2017   Obesity (BMI 30-39.9) 07/17/2017   GERD (gastroesophageal reflux disease) 07/12/2017   Type II diabetes mellitus with complication (HCC) 06/02/2015   Fibrocystic breast disease 12/03/2014   Colon polyp 12/03/2014   Essential (primary) hypertension 12/03/2014   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   OP (osteoporosis) 12/03/2014   Hyperlipidemia associated with type 2 diabetes mellitus (HCC) 12/03/2014    Allergies  Allergen Reactions   Glipizide Nausea Only    Past Surgical History:  Procedure Laterality Date  ABDOMINAL HYSTERECTOMY     partial   ABDOMINAL HYSTERECTOMY     APPENDECTOMY     BREAST CYST ASPIRATION     neg not sure which side   COLONOSCOPY WITH PROPOFOL N/A 02/03/2021   Procedure: COLONOSCOPY WITH PROPOFOL;  Surgeon: Pasty Spillers, MD;  Location: [facility] ENDOSCOPY;  Service: Endoscopy;  Laterality: N/A;   ESOPHAGOGASTRODUODENOSCOPY N/A 02/03/2021   Procedure: ESOPHAGOGASTRODUODENOSCOPY (EGD);  Surgeon: Pasty Spillers, MD;  Location: [facility] ENDOSCOPY;  Service: Endoscopy;  Laterality: N/A;   JOINT REPLACEMENT     LESION DESTRUCTION N/A 08/18/2021   Procedure: BIOPSY OF ANORECTAL ULCER;   Surgeon: Andria Meuse, MD;  Location: [facility];  Service: General;  Laterality: N/A;   RECTAL PROLAPSE REPAIR     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   TONSILLECTOMY     VARICOSE VEIN SURGERY      Social History   Tobacco Use   Smoking status: Former    Packs/day: 0.50    Years: 30.00    Additional pack years: 0.00    Total pack years: 15.00    Types: Cigarettes    Quit date: 08/04/2018    Years since quitting: 4.3   Smokeless tobacco: Never   Tobacco comments:    Patient declines Lung Cancer Screening.  Vaping Use   Vaping Use: Never used  Substance Use Topics   Alcohol use: Yes    Comment: rarely   Drug use: Never     Medication list has been reviewed and updated.  Current Meds  Medication Sig   Alcohol Swabs (B-D SINGLE USE SWABS REGULAR) PADS USE DAILY.   allopurinol (ZYLOPRIM) 300 MG tablet TAKE 1 TABLET EVERY DAY   azelastine (ASTELIN) 0.1 % nasal spray Place 1 spray into both nostrils 2 (two) times daily. Use in each nostril as directed   Calcium Carb-Ergocalciferol 500-200 MG-UNIT TABS Take 1 tablet by mouth daily.   celecoxib (CELEBREX) 200 MG capsule Take 1 capsule (200 mg total) by mouth 2 (two) times daily.   colchicine (COLCRYS) 0.6 MG tablet Take 1 tablet (0.6 mg total) by mouth 2 (two) times daily.   fexofenadine (ALLEGRA) 60 MG tablet Take 60 mg by mouth 2 (two) times daily.   FIBER PO Take 1 capsule by mouth in the morning and at bedtime.   fluconazole (DIFLUCAN) 100 MG tablet Take 1 tablet (100 mg total) by mouth daily for 3 days.   fluticasone (FLONASE) 50 MCG/ACT nasal spray Place 1 spray into both nostrils daily.   glucose blood (TRUE METRIX BLOOD GLUCOSE TEST) test strip TEST BLOOD SUGAR ONCE EVERY DAY   HYDROcodone-acetaminophen (NORCO/VICODIN) 5-325 MG tablet Take 1 tablet by mouth every 6 (six) hours as needed.   Lancet Devices (TRUEDRAW LANCING DEVICE) MISC 1 each by Does not apply route 3 (three) times daily as  needed.   Lancets Misc. (ACCU-CHEK SOFTCLIX LANCET DEV) KIT 1 each by Does not apply route daily.   losartan (COZAAR) 100 MG tablet TAKE 1 TABLET EVERY DAY   metFORMIN (GLUCOPHAGE-XR) 500 MG 24 hr tablet TAKE 1 TABLET EVERY DAY WITH BREAKFAST   metoprolol succinate (TOPROL-XL) 50 MG 24 hr tablet TAKE 3 TABLETS DAILY WITH OR IMMEDIATELY FOLLOWING A MEAL   mupirocin ointment (BACTROBAN) 2 % PLACE 1 APPLICATION INTO THE NOSE 2 TIMES A DAILY.   omeprazole (PRILOSEC) 20 MG capsule TAKE 1 CAPSULE EVERY DAY   simvastatin (ZOCOR) 40 MG tablet TAKE 1 TABLET AT BEDTIME   traMADol (ULTRAM)  50 MG tablet Take 1 tablet (50 mg total) by mouth every 12 (twelve) hours as needed.   triamcinolone lotion (KENALOG) 0.1 % Apply 1 Application topically 3 (three) times daily. To scalp rash   TRUEplus Lancets 30G MISC 1 each by Does not apply route 2 (two) times daily.   vitamin C (ASCORBIC ACID) 500 MG tablet Take 500 mg by mouth daily.   Vitamin D, Ergocalciferol, (DRISDOL) 1.25 MG (50000 UNIT) CAPS capsule Take 1 capsule (50,000 Units total) by mouth every 7 (seven) days. Take for 8 total doses(weeks)       12/02/2022    1:30 PM 04/11/2022    3:40 PM 03/15/2022   10:47 AM 01/19/2022    3:04 PM  GAD 7 : Generalized Anxiety Score  Nervous, Anxious, on Edge 1 1 1  0  Control/stop worrying 0 0 0 0  Worry too much - different things 0 0 0 0  Trouble relaxing 1 2 2  0  Restless 0 0 2 0  Easily annoyed or irritable 0 2 2 0  Afraid - awful might happen 1 0 0 0  Total GAD 7 Score 3 5 7  0  Anxiety Difficulty Not difficult at all Not difficult at all Somewhat difficult Not difficult at all       12/02/2022    1:29 PM 04/11/2022    3:40 PM 03/15/2022   10:47 AM  Depression screen PHQ 2/9  Decreased Interest 1 1 1   Down, Depressed, Hopeless 1 1 1   PHQ - 2 Score 2 2 2   Altered sleeping 0 2 2  Tired, decreased energy 2 3 3   Change in appetite 0 0 1  Feeling bad or failure about yourself  0 0 1  Trouble concentrating  1 1 0  Moving slowly or fidgety/restless 0 0 1  Suicidal thoughts 0 0 0  PHQ-9 Score 5 8 10   Difficult doing work/chores Not difficult at all Not difficult at all Not difficult at all    BP Readings from Last 3 Encounters:  12/02/22 128/72  09/05/22 132/72  07/25/22 (!) 179/81    Physical Exam Vitals and nursing note reviewed.  Constitutional:      General: She is not in acute distress.    Appearance: Normal appearance. She is well-developed.  HENT:     Head: Normocephalic and atraumatic.  Cardiovascular:     Rate and Rhythm: Normal rate and regular rhythm.     Pulses:          Popliteal pulses are 1+ on the right side and 1+ on the left side.       Dorsalis pedis pulses are 0 on the right side and 1+ on the left side.       Posterior tibial pulses are 1+ on the right side and 1+ on the left side.  Pulmonary:     Effort: Pulmonary effort is normal. No respiratory distress.     Breath sounds: No wheezing or rhonchi.  Musculoskeletal:     Cervical back: Normal range of motion.     Right lower leg: No edema.     Left lower leg: No edema.  Lymphadenopathy:     Cervical: No cervical adenopathy.  Skin:    General: Skin is warm and dry.     Findings: No rash.     Comments: Scaling rash posterior scalp, temples and behind ears.  Red inflamed rash under both breasts  Red lesion middle upper lip  Neurological:  Mental Status: She is alert and oriented to person, place, and time.  Psychiatric:        Mood and Affect: Mood normal.        Behavior: Behavior normal.     Wt Readings from Last 3 Encounters:  12/02/22 177 lb (80.3 kg)  09/05/22 179 lb 6.4 oz (81.4 kg)  04/11/22 179 lb (81.2 kg)    BP 128/72   Pulse 75   Ht 5\' 6"  (1.676 m)   Wt 177 lb (80.3 kg)   LMP 09/27/1995   SpO2 96%   BMI 28.57 kg/m   Assessment and Plan:  Problem List Items Addressed This Visit     Hyperlipidemia associated with type 2 diabetes mellitus (HCC) (Chronic)    On appropriate  statin therapy Lab Results  Component Value Date   LDLCALC 55 03/15/2022        Other Visit Diagnoses     Claudication of both lower extremities (HCC)    -  Primary   refer to VS   Relevant Orders   Ambulatory referral to Vascular Surgery   Scalp psoriasis       will start Kenalog lotion see Derm if no improvement   Relevant Medications   triamcinolone lotion (KENALOG) 0.1 %   Neoplasm of uncertain behavior of skin of lip       needs Derm evaluation for suspicious lesion  has hx of skin cancer and seen by Dr. Cheree Ditto in the past   Relevant Orders   Ambulatory referral to Dermatology   Tinea corporis       Relevant Medications   fluconazole (DIFLUCAN) 100 MG tablet       No follow-ups on file.  MAW was completed today by CMA. Partially dictated using Dragon software, any errors are not intentional.  Reubin Milan, MD [facility] [place], [place]

## 2022-12-26 ENCOUNTER — Other Ambulatory Visit (INDEPENDENT_AMBULATORY_CARE_PROVIDER_SITE_OTHER): Payer: Self-pay | Admitting: Nurse Practitioner

## 2022-12-26 DIAGNOSIS — I739 Peripheral vascular disease, unspecified: Secondary | ICD-10-CM

## 2023-01-02 ENCOUNTER — Ambulatory Visit (INDEPENDENT_AMBULATORY_CARE_PROVIDER_SITE_OTHER): Payer: Self-pay

## 2023-01-02 ENCOUNTER — Encounter (INDEPENDENT_AMBULATORY_CARE_PROVIDER_SITE_OTHER): Payer: Self-pay | Admitting: Nurse Practitioner

## 2023-01-02 ENCOUNTER — Other Ambulatory Visit (INDEPENDENT_AMBULATORY_CARE_PROVIDER_SITE_OTHER): Payer: Self-pay | Admitting: Nurse Practitioner

## 2023-01-02 ENCOUNTER — Ambulatory Visit (INDEPENDENT_AMBULATORY_CARE_PROVIDER_SITE_OTHER): Payer: Medicare HMO | Admitting: Nurse Practitioner

## 2023-01-02 ENCOUNTER — Other Ambulatory Visit (INDEPENDENT_AMBULATORY_CARE_PROVIDER_SITE_OTHER): Payer: Medicare HMO

## 2023-01-02 VITALS — BP 182/84 | HR 60 | Resp 18 | Ht 66.0 in | Wt 180.2 lb

## 2023-01-02 DIAGNOSIS — I10 Essential (primary) hypertension: Secondary | ICD-10-CM

## 2023-01-02 DIAGNOSIS — E118 Type 2 diabetes mellitus with unspecified complications: Secondary | ICD-10-CM | POA: Diagnosis not present

## 2023-01-02 DIAGNOSIS — I700 Atherosclerosis of aorta: Secondary | ICD-10-CM

## 2023-01-02 DIAGNOSIS — E1169 Type 2 diabetes mellitus with other specified complication: Secondary | ICD-10-CM | POA: Diagnosis not present

## 2023-01-02 DIAGNOSIS — R29898 Other symptoms and signs involving the musculoskeletal system: Secondary | ICD-10-CM | POA: Diagnosis not present

## 2023-01-02 DIAGNOSIS — E785 Hyperlipidemia, unspecified: Secondary | ICD-10-CM

## 2023-01-02 DIAGNOSIS — I739 Peripheral vascular disease, unspecified: Secondary | ICD-10-CM

## 2023-01-03 ENCOUNTER — Encounter (INDEPENDENT_AMBULATORY_CARE_PROVIDER_SITE_OTHER): Payer: Self-pay | Admitting: Nurse Practitioner

## 2023-01-03 NOTE — Progress Notes (Signed)
Subjective:    Patient ID: Kiara Campbell, female    DOB: 1955/09/24, 67 y.o.   MRN: [id number] Chief Complaint  Patient presents with   New Patient (Initial Visit)    np. ABI + consult. BLE claudication. referred by berglund, laura.    Kiara Campbell is a 67 year old female that presents today as a referral from Dr. Judithann Graves regarding leg weakness and concern for peripheral arterial disease.  She notes that she feels okay when she first wakes up however when she begins to get up or move her legs feel heavy and it feels like she cannot pick up her legs especially her right.  She notes that this has been getting much worse over the last few months.  She does have extensive lower back issues.  She was told about 20 years ago that she needed surgery but she does not want undergo surgery at that time.  This pain that she is having currently is debilitating to her and makes day-to-day activities very difficult.  She currently denies any rest pain like symptoms or open wounds or ulcerations.  Today the patient has an ABI that is noncompressible on the right and an ABI of 1.09 on the left.  She has normal TBI's bilaterally with strong triphasic waveforms in her bilateral arteries and normal toe waveforms bilaterally.  Additionally, previous CT scan noted aortic atherosclerosis and so we evaluate her aorta iliac vessels to determine if there was a significant level of stenosis there.  Today the patient had no significant stenosis noted at the aortic iliac arterial level.  She also has no evidence of an abdominal aortic aneurysm.    Review of Systems  Cardiovascular:  Positive for leg swelling.  All other systems reviewed and are negative.      Objective:   Physical Exam Vitals reviewed.  HENT:     Head: Normocephalic.  Cardiovascular:     Rate and Rhythm: Normal rate.     Pulses: Normal pulses.  Pulmonary:     Effort: Pulmonary effort is normal.  Musculoskeletal:     Right hip: Decreased  strength.  Skin:    General: Skin is warm and dry.  Neurological:     Mental Status: She is alert and oriented to person, place, and time.  Psychiatric:        Mood and Affect: Mood normal.        Behavior: Behavior normal.        Thought Content: Thought content normal.        Judgment: Judgment normal.     BP (!) 182/84 (BP Location: Left Wrist)   Pulse 60   Resp 18   Ht 5\' 6"  (1.676 m)   Wt 180 lb 3.2 oz (81.7 kg)   LMP 09/27/1995   BMI 29.09 kg/m   Past Medical History:  Diagnosis Date   Calcium blood increased 12/23/2013   Diabetes mellitus without complication (HCC)    Gout    Hyperlipidemia    Hypertension    Osteopenia     Social History   Socioeconomic History   Marital status: Married    Spouse name: Jimmy Cordoba   Number of children: 2   Years of education: 16   Highest education level: Bachelor's degree (e.g., BA, AB, BS)  Occupational History   Occupation: Retired  Tobacco Use   Smoking status: Former    Packs/day: 0.50    Years: 30.00    Additional pack years: 0.00    Total  pack years: 15.00    Types: Cigarettes    Quit date: 08/04/2018    Years since quitting: 4.4   Smokeless tobacco: Never   Tobacco comments:    Patient declines Lung Cancer Screening.  Vaping Use   Vaping Use: Never used  Substance and Sexual Activity   Alcohol use: Yes    Comment: rarely   Drug use: Never   Sexual activity: Not Currently    Partners: Male  Other Topics Concern   Not on file  Social History Narrative   Not on file   Social Determinants of Health   Financial Resource Strain: Low Risk  (12/02/2022)   Overall Financial Resource Strain (CARDIA)    Difficulty of Paying Living Expenses: Not hard at all  Food Insecurity: No Food Insecurity (12/02/2022)   Hunger Vital Sign    Worried About Running Out of Food in the Last Year: Never true    Ran Out of Food in the Last Year: Never true  Transportation Needs: No Transportation Needs (10/13/2021)   PRAPARE  - Administrator, Civil Service (Medical): No    Lack of Transportation (Non-Medical): No  Physical Activity: Inactive (12/02/2022)   Exercise Vital Sign    Days of Exercise per Week: 0 days    Minutes of Exercise per Session: 0 min  Stress: No Stress Concern Present (12/02/2022)   [employer] of Occupational Health - Occupational Stress Questionnaire    Feeling of Stress : Not at all  Social Connections: Unknown (10/13/2021)   Social Connection and Isolation Panel [NHANES]    Frequency of Communication with Friends and Family: More than three times a week    Frequency of Social Gatherings with Friends and Family: More than three times a week    Attends Religious Services: Patient declined    Database administrator or Organizations: Patient declined    Attends Banker Meetings: Patient declined    Marital Status: Married  Catering manager Violence: Not At Risk (12/02/2022)   Humiliation, Afraid, Rape, and Kick questionnaire    Fear of Current or Ex-Partner: No    Emotionally Abused: No    Physically Abused: No    Sexually Abused: No    Past Surgical History:  Procedure Laterality Date   ABDOMINAL HYSTERECTOMY     partial   ABDOMINAL HYSTERECTOMY     APPENDECTOMY     BREAST CYST ASPIRATION     neg not sure which side   COLONOSCOPY WITH PROPOFOL N/A 02/03/2021   Procedure: COLONOSCOPY WITH PROPOFOL;  Surgeon: Pasty Spillers, MD;  Location: [facility] ENDOSCOPY;  Service: Endoscopy;  Laterality: N/A;   ESOPHAGOGASTRODUODENOSCOPY N/A 02/03/2021   Procedure: ESOPHAGOGASTRODUODENOSCOPY (EGD);  Surgeon: Pasty Spillers, MD;  Location: [facility] ENDOSCOPY;  Service: Endoscopy;  Laterality: N/A;   JOINT REPLACEMENT     LESION DESTRUCTION N/A 08/18/2021   Procedure: BIOPSY OF ANORECTAL ULCER;  Surgeon: Andria Meuse, MD;  Location: [facility];  Service: General;  Laterality: N/A;   RECTAL PROLAPSE REPAIR     REPLACEMENT TOTAL KNEE Right  07/25/2017   TONSILLECTOMY     VARICOSE VEIN SURGERY      Family History  Problem Relation Age of Onset   CAD Father        died age 35   COPD Mother    Diabetes Maternal Grandmother    Breast cancer Neg Hx     Allergies  Allergen Reactions   Glipizide Nausea Only  Latest Ref Rng & Units 03/15/2022   12:12 PM 07/23/2021    3:16 PM 03/09/2021    3:45 PM  CBC  WBC 4.0 - 10.5 K/uL 8.3  11.6  8.6   Hemoglobin 12.0 - 15.0 g/dL 44.0  9.7  9.2   Hematocrit 36.0 - 46.0 % 32.0  28.6  27.6   Platelets 150 - 400 K/uL 288  323  332       CMP     Component Value Date/Time   NA 146 (H) 09/05/2022 1416   K 4.5 09/05/2022 1416   CL 107 (H) 09/05/2022 1416   CO2 25 09/05/2022 1416   GLUCOSE 117 (H) 09/05/2022 1416   GLUCOSE 117 (H) 03/15/2022 1212   BUN 21 09/05/2022 1416   CREATININE 1.02 (H) 09/05/2022 1416   CALCIUM 9.5 09/05/2022 1416   PROT 8.1 03/15/2022 1212   PROT 7.5 07/23/2021 1516   ALBUMIN 4.4 03/15/2022 1212   ALBUMIN 4.9 (H) 07/23/2021 1516   AST 23 03/15/2022 1212   ALT 25 03/15/2022 1212   ALKPHOS 79 03/15/2022 1212   BILITOT 0.7 03/15/2022 1212   BILITOT 0.3 07/23/2021 1516   EGFR 60 09/05/2022 1416   GFRNONAA 52 (L) 03/15/2022 1212     No results found.     Assessment & Plan:   1. Weakness of both lower extremities Recommend:  The patient has atypical pain symptoms for vascular disease and on exam I do not find evidence of vascular pathology that would explain the patient's symptoms.  Noninvasive studies do not identify significant vascular problems  I suspect the patient is c/o pseudoclaudication.  Patient should have an evaluation of the LS spine which I defer to the primary service or the Spine service.  The patient should continue walking and begin a more formal exercise program. The patient should continue his antiplatelet therapy and aggressive treatment of the lipid abnormalities.  Patient will follow-up with me on a PRN basis. -  Ambulatory referral to Neurosurgery  2. Hyperlipidemia associated with type 2 diabetes mellitus (HCC) Continue statin as ordered and reviewed, no changes at this time  3. Essential (primary) hypertension Continue antihypertensive medications as already ordered, these medications have been reviewed and there are no changes at this time.  4. Type II diabetes mellitus with complication (HCC) Continue hypoglycemic medications as already ordered, these medications have been reviewed and there are no changes at this time.  Hgb A1C to be monitored as already arranged by primary service   Current Outpatient Medications on File Prior to Visit  Medication Sig Dispense Refill   Alcohol Swabs (B-D SINGLE USE SWABS REGULAR) PADS USE DAILY. 100 each 0   allopurinol (ZYLOPRIM) 300 MG tablet TAKE 1 TABLET EVERY DAY 90 tablet 0   azelastine (ASTELIN) 0.1 % nasal spray Place 1 spray into both nostrils 2 (two) times daily. Use in each nostril as directed 90 mL 1   Calcium Carb-Ergocalciferol 500-200 MG-UNIT TABS Take 1 tablet by mouth daily.     celecoxib (CELEBREX) 200 MG capsule Take 1 capsule (200 mg total) by mouth 2 (two) times daily. 180 capsule 0   colchicine (COLCRYS) 0.6 MG tablet Take 1 tablet (0.6 mg total) by mouth 2 (two) times daily. 30 tablet 0   fexofenadine (ALLEGRA) 60 MG tablet Take 60 mg by mouth 2 (two) times daily.     FIBER PO Take 1 capsule by mouth in the morning and at bedtime.     fluticasone (FLONASE) 50 MCG/ACT  nasal spray Place 1 spray into both nostrils daily.     glucose blood (TRUE METRIX BLOOD GLUCOSE TEST) test strip TEST BLOOD SUGAR ONCE EVERY DAY 100 strip 3   HYDROcodone-acetaminophen (NORCO/VICODIN) 5-325 MG tablet Take 1 tablet by mouth every 6 (six) hours as needed. 16 tablet 0   Lancet Devices (TRUEDRAW LANCING DEVICE) MISC 1 each by Does not apply route 3 (three) times daily as needed. 1 each 3   Lancets Misc. (ACCU-CHEK SOFTCLIX LANCET DEV) KIT 1 each by Does not  apply route daily. 1 kit 0   losartan (COZAAR) 100 MG tablet TAKE 1 TABLET EVERY DAY 90 tablet 3   metFORMIN (GLUCOPHAGE-XR) 500 MG 24 hr tablet TAKE 1 TABLET EVERY DAY WITH BREAKFAST 90 tablet 3   metoprolol succinate (TOPROL-XL) 50 MG 24 hr tablet TAKE 3 TABLETS DAILY WITH OR IMMEDIATELY FOLLOWING A MEAL 270 tablet 3   mupirocin ointment (BACTROBAN) 2 % PLACE 1 APPLICATION INTO THE NOSE 2 TIMES A DAILY. 30 g 5   omeprazole (PRILOSEC) 20 MG capsule TAKE 1 CAPSULE EVERY DAY 90 capsule 3   simvastatin (ZOCOR) 40 MG tablet TAKE 1 TABLET AT BEDTIME 90 tablet 0   traMADol (ULTRAM) 50 MG tablet Take 1 tablet (50 mg total) by mouth every 12 (twelve) hours as needed. 60 tablet 2   triamcinolone lotion (KENALOG) 0.1 % Apply 1 Application topically 3 (three) times daily. To scalp rash 60 mL 0   TRUEplus Lancets 30G MISC 1 each by Does not apply route 2 (two) times daily. 100 each 3   vitamin C (ASCORBIC ACID) 500 MG tablet Take 500 mg by mouth daily.     Vitamin D, Ergocalciferol, (DRISDOL) 1.25 MG (50000 UNIT) CAPS capsule Take 1 capsule (50,000 Units total) by mouth every 7 (seven) days. Take for 8 total doses(weeks) 8 capsule 0   No current facility-administered medications on file prior to visit.    There are no Patient Instructions on file for this visit. No follow-ups on file.   Georgiana Spinner, NP

## 2023-01-18 NOTE — Progress Notes (Signed)
Referring Physician:  Georgiana Spinner, NP [address] [place],  [place] [address]  Primary Physician:  Reubin Milan, MD  History of Present Illness: 01/23/2023 Kiara Campbell has a history of hyperlipidemia, HTN, DM, GERD, CKD, osteoporosis, and obesity.   Referral from vascular for leg weakness. Was told she needed spine surgery years ago.   History of chronic LBP that has been worse in last 6 weeks with constant LBP with lateral leg pain to her feet. Legs feel heavy- right is worse. She is having frequent falls- feels like she trips on her foot. She has numbness, tingling, and weakness in both legs. Pain is much worse with walking and standing. Grocery cart helps.   She has 6-8 week history of urinary leakage, no incontinence. She is pelvic floor PT. History of anal prolapse and had surgery years ago- she has chronic bowel leakage. No perineal numbness.   Conservative measures:  Physical therapy:  did PT 2 years ago with no relief Multimodal medical therapy including regular antiinflammatories: celebrex, norco, ultram  Injections: had epidural steroid injections over 20 years ago at [facility]  Past Surgery: no spinal surgery  Kiara Campbell has symptoms of cervical myelopathy. She has issues with hand dexterity and worsening balance.   The symptoms are causing a significant impact on the patient's life.   Review of Systems:  A 10 point review of systems is negative, except for the pertinent positives and negatives detailed in the HPI.  Past Medical History: Past Medical History:  Diagnosis Date   Calcium blood increased 12/23/2013   Diabetes mellitus without complication (HCC)    Gout    Hyperlipidemia    Hypertension    Osteopenia     Past Surgical History: Past Surgical History:  Procedure Laterality Date   ABDOMINAL HYSTERECTOMY     partial   ABDOMINAL HYSTERECTOMY     APPENDECTOMY     BREAST CYST ASPIRATION     neg not sure which side    COLONOSCOPY WITH PROPOFOL N/A 02/03/2021   Procedure: COLONOSCOPY WITH PROPOFOL;  Surgeon: Pasty Spillers, MD;  Location: [facility] ENDOSCOPY;  Service: Endoscopy;  Laterality: N/A;   ESOPHAGOGASTRODUODENOSCOPY N/A 02/03/2021   Procedure: ESOPHAGOGASTRODUODENOSCOPY (EGD);  Surgeon: Pasty Spillers, MD;  Location: [facility] ENDOSCOPY;  Service: Endoscopy;  Laterality: N/A;   JOINT REPLACEMENT     LESION DESTRUCTION N/A 08/18/2021   Procedure: BIOPSY OF ANORECTAL ULCER;  Surgeon: Andria Meuse, MD;  Location: [facility];  Service: General;  Laterality: N/A;   RECTAL PROLAPSE REPAIR     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   TONSILLECTOMY     VARICOSE VEIN SURGERY      Allergies: Allergies as of 01/23/2023 - Review Complete 01/23/2023  Allergen Reaction Noted   Glipizide Nausea Only 09/28/2015    Medications: Outpatient Encounter Medications as of 01/23/2023  Medication Sig   Alcohol Swabs (B-D SINGLE USE SWABS REGULAR) PADS USE DAILY.   allopurinol (ZYLOPRIM) 300 MG tablet TAKE 1 TABLET EVERY DAY   azelastine (ASTELIN) 0.1 % nasal spray Place 1 spray into both nostrils 2 (two) times daily. Use in each nostril as directed   Calcium Carb-Ergocalciferol 500-200 MG-UNIT TABS Take 1 tablet by mouth daily.   celecoxib (CELEBREX) 200 MG capsule Take 1 capsule (200 mg total) by mouth 2 (two) times daily.   colchicine (COLCRYS) 0.6 MG tablet Take 1 tablet (0.6 mg total) by mouth 2 (two) times daily.  fexofenadine (ALLEGRA) 60 MG tablet Take 60 mg by mouth 2 (two) times daily.   FIBER PO Take 1 capsule by mouth in the morning and at bedtime.   fluticasone (FLONASE) 50 MCG/ACT nasal spray Place 1 spray into both nostrils daily.   glucose blood (TRUE METRIX BLOOD GLUCOSE TEST) test strip TEST BLOOD SUGAR ONCE EVERY DAY   HYDROcodone-acetaminophen (NORCO/VICODIN) 5-325 MG tablet Take 1 tablet by mouth every 6 (six) hours as needed.   Lancet Devices (TRUEDRAW LANCING DEVICE)  MISC 1 each by Does not apply route 3 (three) times daily as needed.   Lancets Misc. (ACCU-CHEK SOFTCLIX LANCET DEV) KIT 1 each by Does not apply route daily.   losartan (COZAAR) 100 MG tablet TAKE 1 TABLET EVERY DAY   metFORMIN (GLUCOPHAGE-XR) 500 MG 24 hr tablet TAKE 1 TABLET EVERY DAY WITH BREAKFAST   metoprolol succinate (TOPROL-XL) 50 MG 24 hr tablet TAKE 3 TABLETS DAILY WITH OR IMMEDIATELY FOLLOWING A MEAL   mupirocin ointment (BACTROBAN) 2 % PLACE 1 APPLICATION INTO THE NOSE 2 TIMES A DAILY.   omeprazole (PRILOSEC) 20 MG capsule TAKE 1 CAPSULE EVERY DAY   simvastatin (ZOCOR) 40 MG tablet TAKE 1 TABLET AT BEDTIME   traMADol (ULTRAM) 50 MG tablet Take 1 tablet (50 mg total) by mouth every 12 (twelve) hours as needed.   triamcinolone lotion (KENALOG) 0.1 % Apply 1 Application topically 3 (three) times daily. To scalp rash   TRUEplus Lancets 30G MISC 1 each by Does not apply route 2 (two) times daily.   vitamin C (ASCORBIC ACID) 500 MG tablet Take 500 mg by mouth daily.   Vitamin D, Ergocalciferol, (DRISDOL) 1.25 MG (50000 UNIT) CAPS capsule Take 1 capsule (50,000 Units total) by mouth every 7 (seven) days. Take for 8 total doses(weeks)   No facility-administered encounter medications on file as of 01/23/2023.    Social History: Social History   Tobacco Use   Smoking status: Former    Current packs/day: 0.00    Average packs/day: 0.5 packs/day for 30.0 years (15.0 ttl pk-yrs)    Types: Cigarettes    Start date: 08/04/1988    Quit date: 08/04/2018    Years since quitting: 4.4   Smokeless tobacco: Never   Tobacco comments:    Patient declines Lung Cancer Screening.  Vaping Use   Vaping status: Never Used  Substance Use Topics   Alcohol use: Yes    Comment: rarely   Drug use: Never    Family Medical History: Family History  Problem Relation Age of Onset   CAD Father        died age 3   COPD Mother    Diabetes Maternal Grandmother    Breast cancer Neg Hx     Physical  Examination: Vitals:   01/23/23 1106  BP: 116/82    General: Patient is well developed, well nourished, calm, collected, and in no apparent distress. Attention to examination is appropriate.  Respiratory: Patient is breathing without any difficulty.   NEUROLOGICAL:     Awake, alert, oriented to person, place, and time.  Speech is clear and fluent. Fund of knowledge is appropriate.   Cranial Nerves: Pupils equal round and reactive to light.  Facial tone is symmetric.    Diffse posterior lumbar tenderness.   No abnormal lesions on exposed skin.   Strength: Side Biceps Triceps Deltoid Interossei Grip Wrist Ext. Wrist Flex.  R 5 5 5 5 5 5 5   L 5 5 5 5 5 5  5  Side Iliopsoas Quads Hamstring PF DF EHL  R 5 5 5 5 5 5   L 5 5 5 5 5 5    Reflexes are 1+ and symmetric at the biceps, brachioradialis, patella and achilles.   Hoffman's is absent.  Clonus is not present.   Bilateral upper and lower extremity sensation is intact to light touch.     Gait is slow and unsteady. She ambulates with a cane.   Medical Decision Making  Imaging: No lumbar imaging.   Assessment and Plan: Kiara Campbell is a pleasant 67 y.o. female has a history of chronic LBP that has been worse in last 6 weeks with constant LBP with lateral leg pain to her feet. Legs feel heavy- right is worse. She is having frequent falls- feels like she trips on her foot. She has numbness, tingling, and weakness in both legs. Pain is much worse with walking and standing.   She also notes problems with hand dexterity and worsening balance.   No cervical or lumbar imaging done.   Treatment options discussed with patient and following plan made:   - MRI of cervical spine to evaluate for cervical stenosis. She has issues with hand dexterity, balance issues, and frequent falling.  - MRI of lumbar spine to evaluate for spinal stenosis.   - Okay to continue celebrex and ultram from other providers.  - She was given new prescription  for robaxin to help with muscle spasms. Reviewed dosing and side effects. She knows this can make her sleepy.  - She has 6-8 week history of urinary leakage, no incontinence. Also with a history of anal prolapse and had surgery years ago- she has chronic bowel leakage. None of this appears spine mediated.  - Will schedule phone visit to review MRI results once I get them back.   I spent a total of 30 minutes in face-to-face and non-face-to-face activities related to this patient's care today including review of outside records, review of imaging, review of symptoms, physical exam, discussion of differential diagnosis, discussion of treatment options, and documentation.   Thank you for involving me in the care of this patient.   Drake Leach PA-C Dept. of Neurosurgery

## 2023-01-19 ENCOUNTER — Ambulatory Visit: Payer: Medicare HMO | Admitting: Orthopedic Surgery

## 2023-01-23 ENCOUNTER — Ambulatory Visit: Payer: Medicare HMO | Admitting: Orthopedic Surgery

## 2023-01-23 ENCOUNTER — Encounter: Payer: Self-pay | Admitting: Orthopedic Surgery

## 2023-01-23 VITALS — BP 116/82 | Wt 179.0 lb

## 2023-01-23 DIAGNOSIS — R2689 Other abnormalities of gait and mobility: Secondary | ICD-10-CM | POA: Diagnosis not present

## 2023-01-23 DIAGNOSIS — M5442 Lumbago with sciatica, left side: Secondary | ICD-10-CM

## 2023-01-23 DIAGNOSIS — M5441 Lumbago with sciatica, right side: Secondary | ICD-10-CM | POA: Diagnosis not present

## 2023-01-23 DIAGNOSIS — M5416 Radiculopathy, lumbar region: Secondary | ICD-10-CM | POA: Diagnosis not present

## 2023-01-23 MED ORDER — METHOCARBAMOL 500 MG PO TABS
500.0000 mg | ORAL_TABLET | Freq: Every evening | ORAL | 0 refills | Status: AC | PRN
Start: 2023-01-23 — End: ?

## 2023-01-23 NOTE — Patient Instructions (Signed)
It was so nice to see you today. Thank you so much for coming in.    The symptoms in your back and legs are likely from your lower back. The balance issues and dropping things may be from your neck.  I want to get an MRI of your neck and lower back to look into things further. We will get this approved through your insurance and [facility] will call you to schedule the appointment.   Once you have the MRIs done, it takes 5-7 days for me to get the results.   I also sent a prescription for methocarbamol to help with muscle spasms. Use only as needed and be careful, this can make you sleepy.   Once I have the results back of the MRIs, we will call you to schedule a phone visit to review them.   Please do not hesitate to call if you have any questions or concerns. You can also message me in MyChart.   If you have not heard back about any of the MRIs in the next week, please call the office so we can help you get them scheduled.   Drake Leach PA-C [phone]

## 2023-01-28 ENCOUNTER — Ambulatory Visit
Admission: RE | Admit: 2023-01-28 | Discharge: 2023-01-28 | Disposition: A | Discharge status: Home / Self Care | Payer: Medicare HMO | Source: Ambulatory Visit | Attending: Orthopedic Surgery | Admitting: Orthopedic Surgery

## 2023-01-28 DIAGNOSIS — M5442 Lumbago with sciatica, left side: Secondary | ICD-10-CM | POA: Insufficient documentation

## 2023-01-28 DIAGNOSIS — R29898 Other symptoms and signs involving the musculoskeletal system: Secondary | ICD-10-CM | POA: Diagnosis not present

## 2023-01-28 DIAGNOSIS — M5416 Radiculopathy, lumbar region: Secondary | ICD-10-CM | POA: Diagnosis not present

## 2023-01-28 DIAGNOSIS — M545 Low back pain, unspecified: Secondary | ICD-10-CM | POA: Diagnosis not present

## 2023-01-28 DIAGNOSIS — M50322 Other cervical disc degeneration at C5-C6 level: Secondary | ICD-10-CM | POA: Diagnosis not present

## 2023-01-28 DIAGNOSIS — G8929 Other chronic pain: Secondary | ICD-10-CM | POA: Diagnosis not present

## 2023-01-28 DIAGNOSIS — M5441 Lumbago with sciatica, right side: Secondary | ICD-10-CM | POA: Diagnosis not present

## 2023-01-28 DIAGNOSIS — R2689 Other abnormalities of gait and mobility: Secondary | ICD-10-CM | POA: Diagnosis not present

## 2023-01-28 DIAGNOSIS — M47816 Spondylosis without myelopathy or radiculopathy, lumbar region: Secondary | ICD-10-CM | POA: Diagnosis not present

## 2023-01-28 DIAGNOSIS — M47817 Spondylosis without myelopathy or radiculopathy, lumbosacral region: Secondary | ICD-10-CM | POA: Diagnosis not present

## 2023-01-28 DIAGNOSIS — M47812 Spondylosis without myelopathy or radiculopathy, cervical region: Secondary | ICD-10-CM | POA: Diagnosis not present

## 2023-01-28 DIAGNOSIS — M4802 Spinal stenosis, cervical region: Secondary | ICD-10-CM | POA: Diagnosis not present

## 2023-02-08 ENCOUNTER — Other Ambulatory Visit: Payer: Self-pay | Admitting: Internal Medicine

## 2023-02-08 DIAGNOSIS — E1169 Type 2 diabetes mellitus with other specified complication: Secondary | ICD-10-CM

## 2023-02-24 NOTE — Progress Notes (Unsigned)
Telephone Visit- Progress Note: Referring Physician:  Reubin Milan, MD [address] [place],  [place] [address]  Primary Physician:  Reubin Milan, MD  This visit was performed via telephone.  Patient location: home Provider location: office  I spent a total of 10 minutes non-face-to-face activities for this visit on the date of this encounter including review of current clinical condition and response to treatment.    Patient has given verbal consent to this telephone visits and we reviewed the limitations of a telephone visit. Patient wishes to proceed.    Chief Complaint:  follow up on MRI results  History of Present Illness: Kiara Campbell is a 67 y.o. female has a history of  hyperlipidemia, HTN, DM, GERD, CKD, osteoporosis, and obesity.    Last seen by me on 01/23/23 for BLP and balance issues. MRI of cervical and lumbar spine ordered. Phone visit to review the results.   She is about the same, but has noticed some new neck and right shoulder pain. No arm pain. She has limited ROM of her shoulder with pain. She still has issues with balance and dexterity. She has frequent falls.   She continues with constant LBP with lateral leg pain to her feet. She has numbness, tingling, and weakness in both legs. Pain is much worse with walking and standing. Grocery cart helps.    She has history of urinary leakage, no incontinence. History of anal prolapse and had surgery years ago- she has chronic bowel leakage. No perineal numbness.   She quit smoking 6 years ago.   Her mom passed away and the services are today.    Conservative measures:  Physical therapy:  did PT 2 years ago with no relief Multimodal medical therapy including regular antiinflammatories: celebrex, norco, ultram  Injections: had epidural steroid injections over 20 years ago at [facility]   Past Surgery: no spinal surgery   The symptoms are causing a significant impact on the patient's life.    Exam: No exam done as this was a telephone encounter.     Imaging: MRI of cervical spine dated 01/28/23:  FINDINGS: Alignment: There is straightening with slight reversal of the normal cervical lordosis. There is trace anterolisthesis of C4 on C5 and C5 on C6.   Vertebrae: There is background T1 hypointensity throughout the bone marrow. There is no focal marrow signal abnormality or marrow edema.   Cord: Normal in signal and morphology.   Posterior Fossa, vertebral arteries, paraspinal tissues: The imaged posterior fossa is unremarkable. The vertebral artery flow voids are normal. The paraspinal soft tissues are unremarkable.   Disc levels:   C2-C3: Mild facet arthropathy without significant spinal canal or neural foraminal stenosis.   C3-C4: There is trace anterolisthesis with moderate left and mild right facet arthropathy but no significant spinal canal or neural foraminal stenosis   C4-C5: There is trace anterolisthesis with mild-to-moderate left and mild right facet arthropathy but no significant spinal canal or neural foraminal stenosis   C5-C6: There is disc desiccation and narrowing, left worse than right uncovertebral ridging, and mild facet arthropathy resulting in moderate to severe left and mild right neural foraminal stenosis and mild spinal canal stenosis. Note that the foramina are suboptimally assessed due to motion artifact at this level.   C6-C7: Mild bilateral facet arthropathy without significant spinal canal or neural foraminal stenosis.   C7-T1: No significant spinal canal or neural foraminal stenosis.   IMPRESSION: 1. Moderate to severe left neural foraminal  stenosis and mild spinal canal stenosis at C5-C6 due to uncovertebral ridging and facet arthropathy, suboptimally evaluated due to motion artifact. 2. Additional multilevel facet arthropathy most notable on the left at C3-C4 and C4-C5 but no other significant spinal canal or  neural foraminal stenosis. 3. Diffuse T1 hypointensity throughout the bone marrow is nonspecific but can be seen in the setting of obesity, anemia, smoking, or possibly infiltrative marrow process. Correlate with history and CBC.     Electronically Signed   By: Lesia Hausen M.D.   On: 02/08/2023 08:48   MRI of lumbar spine dated 01/28/23:  FINDINGS: Segmentation: Standard; the lowest formed disc space is designated L5-S1.   Alignment: There is a minimal S shaped scoliotic curvature. There is no antero or retrolisthesis.   Vertebrae: Vertebral body heights are preserved, without evidence of acute injury. There is background T1 hypointensity throughout the bone marrow. There is degenerative endplate signal abnormality with mild edema along the right aspect of the L4-L5 disc space as well as mild degenerative/reactive marrow edema in the bilateral L4 and right L5 pedicles. There is no other marrow edema.   Conus medullaris and cauda equina: Conus extends to the T12-L1 level. Conus and cauda equina appear normal.   Paraspinal and other soft tissues: Unremarkable.   Disc levels:   There is moderate to advanced disc degeneration at L3-L4 and L4-L5 and overall mild disc degeneration elsewhere.   T12-L1: There is a minimal disc protrusion without significant spinal canal or neural foraminal stenosis   L1-L2: No significant spinal canal or neural foraminal stenosis   L2-L3: There is a mild disc bulge eccentric to the right and mild facet arthropathy without significant spinal canal or neural foraminal stenosis   L3-L4: There is a diffuse disc bulge, left worse than right endplate spurring, and mild bilateral facet arthropathy resulting in mild bilateral subarticular zone narrowing without evidence of nerve root impingement, and mild bilateral neural foraminal stenosis   L4-L5: There is a diffuse disc bulge eccentric to the right, bilateral endplate spurring, and moderate  right and mild left facet arthropathy resulting in mild spinal canal stenosis with right worse than left subarticular zone narrowing and possible impingement of the traversing right L5 nerve root, and moderate right and mild left neural foraminal stenosis   L5-S1: There is a diffuse disc bulge and moderate bilateral facet arthropathy resulting in mild-to-moderate left worse than right neural foraminal stenosis without significant spinal canal stenosis.   IMPRESSION: 1. Disc bulge eccentric to the right and moderate right facet arthropathy at L4-L5 resulting in mild spinal canal stenosis with right worse than left subarticular zone narrowing and possible impingement of the traversing right L5 nerve root, and moderate right and mild left neural foraminal stenosis. 2. Disc bulge and moderate bilateral facet arthropathy at L5-S1 resulting in mild-to-moderate left worse than right neural foraminal stenosis. 3. Mild degenerative/reactive marrow edema in the bilateral L4 and right L5 posterior elements which could reflect a source of pain. 4. Diffuse T1 hypointensity throughout the bone marrow is nonspecific and can be seen in the setting of obesity, smoking, anemia, or possibly infiltrative marrow process. Correlate with history and CBC.     Electronically Signed   By: Lesia Hausen M.D.   On: 02/08/2023 08:35        I have personally reviewed the images and agree with the above interpretation.  Assessment and Plan: Ms. Pittillo is a pleasant 67 y.o. female that has noticed some new neck and  right shoulder pain. No arm pain. She has limited ROM of her shoulder with pain. She still has issues with balance and dexterity. She has frequent falls.   Cervical MRI shows slight slip C4-C5 and C5-C6 with multilevel facet arthropathy. She has mild central stenosis C5-C6 with mild right and moderate/severe left foraminal stenosis.   Pain in right shoulder is worse with moving arm per patient (no  exam done as it was a phone visit). This sounds more shoulder mediated. I don't see anything that would explain her balance or dexterity issues.   She continues with constant LBP with lateral leg pain to her feet. She has numbness, tingling, and weakness in both legs. Pain is much worse with walking and standing. Grocery cart helps.   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 She also has multilevel lumbar spondylosis.   LBP likely is multifactorial, leg pain likely from L4-L5.    Treatment options discussed with patient and following plan made:   - Recommend PT for cervical and lumbar spine. She declines.  - Okay to continue celebrex and ultram from other providers. Continue prn robaxin.  - Referral to PMR at [facility] to discuss possible lumbar injections. She requests to be seen in [place].  - Referral to [facility] in [place] for evaluation of right shoulder pain.  - Referral to neurology at [facility] in [facility] for evaluation of balance and dexterity issues.  - She has 6-8 week history of urinary leakage, no incontinence. Also with a history of anal prolapse and had surgery years ago- she has chronic bowel leakage. None of this appears spine mediated. She should follow up with PCP for this.  - May consider cervical flexion/extension xrays if pain gets worse.   MRI showed diffuse T1 hypointensity throughout the bone marrow is nonspecific and can be seen in the setting of obesity, smoking, anemia, or possibly infiltrative marrow process. This is diffuse and likely not worrisome. This was reviewed with Dr. Katrinka Blazing and he recommends repeat lumbar MRI in 3-6 months (if no change then nothing more to do).    - Follow up with me in 6-8 weeks for recheck.   Drake Leach PA-C Neurosurgery

## 2023-02-27 ENCOUNTER — Ambulatory Visit (INDEPENDENT_AMBULATORY_CARE_PROVIDER_SITE_OTHER): Payer: Medicare HMO | Admitting: Orthopedic Surgery

## 2023-02-27 ENCOUNTER — Encounter: Payer: Self-pay | Admitting: Orthopedic Surgery

## 2023-02-27 DIAGNOSIS — M4802 Spinal stenosis, cervical region: Secondary | ICD-10-CM

## 2023-02-27 DIAGNOSIS — M4726 Other spondylosis with radiculopathy, lumbar region: Secondary | ICD-10-CM | POA: Diagnosis not present

## 2023-02-27 DIAGNOSIS — M5186 Other intervertebral disc disorders, lumbar region: Secondary | ICD-10-CM

## 2023-02-27 DIAGNOSIS — M47812 Spondylosis without myelopathy or radiculopathy, cervical region: Secondary | ICD-10-CM

## 2023-02-27 DIAGNOSIS — M25511 Pain in right shoulder: Secondary | ICD-10-CM | POA: Diagnosis not present

## 2023-02-27 DIAGNOSIS — M48061 Spinal stenosis, lumbar region without neurogenic claudication: Secondary | ICD-10-CM

## 2023-02-27 DIAGNOSIS — R2689 Other abnormalities of gait and mobility: Secondary | ICD-10-CM

## 2023-02-27 DIAGNOSIS — M5416 Radiculopathy, lumbar region: Secondary | ICD-10-CM

## 2023-02-27 DIAGNOSIS — M47816 Spondylosis without myelopathy or radiculopathy, lumbar region: Secondary | ICD-10-CM

## 2023-03-13 DIAGNOSIS — M5416 Radiculopathy, lumbar region: Secondary | ICD-10-CM | POA: Diagnosis not present

## 2023-03-13 DIAGNOSIS — R2681 Unsteadiness on feet: Secondary | ICD-10-CM | POA: Diagnosis not present

## 2023-03-13 DIAGNOSIS — G8929 Other chronic pain: Secondary | ICD-10-CM | POA: Diagnosis not present

## 2023-03-13 DIAGNOSIS — M5441 Lumbago with sciatica, right side: Secondary | ICD-10-CM | POA: Diagnosis not present

## 2023-03-13 DIAGNOSIS — M5442 Lumbago with sciatica, left side: Secondary | ICD-10-CM | POA: Diagnosis not present

## 2023-03-24 DIAGNOSIS — M5416 Radiculopathy, lumbar region: Secondary | ICD-10-CM | POA: Diagnosis not present

## 2023-03-24 DIAGNOSIS — E119 Type 2 diabetes mellitus without complications: Secondary | ICD-10-CM | POA: Diagnosis not present

## 2023-03-29 ENCOUNTER — Ambulatory Visit: Payer: Self-pay | Admitting: *Deleted

## 2023-03-29 NOTE — Telephone Encounter (Signed)
Message from [place] F sent at 03/29/2023  1:17 PM EDT  Summary: BP concerns   Pt is calling in because she says on Friday her BP was 220/96 before receiving an injection. Pt says she is unsure of the numbers now, but had a pounding feeling in her ears last night and wants to know if she should go to urgent care. Pt has no other symptoms          Call History  Contact Date/Time Type Contact Phone/Fax User  03/29/2023 01:15 PM EDT Phone (Incoming) Laver[place], [place] (Self) [phone] Rexene Edison) Colon Flattery M   Reason for Disposition  Systolic BP  >= 180 OR Diastolic >= 110    BP is elevated and having palpitations and shortness of breath at night.  No chest pain.  Answer Assessment - Initial Assessment Questions 1. BLOOD PRESSURE: "What is the blood pressure?" "Did you take at least two measurements 5 minutes apart?"     My BP was 220/96 on Friday before my spinal injection.    My BP has been high 3 other times when I've checked it at home.    I'm having palpitations at night.   I can hear it in my ears.    2. ONSET: "When did you take your blood pressure?"     I check it daily with my BP machine. 3. HOW: "How did you take your blood pressure?" (e.g., automatic home BP monitor, visiting nurse)     Home kit 4. HISTORY: "Do you have a history of high blood pressure?"     Yes 5. MEDICINES: "Are you taking any medicines for blood pressure?" "Have you missed any doses recently?"     Yes  Has not missed any doses 6. OTHER SYMPTOMS: "Do you have any symptoms?" (e.g., blurred vision, chest pain, difficulty breathing, headache, weakness)     Palpitations and shortness of breath at night.   No dizziness or chest pain. 7. PREGNANCY: "Is there any chance you are pregnant?" "When was your last menstrual period?"     N/A due to age  Protocols used: Blood Pressure - High-A-AH

## 2023-03-29 NOTE — Telephone Encounter (Signed)
  Chief Complaint: BP elevated even though taking her BP medications as prescribed. Symptoms: Friday BP 220/96 before they did her spinal injection.  Her next 3 readings at home have been elevated.  She feels like she's having palpitations at night and can hear her heartbeat in her ears at night along with some shortness of breath at night.   Frequency: Since Friday Pertinent Negatives: Patient denies chest pain. Disposition: [] ED /[] Urgent Care (no appt availability in office) / [x] Appointment(In office/virtual)/ []  [place] [facility]/ [] Home Care/ [] Refused Recommended Disposition /[] [place] [facility]/ [x]  Follow-up with PCP Additional Notes: Pt has an appt with Dr. Judithann Graves on 04/10/2023 at 11:00.   She's on the wait list in case of a cancellation.   Sending a message to see if she can be worked in sooner.   She mentioned going on to the urgent care.   Not sure what she decided to do but I let her know I would send a message to see if she could be seen sooner.  Denies having any chest pain.

## 2023-03-30 ENCOUNTER — Encounter: Payer: Self-pay | Admitting: Internal Medicine

## 2023-03-30 ENCOUNTER — Ambulatory Visit (INDEPENDENT_AMBULATORY_CARE_PROVIDER_SITE_OTHER): Payer: Medicare HMO | Admitting: Internal Medicine

## 2023-03-30 VITALS — BP 125/78 | HR 80 | Ht 66.0 in | Wt 180.0 lb

## 2023-03-30 DIAGNOSIS — M5116 Intervertebral disc disorders with radiculopathy, lumbar region: Secondary | ICD-10-CM

## 2023-03-30 DIAGNOSIS — N1831 Chronic kidney disease, stage 3a: Secondary | ICD-10-CM

## 2023-03-30 DIAGNOSIS — Z23 Encounter for immunization: Secondary | ICD-10-CM | POA: Diagnosis not present

## 2023-03-30 DIAGNOSIS — R0602 Shortness of breath: Secondary | ICD-10-CM | POA: Diagnosis not present

## 2023-03-30 DIAGNOSIS — I10 Essential (primary) hypertension: Secondary | ICD-10-CM | POA: Diagnosis not present

## 2023-03-30 DIAGNOSIS — E785 Hyperlipidemia, unspecified: Secondary | ICD-10-CM

## 2023-03-30 DIAGNOSIS — E118 Type 2 diabetes mellitus with unspecified complications: Secondary | ICD-10-CM

## 2023-03-30 DIAGNOSIS — Z7984 Long term (current) use of oral hypoglycemic drugs: Secondary | ICD-10-CM | POA: Diagnosis not present

## 2023-03-30 DIAGNOSIS — E1169 Type 2 diabetes mellitus with other specified complication: Secondary | ICD-10-CM | POA: Diagnosis not present

## 2023-03-30 MED ORDER — ALBUTEROL SULFATE HFA 108 (90 BASE) MCG/ACT IN AERS
2.0000 | INHALATION_SPRAY | Freq: Four times a day (QID) | RESPIRATORY_TRACT | 0 refills | Status: AC | PRN
Start: 2023-03-30 — End: ?

## 2023-03-30 MED ORDER — TRAMADOL HCL 50 MG PO TABS
50.0000 mg | ORAL_TABLET | Freq: Two times a day (BID) | ORAL | 0 refills | Status: DC | PRN
Start: 2023-03-30 — End: 2023-05-01

## 2023-03-30 NOTE — Assessment & Plan Note (Signed)
Blood sugars stable without hypoglycemic symptoms or events. Current regimen is metformin. Changes made last visit are none. She needs a new meter. Lab Results  Component Value Date   HGBA1C 6.4 (H) 09/05/2022

## 2023-03-30 NOTE — Assessment & Plan Note (Signed)
Now seeing pain management again - recent ESI without benefit May be referred to Neurosurgery Can continue tramadol bid PRN

## 2023-03-30 NOTE — Assessment & Plan Note (Signed)
Recurrent mild symptoms relieved by Albuterol MDI Long hx of smoking and last CXR c/w COPD Will try albuterol as needed for now - follow up if symptoms are more persistent.

## 2023-03-30 NOTE — Assessment & Plan Note (Signed)
LDL is  Lab Results  Component Value Date   LDLCALC 55 03/15/2022   Currently being treated with simvastatin with good compliance and no concerns.

## 2023-03-30 NOTE — Patient Instructions (Signed)
Change metoprolol to 2 tabs in the morning and 1 tab in the evening.

## 2023-03-30 NOTE — Assessment & Plan Note (Signed)
Stable GFR.

## 2023-03-30 NOTE — Assessment & Plan Note (Signed)
BP running high at night intermittently. Always controlled at home during the day. Will split the metoprolol dose to 2 in the AM and 1 in PM

## 2023-03-30 NOTE — Progress Notes (Addendum)
Date:  03/30/2023   Name:  Kiara Campbell   DOB:  [birth year]   MRN:  [id number]   Chief Complaint: Hypertension (Head pulse at night, check BP during day at home, is normal, night time is high. ) She is somewhat depressed since her mother passed away last month.  She is at peace but still struggling with the change.  She does not feel that she needs any medication or counseling at this time.  Hypertension This is a chronic problem. The problem is controlled. Associated symptoms include shortness of breath. Pertinent negatives include no chest pain, headaches or palpitations. Past treatments include calcium channel blockers and angiotensin blockers. The current treatment provides significant improvement.  Diabetes She presents for her follow-up diabetic visit. She has type 2 diabetes mellitus. Hypoglycemia symptoms include nervousness/anxiousness. Pertinent negatives for hypoglycemia include no dizziness or headaches. Pertinent negatives for diabetes include no chest pain and no fatigue. Symptoms are stable.  Hyperlipidemia This is a chronic problem. Associated symptoms include shortness of breath. Pertinent negatives include no chest pain. Current antihyperlipidemic treatment includes statins.  Back Pain This is a chronic problem. The problem has been gradually worsening since onset. The pain is present in the lumbar spine. Pertinent negatives include no chest pain, fever or headaches. Treatments tried: being followed by Pain Mgmt - having ESI.    Lab Results  Component Value Date   NA 146 (H) 09/05/2022   K 4.5 09/05/2022   CO2 25 09/05/2022   GLUCOSE 117 (H) 09/05/2022   BUN 21 09/05/2022   CREATININE 1.02 (H) 09/05/2022   CALCIUM 9.5 09/05/2022   EGFR 60 09/05/2022   GFRNONAA 52 (L) 03/15/2022   Lab Results  Component Value Date   CHOL 109 03/15/2022   HDL 42 03/15/2022   LDLCALC 55 03/15/2022   TRIG 62 03/15/2022   CHOLHDL 2.6 03/15/2022   Lab Results  Component Value  Date   TSH 1.797 03/15/2022   Lab Results  Component Value Date   HGBA1C 6.4 (H) 09/05/2022   Lab Results  Component Value Date   WBC 8.3 03/15/2022   HGB 10.4 (L) 03/15/2022   HCT 32.0 (L) 03/15/2022   MCV 97.9 03/15/2022   PLT 288 03/15/2022   Lab Results  Component Value Date   ALT 25 03/15/2022   AST 23 03/15/2022   ALKPHOS 79 03/15/2022   BILITOT 0.7 03/15/2022   Lab Results  Component Value Date   VD25OH 48.8 09/05/2022     Review of Systems  Constitutional:  Negative for chills, fatigue and fever.  HENT:  Negative for trouble swallowing.   Respiratory:  Positive for shortness of breath. Negative for chest tightness and wheezing.   Cardiovascular:  Negative for chest pain and palpitations.  Musculoskeletal:  Positive for back pain and gait problem.  Neurological:  Negative for dizziness and headaches.  Psychiatric/Behavioral:  Positive for dysphoric mood. Negative for sleep disturbance. The patient is nervous/anxious.     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/19/2018   Tobacco use disorder, severe, in  early remission 08/09/2017   Obesity (BMI 30-39.9) 07/17/2017   GERD (gastroesophageal reflux disease) 07/12/2017   Type II diabetes mellitus with complication (HCC) 06/02/2015   Fibrocystic breast disease 12/03/2014   Colon polyp 12/03/2014   Essential (primary) hypertension 12/03/2014   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   OP (osteoporosis) 12/03/2014   Hyperlipidemia associated with type 2 diabetes mellitus (HCC) 12/03/2014    Allergies   Allergen Reactions   Glipizide Nausea Only    Past Surgical History:  Procedure Laterality Date   ABDOMINAL HYSTERECTOMY     partial   ABDOMINAL HYSTERECTOMY     APPENDECTOMY     BREAST CYST ASPIRATION     neg not sure which side   COLONOSCOPY WITH PROPOFOL N/A 02/03/2021   Procedure: COLONOSCOPY WITH PROPOFOL;  Surgeon: Pasty Spillers, MD;  Location: [facility] ENDOSCOPY;  Service: Endoscopy;  Laterality: N/A;   ESOPHAGOGASTRODUODENOSCOPY N/A 02/03/2021   Procedure: ESOPHAGOGASTRODUODENOSCOPY (EGD);  Surgeon: Pasty Spillers, MD;  Location: [facility] ENDOSCOPY;  Service: Endoscopy;  Laterality: N/A;   JOINT REPLACEMENT     LESION DESTRUCTION N/A 08/18/2021   Procedure: BIOPSY OF ANORECTAL ULCER;  Surgeon: Andria Meuse, MD;  Location: [facility];  Service: General;  Laterality: N/A;   RECTAL PROLAPSE REPAIR     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   TONSILLECTOMY     VARICOSE VEIN SURGERY      Social History   Tobacco Use   Smoking status: Former    Current packs/day: 0.00    Average packs/day: 0.5 packs/day for 30.0 years (15.0 ttl pk-yrs)    Types: Cigarettes    Start date: 08/04/1988    Quit date: 08/04/2018    Years since quitting: 4.6   Smokeless tobacco: Never   Tobacco comments:    Patient declines Lung Cancer Screening.  Vaping Use   Vaping status: Never Used  Substance Use Topics   Alcohol use: Yes    Comment: rarely   Drug use: Never     Medication list has been reviewed and updated.  Current Meds  Medication Sig   albuterol (VENTOLIN HFA) 108 (90 Base) MCG/ACT inhaler Inhale 2 puffs into the lungs every 6 (six) hours as needed for wheezing or shortness of breath.   Alcohol Swabs (B-D SINGLE USE SWABS REGULAR) PADS USE DAILY.   allopurinol (ZYLOPRIM) 300 MG tablet TAKE 1 TABLET EVERY DAY   azelastine (ASTELIN) 0.1 % nasal spray Place 1 spray into both nostrils 2 (two) times daily. Use in each nostril as directed   Calcium  Carb-Ergocalciferol 500-200 MG-UNIT TABS Take 1 tablet by mouth daily.   celecoxib (CELEBREX) 200 MG capsule Take 1 capsule (200 mg total) by mouth 2 (two) times daily.   colchicine (COLCRYS) 0.6 MG tablet Take 1 tablet (0.6 mg total) by mouth 2 (two) times daily.   fexofenadine (ALLEGRA) 60 MG tablet Take 60 mg by mouth 2 (two) times daily.   FIBER PO Take 1 capsule by mouth in the morning and at bedtime.   fluticasone (FLONASE) 50 MCG/ACT nasal spray Place 1 spray into both nostrils daily.   glucose blood (TRUE METRIX BLOOD GLUCOSE TEST) test strip TEST BLOOD SUGAR ONCE EVERY DAY   Lancet Devices (TRUEDRAW LANCING DEVICE) MISC 1 each by Does not apply route 3 (three) times daily as needed.   Lancets Misc. (ACCU-CHEK SOFTCLIX LANCET DEV) KIT 1 each by Does not apply route daily.   losartan (COZAAR) 100 MG tablet TAKE 1 TABLET  EVERY DAY   metFORMIN (GLUCOPHAGE-XR) 500 MG 24 hr tablet TAKE 1 TABLET EVERY DAY WITH BREAKFAST   methocarbamol (ROBAXIN) 500 MG tablet Take 1 tablet (500 mg total) by mouth at bedtime as needed for muscle spasms. This can make you sleepy.   metoprolol succinate (TOPROL-XL) 50 MG 24 hr tablet TAKE 3 TABLETS DAILY WITH OR IMMEDIATELY FOLLOWING A MEAL   mupirocin ointment (BACTROBAN) 2 % PLACE 1 APPLICATION INTO THE NOSE 2 TIMES A DAILY.   omeprazole (PRILOSEC) 20 MG capsule TAKE 1 CAPSULE EVERY DAY   simvastatin (ZOCOR) 40 MG tablet TAKE 1 TABLET AT BEDTIME   triamcinolone lotion (KENALOG) 0.1 % Apply 1 Application topically 3 (three) times daily. To scalp rash   TRUEplus Lancets 30G MISC 1 each by Does not apply route 2 (two) times daily.   vitamin C (ASCORBIC ACID) 500 MG tablet Take 500 mg by mouth daily.   Vitamin D, Ergocalciferol, (DRISDOL) 1.25 MG (50000 UNIT) CAPS capsule Take 1 capsule (50,000 Units total) by mouth every 7 (seven) days. Take for 8 total doses(weeks)   [DISCONTINUED] HYDROcodone-acetaminophen (NORCO/VICODIN) 5-325 MG tablet Take 1 tablet by mouth  every 6 (six) hours as needed.   [DISCONTINUED] traMADol (ULTRAM) 50 MG tablet Take 1 tablet (50 mg total) by mouth every 12 (twelve) hours as needed.       03/30/2023   10:17 AM 12/02/2022    1:30 PM 04/11/2022    3:40 PM 03/15/2022   10:47 AM  GAD 7 : Generalized Anxiety Score  Nervous, Anxious, on Edge 1 1 1 1   Control/stop worrying 1 0 0 0  Worry too much - different things 1 0 0 0  Trouble relaxing 1 1 2 2   Restless 1 0 0 2  Easily annoyed or irritable 1 0 2 2  Afraid - awful might happen 1 1 0 0  Total GAD 7 Score 7 3 5 7   Anxiety Difficulty Somewhat difficult Not difficult at all Not difficult at all Somewhat difficult       03/30/2023   10:16 AM 12/02/2022    1:29 PM 04/11/2022    3:40 PM  Depression screen PHQ 2/9  Decreased Interest 1 1 1   Down, Depressed, Hopeless 1 1 1   PHQ - 2 Score 2 2 2   Altered sleeping 3 0 2  Tired, decreased energy 3 2 3   Change in appetite 2 0 0  Feeling bad or failure about yourself  1 0 0  Trouble concentrating 1 1 1   Moving slowly or fidgety/restless 1 0 0  Suicidal thoughts 0 0 0  PHQ-9 Score 13 5 8   Difficult doing work/chores Not difficult at all Not difficult at all Not difficult at all    BP Readings from Last 3 Encounters:  03/30/23 125/78  01/23/23 116/82  01/02/23 (!) 182/84    Physical Exam Vitals and nursing note reviewed.  Constitutional:      General: She is not in acute distress.    Appearance: She is well-developed.  HENT:     Head: Normocephalic and atraumatic.  Neck:     Vascular: No carotid bruit.  Cardiovascular:     Rate and Rhythm: Normal rate and regular rhythm.  Pulmonary:     Effort: Pulmonary effort is normal. No respiratory distress.     Breath sounds: Normal breath sounds. No wheezing or rhonchi.  Musculoskeletal:     Cervical back: Normal range of motion.     Right lower leg: No edema.  Left lower leg: No edema.  Lymphadenopathy:     Cervical: No cervical adenopathy.  Skin:    General:  Skin is warm and dry.     Findings: No rash.  Neurological:     Mental Status: She is alert and oriented to person, place, and time.  Psychiatric:        Mood and Affect: Mood normal.        Behavior: Behavior normal.     Wt Readings from Last 3 Encounters:  03/30/23 180 lb (81.6 kg)  01/23/23 179 lb (81.2 kg)  01/02/23 180 lb 3.2 oz (81.7 kg)    BP 125/78 (BP Location: Left Arm, Cuff Size: Large)   Pulse 80   Ht 5\' 6"  (1.676 m)   Wt 180 lb (81.6 kg)   LMP 09/27/1995   SpO2 98%   BMI 29.05 kg/m   Assessment and Plan:  Problem List Items Addressed This Visit       Unprioritized   Type II diabetes mellitus with complication (HCC) (Chronic)    Blood sugars stable without hypoglycemic symptoms or events. Current regimen is metformin. Changes made last visit are none. She needs a new meter. Lab Results  Component Value Date   HGBA1C 6.4 (H) 09/05/2022         Relevant Orders   Hemoglobin A1c   Stage 3a chronic kidney disease (HCC)    Stable GFR      Relevant Orders   Comprehensive metabolic panel   Shortness of breath    Recurrent mild symptoms relieved by Albuterol MDI Long hx of smoking and last CXR c/w COPD Will try albuterol as needed for now - follow up if symptoms are more persistent.      Relevant Medications   albuterol (VENTOLIN HFA) 108 (90 Base) MCG/ACT inhaler   Lumbar disc herniation with radiculopathy    Now seeing pain management again - recent ESI without benefit May be referred to Neurosurgery Can continue tramadol bid PRN      Relevant Medications   traMADol (ULTRAM) 50 MG tablet   Hyperlipidemia associated with type 2 diabetes mellitus (HCC) (Chronic)    LDL is  Lab Results  Component Value Date   LDLCALC 55 03/15/2022   Currently being treated with simvastatin with good compliance and no concerns.       Relevant Orders   Lipid panel   Essential (primary) hypertension - Primary (Chronic)    BP running high at night  intermittently. Always controlled at home during the day. Will split the metoprolol dose to 2 in the AM and 1 in PM      Other Visit Diagnoses     Need for influenza vaccination       Relevant Orders   Flu Vaccine Trivalent High Dose (Fluad) (Completed)   Long term current use of oral hypoglycemic drug           Return in about 4 months (around 07/30/2023) for DM, HTN.    Reubin Milan, MD [facility] [place]

## 2023-03-31 LAB — HEMOGLOBIN A1C
Est. average glucose Bld gHb Est-mCnc: 134 mg/dL
Hgb A1c MFr Bld: 6.3 % — ABNORMAL HIGH (ref 4.8–5.6)

## 2023-03-31 LAB — COMPREHENSIVE METABOLIC PANEL
ALT: 19 [IU]/L (ref 0–32)
AST: 21 [IU]/L (ref 0–40)
Albumin: 4.6 g/dL (ref 3.9–4.9)
Alkaline Phosphatase: 88 [IU]/L (ref 44–121)
BUN/Creatinine Ratio: 18 (ref 12–28)
BUN: 17 mg/dL (ref 8–27)
Bilirubin Total: 0.5 mg/dL (ref 0.0–1.2)
CO2: 27 mmol/L (ref 20–29)
Calcium: 9.8 mg/dL (ref 8.7–10.3)
Chloride: 103 mmol/L (ref 96–106)
Creatinine, Ser: 0.93 mg/dL (ref 0.57–1.00)
Globulin, Total: 2.8 g/dL (ref 1.5–4.5)
Glucose: 101 mg/dL — ABNORMAL HIGH (ref 70–99)
Potassium: 4.6 mmol/L (ref 3.5–5.2)
Sodium: 143 mmol/L (ref 134–144)
Total Protein: 7.4 g/dL (ref 6.0–8.5)
eGFR: 67 mL/min/{1.73_m2} (ref >59)

## 2023-03-31 LAB — LIPID PANEL
Chol/HDL Ratio: 2.8 {ratio} (ref 0.0–4.4)
Cholesterol, Total: 136 mg/dL (ref 100–199)
HDL: 48 mg/dL (ref >39)
LDL Chol Calc (NIH): 69 mg/dL (ref 0–99)
Triglycerides: 106 mg/dL (ref 0–149)
VLDL Cholesterol Cal: 19 mg/dL (ref 5–40)

## 2023-03-31 LAB — SPECIMEN STATUS REPORT

## 2023-04-01 ENCOUNTER — Other Ambulatory Visit: Payer: Self-pay | Admitting: Internal Medicine

## 2023-04-01 DIAGNOSIS — M170 Bilateral primary osteoarthritis of knee: Secondary | ICD-10-CM

## 2023-04-01 DIAGNOSIS — E1169 Type 2 diabetes mellitus with other specified complication: Secondary | ICD-10-CM

## 2023-04-07 ENCOUNTER — Ambulatory Visit (INDEPENDENT_AMBULATORY_CARE_PROVIDER_SITE_OTHER): Payer: Medicare HMO

## 2023-04-07 DIAGNOSIS — M5442 Lumbago with sciatica, left side: Secondary | ICD-10-CM | POA: Diagnosis not present

## 2023-04-07 DIAGNOSIS — R2681 Unsteadiness on feet: Secondary | ICD-10-CM | POA: Diagnosis not present

## 2023-04-07 DIAGNOSIS — M25511 Pain in right shoulder: Secondary | ICD-10-CM | POA: Diagnosis not present

## 2023-04-07 DIAGNOSIS — G8929 Other chronic pain: Secondary | ICD-10-CM | POA: Diagnosis not present

## 2023-04-07 DIAGNOSIS — M5416 Radiculopathy, lumbar region: Secondary | ICD-10-CM | POA: Diagnosis not present

## 2023-04-07 DIAGNOSIS — M5441 Lumbago with sciatica, right side: Secondary | ICD-10-CM | POA: Diagnosis not present

## 2023-04-07 NOTE — Progress Notes (Signed)
Patient came into the office today requesting to have her BP checked. Documented BP at 148/72 on left upper arm with large cuff. Patient informed I will document this in the chart. She said her other doctors take her BP with automatic cuffs and her BP is much higher than when we get it here.  - Kiara Campbell

## 2023-04-10 ENCOUNTER — Ambulatory Visit: Payer: Medicare HMO | Admitting: Internal Medicine

## 2023-04-15 NOTE — Progress Notes (Deleted)
Referring Physician:  Reubin Milan, MD [address] [place],  [place] [address]  Primary Physician:  Reubin Milan, MD  History of Present Illness: 04/15/2023 Ms. Marua Wojdyla has a history of hyperlipidemia, HTN, DM, GERD, CKD, osteoporosis, and obesity.   Last did phone call with her on 02/27/23.   She has known slight slip C4-C5 and C5-C6 with multilevel facet arthropathy. She has mild central stenosis C5-C6 with mild right and moderate/severe left foraminal stenosis.   She was sent to ortho for her right shoulder.   She also has known  DDD L3-L4 with mild bilateral foraminal stenosis, DDD L4-L5 with mild central stenosis and moderate right/mild left foraminal stenosis, along with mild/moderate left > right foraminal stenosis at L5-S1. She also has multilevel lumbar spondylosis.    LBP likely is multifactorial, leg pain likely from L4-L5.    She was sent to PT for her neck and back.   She was sent to PMR to discuss lumbar injections. She had bilateral L5-S1 TF ESI on 03/24/23 and had 60% improvement.   She was sent to ortho for her right shoulder. She did not see them, but had right shoulder injection by Dr. Mariah Milling on 04/07/23.   She was to see neurology for balance/dexterity issues. She has appointment on 07/18/23.   She was to follow up with PCP regarding urinary/bowel issues.     Get repeat MRI of lumbar spine in 3 months***  Referral from vascular for leg weakness. Was told she needed spine surgery years ago.   History of chronic LBP that has been worse in last 6 weeks with constant LBP with lateral leg pain to her feet. Legs feel heavy- right is worse. She is having frequent falls- feels like she trips on her foot. She has numbness, tingling, and weakness in both legs. Pain is much worse with walking and standing. Grocery cart helps.   She has 6-8 week history of urinary leakage, no incontinence. She is pelvic floor PT. History of anal prolapse and  had surgery years ago- she has chronic bowel leakage. No perineal numbness.   Conservative measures:  Physical therapy:  did PT 2 years ago with no relief Multimodal medical therapy including regular antiinflammatories: celebrex, norco, ultram  Injections:  bilateral L5-S1 TF ESI on 03/24/23 had epidural steroid injections over 20 years ago at [facility]  Past Surgery: no spinal surgery  Mckynzi D Delon has symptoms of cervical myelopathy. She has issues with hand dexterity and worsening balance.   The symptoms are causing a significant impact on the patient's life.   Review of Systems:  A 10 point review of systems is negative, except for the pertinent positives and negatives detailed in the HPI.  Past Medical History: Past Medical History:  Diagnosis Date   Calcium blood increased 12/23/2013   Diabetes mellitus without complication (HCC)    Gout    Hyperlipidemia    Hypertension    Osteopenia     Past Surgical History: Past Surgical History:  Procedure Laterality Date   ABDOMINAL HYSTERECTOMY     partial   ABDOMINAL HYSTERECTOMY     APPENDECTOMY     BREAST CYST ASPIRATION     neg not sure which side   COLONOSCOPY WITH PROPOFOL N/A 02/03/2021   Procedure: COLONOSCOPY WITH PROPOFOL;  Surgeon: Pasty Spillers, MD;  Location: [facility] ENDOSCOPY;  Service: Endoscopy;  Laterality: N/A;   ESOPHAGOGASTRODUODENOSCOPY N/A 02/03/2021   Procedure: ESOPHAGOGASTRODUODENOSCOPY (EGD);  Surgeon: Pasty Spillers,  MD;  Location: [facility] ENDOSCOPY;  Service: Endoscopy;  Laterality: N/A;   JOINT REPLACEMENT     LESION DESTRUCTION N/A 08/18/2021   Procedure: BIOPSY OF ANORECTAL ULCER;  Surgeon: Andria Meuse, MD;  Location: [facility];  Service: General;  Laterality: N/A;   RECTAL PROLAPSE REPAIR     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   TONSILLECTOMY     VARICOSE VEIN SURGERY      Allergies: Allergies as of 04/17/2023 - Review Complete 04/07/2023  Allergen Reaction  Noted   Glipizide Nausea Only 09/28/2015    Medications: Outpatient Encounter Medications as of 04/17/2023  Medication Sig   albuterol (VENTOLIN HFA) 108 (90 Base) MCG/ACT inhaler Inhale 2 puffs into the lungs every 6 (six) hours as needed for wheezing or shortness of breath.   Alcohol Swabs (B-D SINGLE USE SWABS REGULAR) PADS USE DAILY.   allopurinol (ZYLOPRIM) 300 MG tablet TAKE 1 TABLET EVERY DAY   azelastine (ASTELIN) 0.1 % nasal spray Place 1 spray into both nostrils 2 (two) times daily. Use in each nostril as directed   Calcium Carb-Ergocalciferol 500-200 MG-UNIT TABS Take 1 tablet by mouth daily.   celecoxib (CELEBREX) 200 MG capsule Take 1 capsule (200 mg total) by mouth daily as needed.   colchicine (COLCRYS) 0.6 MG tablet Take 1 tablet (0.6 mg total) by mouth 2 (two) times daily.   fexofenadine (ALLEGRA) 60 MG tablet Take 60 mg by mouth 2 (two) times daily.   FIBER PO Take 1 capsule by mouth in the morning and at bedtime.   fluticasone (FLONASE) 50 MCG/ACT nasal spray Place 1 spray into both nostrils daily.   glucose blood (TRUE METRIX BLOOD GLUCOSE TEST) test strip TEST BLOOD SUGAR ONCE EVERY DAY   Lancet Devices (TRUEDRAW LANCING DEVICE) MISC 1 each by Does not apply route 3 (three) times daily as needed.   Lancets Misc. (ACCU-CHEK SOFTCLIX LANCET DEV) KIT 1 each by Does not apply route daily.   losartan (COZAAR) 100 MG tablet TAKE 1 TABLET EVERY DAY   metFORMIN (GLUCOPHAGE-XR) 500 MG 24 hr tablet TAKE 1 TABLET EVERY DAY WITH BREAKFAST   methocarbamol (ROBAXIN) 500 MG tablet Take 1 tablet (500 mg total) by mouth at bedtime as needed for muscle spasms. This can make you sleepy.   metoprolol succinate (TOPROL-XL) 50 MG 24 hr tablet TAKE 3 TABLETS DAILY WITH OR IMMEDIATELY FOLLOWING A MEAL   mupirocin ointment (BACTROBAN) 2 % PLACE 1 APPLICATION INTO THE NOSE 2 TIMES A DAILY.   omeprazole (PRILOSEC) 20 MG capsule TAKE 1 CAPSULE EVERY DAY   simvastatin (ZOCOR) 40 MG tablet TAKE 1  TABLET AT BEDTIME   traMADol (ULTRAM) 50 MG tablet Take 1 tablet (50 mg total) by mouth every 12 (twelve) hours as needed.   triamcinolone lotion (KENALOG) 0.1 % Apply 1 Application topically 3 (three) times daily. To scalp rash   TRUEplus Lancets 30G MISC 1 each by Does not apply route 2 (two) times daily.   vitamin C (ASCORBIC ACID) 500 MG tablet Take 500 mg by mouth daily.   Vitamin D, Ergocalciferol, (DRISDOL) 1.25 MG (50000 UNIT) CAPS capsule Take 1 capsule (50,000 Units total) by mouth every 7 (seven) days. Take for 8 total doses(weeks)   No facility-administered encounter medications on file as of 04/17/2023.    Social History: Social History   Tobacco Use   Smoking status: Former    Current packs/day: 0.00    Average packs/day: 0.5 packs/day for 30.0 years (15.0 ttl pk-yrs)  Types: Cigarettes    Start date: 08/04/1988    Quit date: 08/04/2018    Years since quitting: 4.6   Smokeless tobacco: Never   Tobacco comments:    Patient declines Lung Cancer Screening.  Vaping Use   Vaping status: Never Used  Substance Use Topics   Alcohol use: Yes    Comment: rarely   Drug use: Never    Family Medical History: Family History  Problem Relation Age of Onset   CAD Father        died age 69   COPD Mother    Diabetes Maternal Grandmother    Breast cancer Neg Hx     Physical Examination: There were no vitals filed for this visit.    Awake, alert, oriented to person, place, and time.  Speech is clear and fluent. Fund of knowledge is appropriate.   Cranial Nerves: Pupils equal round and reactive to light.  Facial tone is symmetric.    Diffse posterior lumbar tenderness.   No abnormal lesions on exposed skin.   Strength: Side Biceps Triceps Deltoid Interossei Grip Wrist Ext. Wrist Flex.  R 5 5 5 5 5 5 5   L 5 5 5 5 5 5 5    Side Iliopsoas Quads Hamstring PF DF EHL  R 5 5 5 5 5 5   L 5 5 5 5 5 5    Reflexes are 1+ and symmetric at the biceps, brachioradialis, patella and  achilles.   Hoffman's is absent.  Clonus is not present.   Bilateral upper and lower extremity sensation is intact to light touch.     Gait is slow and unsteady. She ambulates with a cane.   Medical Decision Making  Imaging: None.  Assessment and Plan: Ms. Laster is a pleasant 67 y.o. female that has noticed some new neck and right shoulder pain. No arm pain. She has limited ROM of her shoulder with pain. She still has issues with balance and dexterity. She has frequent falls.    Cervical MRI shows slight slip C4-C5 and C5-C6 with multilevel facet arthropathy. She has mild central stenosis C5-C6 with mild right and moderate/severe left foraminal stenosis.    Pain in right shoulder is worse with moving arm per patient (no exam done as it was a phone visit). This sounds more shoulder mediated. I don't see anything that would explain her balance or dexterity issues.    She continues with constant LBP with lateral leg pain to her feet. She has numbness, tingling, and weakness in both legs. Pain is much worse with walking and standing. Grocery cart helps.    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 She also has multilevel lumbar spondylosis.    LBP likely is multifactorial, leg pain likely from L4-L5.    Treatment options discussed with patient and following plan made:    - Recommend PT for cervical and lumbar spine. She declines.  - Okay to continue celebrex and ultram from other providers. Continue prn robaxin.  - Referral to PMR at [facility] to discuss possible lumbar injections. She requests to be seen in [place].  - Referral to [facility] in [place] for evaluation of right shoulder pain.  - Referral to neurology at [facility] in [facility] for evaluation of balance and dexterity issues.  - She has 6-8 week history of urinary leakage,  no incontinence. Also with a  history of anal prolapse and had surgery years ago- she has chronic bowel leakage. None of this appears spine mediated. She should follow up with PCP for this.  - May consider cervical flexion/extension xrays if pain gets worse.    MRI showed diffuse T1 hypointensity throughout the bone marrow is nonspecific and can be seen in the setting of obesity, smoking, anemia, or possibly infiltrative marrow process. This is diffuse and likely not worrisome. This was reviewed with Dr. Katrinka Blazing and he recommends repeat lumbar MRI in 3-6 months (if no change then nothing more to do).    - Follow up with me in 6-8 weeks for recheck.   I spent a total of 30 minutes in face-to-face and non-face-to-face activities related to this patient's care today including review of outside records, review of imaging, review of symptoms, physical exam, discussion of differential diagnosis, discussion of treatment options, and documentation.   Drake Leach PA-C Dept. of Neurosurgery

## 2023-04-17 ENCOUNTER — Ambulatory Visit: Payer: Medicare HMO | Admitting: Orthopedic Surgery

## 2023-04-19 DIAGNOSIS — D0472 Carcinoma in situ of skin of left lower limb, including hip: Secondary | ICD-10-CM | POA: Diagnosis not present

## 2023-04-19 DIAGNOSIS — D485 Neoplasm of uncertain behavior of skin: Secondary | ICD-10-CM | POA: Diagnosis not present

## 2023-04-19 DIAGNOSIS — L570 Actinic keratosis: Secondary | ICD-10-CM | POA: Diagnosis not present

## 2023-04-28 ENCOUNTER — Other Ambulatory Visit: Payer: Self-pay | Admitting: Internal Medicine

## 2023-04-28 DIAGNOSIS — M5116 Intervertebral disc disorders with radiculopathy, lumbar region: Secondary | ICD-10-CM

## 2023-05-01 NOTE — Telephone Encounter (Signed)
Requested medication (s) are due for refill today:yes  Requested medication (s) are on the active medication list: yes  Last refill:  03/30/23 #60  Future visit scheduled: yes  Notes to clinic:  Refill cannot be delegated to NT   Requested Prescriptions  Pending Prescriptions Disp Refills   traMADol (ULTRAM) 50 MG tablet [Pharmacy Med Name: traMADol HCl 50 MG Oral Tablet] 60 tablet 0    Sig: TAKE 1 TABLET BY MOUTH EVERY 12 HOURS AS NEEDED     Not Delegated - Analgesics:  Opioid Agonists Failed - 04/28/2023  1:16 PM      Failed - This refill cannot be delegated      Failed - Urine Drug Screen completed in last 360 days      Failed - Valid encounter within last 3 months    Recent Outpatient Visits           1 month ago Essential (primary) hypertension   [place] [facility] at [facility], Nyoka Cowden, MD   5 months ago Claudication of both lower extremities [facility])   [place] [facility] at [facility], Nyoka Cowden, MD   7 months ago SBO (small bowel obstruction) [facility])   [place] [facility] at [facility], Nyoka Cowden, MD   1 year ago Muscle cramps at night   [facility] at [facility], Nyoka Cowden, MD   1 year ago Primary osteoarthritis of left knee   [facility] at [facility], Ocie Bob, MD       Future Appointments             In 3 months Judithann Graves, Nyoka Cowden, MD [facility] at [facility], [facility]

## 2023-05-22 ENCOUNTER — Ambulatory Visit
Admission: EM | Admit: 2023-05-22 | Discharge: 2023-05-22 | Disposition: A | Discharge status: Home / Self Care | Payer: Medicare HMO | Attending: Emergency Medicine | Admitting: Emergency Medicine

## 2023-05-22 ENCOUNTER — Ambulatory Visit: Payer: Medicare HMO

## 2023-05-22 DIAGNOSIS — Z96651 Presence of right artificial knee joint: Secondary | ICD-10-CM | POA: Diagnosis not present

## 2023-05-22 DIAGNOSIS — Z043 Encounter for examination and observation following other accident: Secondary | ICD-10-CM | POA: Diagnosis not present

## 2023-05-22 DIAGNOSIS — B354 Tinea corporis: Secondary | ICD-10-CM

## 2023-05-22 DIAGNOSIS — L989 Disorder of the skin and subcutaneous tissue, unspecified: Secondary | ICD-10-CM

## 2023-05-22 DIAGNOSIS — M25561 Pain in right knee: Secondary | ICD-10-CM

## 2023-05-22 MED ORDER — FLUCONAZOLE 150 MG PO TABS: 150.0000 mg | ORAL_TABLET | ORAL | 0 refills | Status: AC

## 2023-05-22 MED ORDER — CEPHALEXIN 500 MG PO CAPS: 500.0000 mg | ORAL_CAPSULE | Freq: Two times a day (BID) | ORAL | 0 refills | Status: AC

## 2023-05-22 MED ORDER — KETOROLAC TROMETHAMINE 30 MG/ML IJ SOLN
30.0000 mg | Freq: Once | INTRAMUSCULAR | Status: AC
  Administered 2023-05-22: 30 mg via INTRAMUSCULAR

## 2023-05-22 MED ORDER — PREDNISONE 10 MG (21) PO TBPK: ORAL_TABLET | Freq: Every day | ORAL | 0 refills | Status: DC

## 2023-05-22 NOTE — Discharge Instructions (Addendum)
For your knee - Knee x-rays pending, you will be notified of results via telephone -You have been given an injection of Toradol to help with pain and inflammation prior to discharge, should take effect within the hour -Starting tomorrow take prednisone every morning with food as directed to continue the above process, stop use of Celebrex while you are using this medicine, may use Tylenol or any topical medicine as an alternative -You may use heat over the affected area in 10 to 15-minute intervals -Knee sleeve has been applied for you to use whenever completing activity for stability and support -May elevate on pillows whenever sitting and lying -If symptoms continue to persist please follow-up with orthopedics, information is on front page  For your underarms -Take 1 Diflucan in 3 days take second dose -Continue use of topical antifungal powder using twice daily -Ensure the area stays clean and dry, this may require you to dry your armpits throughout the day as a warm dark environment will make your symptoms worse -If your symptoms worsen please follow-up with your primary doctor for further management  Skin lesion to the left leg -There is redness and pain on exam, this also could be possible due to area being cancerous -As the redness is new we will place you on antibiotic prophylactically, take cephalexin twice daily for 5 days -Keep upcoming appointment for removal and have dermatologist recheck skin at that time -Please follow-up with your primary doctor for any further concerns

## 2023-05-22 NOTE — ED Triage Notes (Signed)
Pt c/o R knee pain due to fall 1 wk ago.

## 2023-05-22 NOTE — ED Provider Notes (Signed)
[facility]    CSN: [id number] Arrival date & time: 05/22/23  1448      History   Chief Complaint Chief Complaint  Patient presents with   Knee Pain   Fall    HPI Kiara Campbell is a 67 y.o. female.   Patient presents for evaluation of right-sided knee pain and swelling beginning 7 days ago after fall.  Endorses that she was walking when the right foot got caught and dragged against the ground causing her to fall landing directly on the right knee, hitting left shin and left flank.  Left flank pain has already begun to improve but knee pain is persisting.  Can be felt with all range of motion.  Takes daily tramadol, Acrania pills Celebrex which has provided no relief to symptoms.     Patient concerned with erythema and itching present to the bilateral underarms.  Using nystatin powder without relief.  Symptoms have not worsened but have not improved.  Patient concerned with cancerous lesion present to the left lower shin.  Followed by dermatology and site has been biopsied twice, has upcoming appointment for removal.  Endorses that after the second biopsy surrounding skin became erythematous and site has become tender with pain radiating down towards the ankle.  Denies presence of drainage or fever.      Past Medical History:  Diagnosis Date   Calcium blood increased 12/23/2013   Diabetes mellitus without complication (HCC)    Gout    Hyperlipidemia    Hypertension    Osteopenia     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/19/2018   Tobacco use disorder, severe, in  early remission 08/09/2017   Obesity (BMI 30-39.9) 07/17/2017   GERD (gastroesophageal reflux disease) 07/12/2017   Type II diabetes mellitus with complication (HCC) 06/02/2015   Fibrocystic breast disease 12/03/2014   Colon polyp 12/03/2014   Essential (primary) hypertension 12/03/2014   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   OP (osteoporosis) 12/03/2014   Hyperlipidemia associated with type 2 diabetes mellitus (HCC) 12/03/2014    Past Surgical History:  Procedure Laterality Date   ABDOMINAL HYSTERECTOMY     partial   ABDOMINAL HYSTERECTOMY     APPENDECTOMY     BREAST CYST ASPIRATION     neg not sure which side   COLONOSCOPY WITH PROPOFOL N/A 02/03/2021   Procedure: COLONOSCOPY WITH PROPOFOL;  Surgeon: Pasty Spillers, MD;  Location: [facility];  Service: Endoscopy;  Laterality: N/A;   ESOPHAGOGASTRODUODENOSCOPY N/A 02/03/2021   Procedure: ESOPHAGOGASTRODUODENOSCOPY (EGD);  Surgeon: Pasty Spillers, MD;  Location: [facility];  Service: Endoscopy;  Laterality: N/A;   JOINT REPLACEMENT     LESION DESTRUCTION N/A 08/18/2021   Procedure: BIOPSY OF ANORECTAL ULCER;  Surgeon: Andria Meuse, MD;  Location: [facility];  Service: General;  Laterality: N/A;   RECTAL PROLAPSE REPAIR     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   TONSILLECTOMY     VARICOSE VEIN SURGERY      OB History   No obstetric history on file.  Home Medications    Prior to Admission medications   Medication Sig Start Date End Date Taking? Authorizing Provider  albuterol (VENTOLIN HFA) 108 (90 Base) MCG/ACT inhaler Inhale 2 puffs into the lungs every 6 (six) hours as needed for wheezing or shortness of breath. 03/30/23  Yes Reubin Milan, MD  Alcohol Swabs (B-D SINGLE USE SWABS REGULAR) PADS USE DAILY. 03/19/20  Yes Reubin Milan, MD  allopurinol (ZYLOPRIM) 300 MG tablet TAKE 1 TABLET EVERY DAY 08/23/22  Yes Reubin Milan, MD  azelastine (ASTELIN)  0.1 % nasal spray Place 1 spray into both nostrils 2 (two) times daily. Use in each nostril as directed 03/09/21  Yes Reubin Milan, MD  Calcium Carb-Ergocalciferol 500-200 MG-UNIT TABS Take 1 tablet by mouth daily.   Yes [provider]  celecoxib (CELEBREX) 200 MG capsule Take 1 capsule (200 mg total) by mouth daily as needed. 04/02/23  Yes Reubin Milan, MD  colchicine (COLCRYS) 0.6 MG tablet Take 1 tablet (0.6 mg total) by mouth 2 (two) times daily. 07/06/21  Yes Reubin Milan, MD  fexofenadine (ALLEGRA) 60 MG tablet Take 60 mg by mouth 2 (two) times daily.   Yes [provider]  FIBER PO Take 1 capsule by mouth in the morning and at bedtime.   Yes [provider]  fluticasone (FLONASE) 50 MCG/ACT nasal spray Place 1 spray into both nostrils daily. 02/12/15  Yes [provider]  glucose blood (TRUE METRIX BLOOD GLUCOSE TEST) test strip TEST BLOOD SUGAR ONCE EVERY DAY 09/07/22  Yes Reubin Milan, MD  Lancet Devices (TRUEDRAW LANCING DEVICE) MISC 1 each by Does not apply route 3 (three) times daily as needed. 08/29/22  Yes Reubin Milan, MD  Lancets Misc. (ACCU-CHEK SOFTCLIX LANCET DEV) KIT 1 each by Does not apply route daily. 04/07/21  Yes Reubin Milan, MD  losartan (COZAAR) 100 MG tablet TAKE 1 TABLET EVERY DAY 09/05/22  Yes Reubin Milan, MD  metFORMIN (GLUCOPHAGE-XR) 500 MG 24 hr tablet TAKE 1 TABLET EVERY DAY WITH BREAKFAST 08/27/22  Yes Reubin Milan, MD  methocarbamol (ROBAXIN) 500 MG tablet Take 1 tablet (500 mg total) by mouth at bedtime as needed for muscle spasms. This can make you sleepy. 01/23/23  Yes Drake Leach, PA-C  metoprolol succinate (TOPROL-XL) 50 MG 24 hr tablet TAKE 3 TABLETS DAILY WITH OR IMMEDIATELY FOLLOWING A MEAL 08/27/22  Yes Reubin Milan, MD  mupirocin ointment (BACTROBAN) 2 % PLACE 1 APPLICATION INTO THE NOSE 2 TIMES A DAILY. 08/11/21  Yes Reubin Milan, MD  omeprazole (PRILOSEC) 20 MG capsule TAKE 1 CAPSULE  EVERY DAY 07/28/22  Yes Reubin Milan, MD  simvastatin (ZOCOR) 40 MG tablet TAKE 1 TABLET AT BEDTIME 04/02/23  Yes Reubin Milan, MD  traMADol (ULTRAM) 50 MG tablet TAKE 1 TABLET BY MOUTH EVERY 12 HOURS AS NEEDED 05/01/23  Yes Reubin Milan, MD  triamcinolone lotion (KENALOG) 0.1 % Apply 1 Application topically 3 (three) times daily. To scalp rash 12/02/22  Yes Reubin Milan, MD  TRUEplus Lancets 30G MISC 1 each by Does not apply route 2 (two) times daily. 08/29/22  Yes Reubin Milan, MD  vitamin C (ASCORBIC ACID) 500 MG tablet Take 500 mg by mouth daily.   Yes [provider]  Vitamin D, Ergocalciferol, (DRISDOL) 1.25 MG (50000 UNIT) CAPS capsule Take 1 capsule (50,000 Units total) by mouth every 7 (seven) days. Take for 8 total doses(weeks) 08/11/21  Yes Jerrol Banana, MD    Family History Family History  Problem Relation Age of Onset   CAD Father        died age 23   COPD Mother    Diabetes Maternal Grandmother    Breast cancer Neg Hx     Social History Social History   Tobacco Use   Smoking status: Former    Current packs/day: 0.00    Average packs/day: 0.5 packs/day for 30.0 years (15.0 ttl pk-yrs)    Types: Cigarettes    Start date: 08/04/1988    Quit date: 08/04/2018    Years since quitting: 4.8   Smokeless tobacco: Never   Tobacco comments:    Patient declines Lung Cancer Screening.  Vaping Use   Vaping status: Never Used  Substance Use Topics   Alcohol use: Yes    Comment: rarely   Drug use: Never     Allergies   Glipizide   Review of Systems Review of Systems   Physical Exam Triage Vital Signs ED Triage Vitals  Encounter Vitals Group     BP 05/22/23 1511 (!) 192/78     Systolic BP Percentile --      Diastolic BP Percentile --      Pulse Rate 05/22/23 1511 78     Resp 05/22/23 1511 16     Temp 05/22/23 1511 98.3 F (36.8 C)     Temp Source 05/22/23 1511 Oral     SpO2 05/22/23 1511 94 %     Weight 05/22/23 1511 176 lb (79.8  kg)     Height 05/22/23 1511 5\' 6"  (1.676 m)     Head Circumference --      Peak Flow --      Pain Score 05/22/23 1514 6     Pain Loc --      Pain Education --      Exclude from Growth Chart --    No data found.  Updated Vital Signs BP (!) 192/78 (BP Location: Left Arm)   Pulse 78   Temp 98.3 F (36.8 C) (Oral)   Resp 16   Ht 5\' 6"  (1.676 m)   Wt 176 lb (79.8 kg)   LMP 09/27/1995   SpO2 94%   BMI 28.41 kg/m   Visual Acuity Right Eye Distance:   Left Eye Distance:   Bilateral Distance:    Right Eye Near:   Left Eye Near:    Bilateral Near:     Physical Exam Constitutional:      Appearance: Normal appearance.  Eyes:     Extraocular Movements: Extraocular movements intact.  Pulmonary:     Effort: Pulmonary effort is normal.  Musculoskeletal:     Comments: Tenderness is present to the anterior, lateral and medial aspect of the right knee, mild swelling to the anterior,  able to bear weight and complete all range of motion but pain is elicited with movements, 2+ popliteal pulse  Skin:    Comments: 2 x 3 blackened lesion present to the anterior of the left shin, scant blood noted, tender to palpation, surrounding erythema without streaking, no swelling present  Erythema present on the bilateral axilla, Joeann Steppe clumpy drainage noted  Neurological:     Mental Status: She is alert and oriented to person, place, and time. Mental status is at baseline.      UC Treatments / Results  Labs (all labs ordered are listed, but only abnormal results are displayed) Labs Reviewed - No data to display  EKG   Radiology No results found.  Procedures Procedures (including critical care time)  Medications Ordered in UC Medications - No data to display  Initial Impression / Assessment and Plan / UC Course  I have reviewed the triage vital signs and the nursing notes.  Pertinent labs & imaging results that were available during my care of the patient were reviewed by me and  considered in my medical decision making (see chart for details).  Acute pain of right knee, tenia corporis, left leg skin lesion  X-rays pending, unable to provide compression sleeve due to sizing but given Ace bandage to use for stability and support, Toradol IM given and prescribed prednisone prescribed for outpatient use recommended RICE, heat massage stretching and walking referral given to orthopedics if symptoms continue to persist  Presentation of the bilateral axilla is consistent with yeast, patient currently using topical products, Diflucan sent to pharmacy to be used additionally and advised to keep areas dry as possible recommended follow-up with PCP for further management if needed  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   Valinda Hoar, NP 05/22/23 1704

## 2023-05-23 ENCOUNTER — Telehealth: Payer: Self-pay | Admitting: Emergency Medicine

## 2023-05-23 NOTE — Telephone Encounter (Signed)
Reported x-ray results via telephone, 2 patient identifiers used, continue with treatment plan as discussed at initial vision

## 2023-05-25 DIAGNOSIS — C44729 Squamous cell carcinoma of skin of left lower limb, including hip: Secondary | ICD-10-CM | POA: Diagnosis not present

## 2023-05-25 DIAGNOSIS — D0472 Carcinoma in situ of skin of left lower limb, including hip: Secondary | ICD-10-CM | POA: Diagnosis not present

## 2023-05-31 ENCOUNTER — Other Ambulatory Visit: Payer: Self-pay | Admitting: Internal Medicine

## 2023-05-31 DIAGNOSIS — M10071 Idiopathic gout, right ankle and foot: Secondary | ICD-10-CM

## 2023-05-31 NOTE — Telephone Encounter (Signed)
Requested medication (s) are due for refill today -yes  Requested medication (s) are on the active medication list -yes  Future visit scheduled -yes  Last refill: 08/23/22 #90  Notes to clinic: fails lab protocol- over 1 year- 07/23/21  Requested Prescriptions  Pending Prescriptions Disp Refills   allopurinol (ZYLOPRIM) 300 MG tablet [Pharmacy Med Name: Allopurinol Oral Tablet 300 MG] 90 tablet 3    Sig: TAKE 1 TABLET EVERY DAY     Endocrinology:  Gout Agents - allopurinol Failed - 05/31/2023 12:36 PM      Failed - Uric Acid in normal range and within 360 days    Uric Acid  Date Value Ref Range Status  07/23/2021 5.6 3.0 - 7.2 mg/dL Final    Comment:               Therapeutic target for gout patients: <6.0         Failed - CBC within normal limits and completed in the last 12 months    WBC  Date Value Ref Range Status  03/15/2022 8.3 4.0 - 10.5 K/uL Final   RBC  Date Value Ref Range Status  03/15/2022 3.27 (L) 3.87 - 5.11 MIL/uL Final   Hemoglobin  Date Value Ref Range Status  03/15/2022 10.4 (L) 12.0 - 15.0 g/dL Final  82/95/6213 9.7 (L) 11.1 - 15.9 g/dL Final   HCT  Date Value Ref Range Status  03/15/2022 32.0 (L) 36.0 - 46.0 % Final   Hematocrit  Date Value Ref Range Status  07/23/2021 28.6 (L) 34.0 - 46.6 % Final   MCHC  Date Value Ref Range Status  03/15/2022 32.5 30.0 - 36.0 g/dL Final   [facility]  Date Value Ref Range Status  03/15/2022 31.8 26.0 - 34.0 pg Final   MCV  Date Value Ref Range Status  03/15/2022 97.9 80.0 - 100.0 fL Final  07/23/2021 96 79 - 97 fL Final   No results found for: "PLTCOUNTKUC", "LABPLAT", "POCPLA" RDW  Date Value Ref Range Status  03/15/2022 20.6 (H) 11.5 - 15.5 % Final  07/23/2021 20.2 (H) 11.7 - 15.4 % Final         Passed - Cr in normal range and within 360 days    Creatinine, Ser  Date Value Ref Range Status  03/30/2023 0.93 0.57 - 1.00 mg/dL Final         Passed - Valid encounter within last 12 months    Recent  Outpatient Visits           2 months ago Essential (primary) hypertension   [place] [facility] at [facility], Nyoka Cowden, MD   6 months ago Claudication of both lower extremities [facility])   [place] [facility] at [facility], Nyoka Cowden, MD   8 months ago SBO (small bowel obstruction) [facility])   [place] [facility] at [facility], Nyoka Cowden, MD   1 year ago Muscle cramps at night   [facility] at [facility], Nyoka Cowden, MD   1 year ago Primary osteoarthritis of left knee   [facility] at [facility], Ocie Bob, MD       Future Appointments             In 2 months Judithann Graves Nyoka Cowden, MD [facility] Primary Care [facility] Medicine at [facility], [facility]  Requested Prescriptions  Pending Prescriptions Disp Refills   allopurinol (ZYLOPRIM) 300 MG tablet [Pharmacy Med Name: Allopurinol Oral Tablet 300 MG] 90 tablet 3    Sig: TAKE 1 TABLET EVERY DAY     Endocrinology:  Gout Agents - allopurinol Failed - 05/31/2023 12:36 PM      Failed - Uric Acid in normal range and within 360 days    Uric Acid  Date Value Ref Range Status  07/23/2021 5.6 3.0 - 7.2 mg/dL Final    Comment:               Therapeutic target for gout patients: <6.0         Failed - CBC within normal limits and completed in the last 12 months    WBC  Date Value Ref Range Status  03/15/2022 8.3 4.0 - 10.5 K/uL Final   RBC  Date Value Ref Range Status  03/15/2022 3.27 (L) 3.87 - 5.11 MIL/uL Final   Hemoglobin  Date Value Ref Range Status  03/15/2022 10.4 (L) 12.0 - 15.0 g/dL Final  29/56/2130 9.7 (L) 11.1 - 15.9 g/dL Final   HCT  Date Value Ref Range Status  03/15/2022 32.0 (L) 36.0 - 46.0 % Final   Hematocrit  Date Value Ref Range Status  07/23/2021 28.6 (L) 34.0 - 46.6 % Final   MCHC  Date Value Ref  Range Status  03/15/2022 32.5 30.0 - 36.0 g/dL Final   [facility]  Date Value Ref Range Status  03/15/2022 31.8 26.0 - 34.0 pg Final   MCV  Date Value Ref Range Status  03/15/2022 97.9 80.0 - 100.0 fL Final  07/23/2021 96 79 - 97 fL Final   No results found for: "PLTCOUNTKUC", "LABPLAT", "POCPLA" RDW  Date Value Ref Range Status  03/15/2022 20.6 (H) 11.5 - 15.5 % Final  07/23/2021 20.2 (H) 11.7 - 15.4 % Final         Passed - Cr in normal range and within 360 days    Creatinine, Ser  Date Value Ref Range Status  03/30/2023 0.93 0.57 - 1.00 mg/dL Final         Passed - Valid encounter within last 12 months    Recent Outpatient Visits           2 months ago Essential (primary) hypertension   [place] [facility] at [facility], Nyoka Cowden, MD   6 months ago Claudication of both lower extremities [facility])   [place] [facility] Medicine at [facility], Nyoka Cowden, MD   8 months ago SBO (small bowel obstruction) [facility])   [place] [facility] at [facility], Nyoka Cowden, MD   1 year ago Muscle cramps at night   [facility] at [facility], Nyoka Cowden, MD   1 year ago Primary osteoarthritis of left knee   [facility] & [facility] Medicine at [facility], Ocie Bob, MD       Future Appointments             In 2 months Judithann Graves Nyoka Cowden, MD [facility] Care [facility] Medicine at [facility], [facility]

## 2023-05-31 NOTE — Telephone Encounter (Signed)
Please review.  KP

## 2023-06-04 DIAGNOSIS — T148XXA Other injury of unspecified body region, initial encounter: Secondary | ICD-10-CM | POA: Diagnosis not present

## 2023-06-04 DIAGNOSIS — L089 Local infection of the skin and subcutaneous tissue, unspecified: Secondary | ICD-10-CM | POA: Diagnosis not present

## 2023-06-04 DIAGNOSIS — Z7984 Long term (current) use of oral hypoglycemic drugs: Secondary | ICD-10-CM | POA: Diagnosis not present

## 2023-06-04 DIAGNOSIS — L03116 Cellulitis of left lower limb: Secondary | ICD-10-CM | POA: Diagnosis not present

## 2023-06-04 DIAGNOSIS — E119 Type 2 diabetes mellitus without complications: Secondary | ICD-10-CM | POA: Diagnosis not present

## 2023-06-04 LAB — CBC AND DIFFERENTIAL
HCT: 33 — AB (ref 36–46)
Hemoglobin: 10.5 — AB (ref 12.0–16.0)
Platelets: 302 10*3/uL (ref 150–400)
WBC: 10.1

## 2023-06-08 DIAGNOSIS — L0390 Cellulitis, unspecified: Secondary | ICD-10-CM | POA: Diagnosis not present

## 2023-06-22 DIAGNOSIS — L0390 Cellulitis, unspecified: Secondary | ICD-10-CM | POA: Diagnosis not present

## 2023-06-24 ENCOUNTER — Other Ambulatory Visit: Payer: Self-pay | Admitting: Internal Medicine

## 2023-06-24 DIAGNOSIS — I10 Essential (primary) hypertension: Secondary | ICD-10-CM

## 2023-06-26 NOTE — Telephone Encounter (Signed)
Requested Prescriptions  Pending Prescriptions Disp Refills   losartan (COZAAR) 100 MG tablet [Pharmacy Med Name: Losartan Potassium Oral Tablet 100 MG] 90 tablet 0    Sig: TAKE 1 TABLET EVERY DAY     Cardiovascular:  Angiotensin Receptor Blockers Failed - 06/26/2023  1:04 PM      Failed - Last BP in normal range    BP Readings from Last 1 Encounters:  05/22/23 (!) 192/78         Failed - Valid encounter within last 6 months    Recent Outpatient Visits           2 months ago Essential (primary) hypertension   [place] [facility] at [facility], Nyoka Cowden, MD   6 months ago Claudication of both lower extremities [facility])   [place] [facility] Medicine at [facility], Nyoka Cowden, MD   9 months ago SBO (small bowel obstruction) [facility])   [place] [facility] Sports Medicine at [facility], Nyoka Cowden, MD   1 year ago Muscle cramps at night   [facility] Care & [facility] Medicine at [facility], Nyoka Cowden, MD   1 year ago Primary osteoarthritis of left knee   [facility] at [facility], Ocie Bob, MD       Future Appointments             In 1 month Reubin Milan, MD [facility] Care & [facility] at [facility], [facility]            Passed - Cr in normal range and within 180 days    Creatinine, Ser  Date Value Ref Range Status  03/30/2023 0.93 0.57 - 1.00 mg/dL Final         Passed - K in normal range and within 180 days    Potassium  Date Value Ref Range Status  03/30/2023 4.6 3.5 - 5.2 mmol/L Final         Passed - Patient is not pregnant

## 2023-06-30 ENCOUNTER — Other Ambulatory Visit: Payer: Self-pay | Admitting: Internal Medicine

## 2023-06-30 DIAGNOSIS — M170 Bilateral primary osteoarthritis of knee: Secondary | ICD-10-CM

## 2023-07-04 ENCOUNTER — Other Ambulatory Visit: Payer: Self-pay | Admitting: Internal Medicine

## 2023-07-04 ENCOUNTER — Encounter: Payer: Self-pay | Admitting: Internal Medicine

## 2023-07-04 DIAGNOSIS — M5116 Intervertebral disc disorders with radiculopathy, lumbar region: Secondary | ICD-10-CM

## 2023-07-04 MED ORDER — TRAMADOL HCL 50 MG PO TABS: 50.0000 mg | ORAL_TABLET | Freq: Two times a day (BID) | ORAL | 0 refills | Status: DC | PRN

## 2023-07-04 NOTE — Telephone Encounter (Signed)
 Requested medication (s) are due for refill today: yes  Requested medication (s) are on the active medication list: yes    Last refill: 04/02/23  #90  0 refills  Future visit scheduled yes 08/11/23  Notes to clinic:Failed due to labs, please review.  Requested Prescriptions  Pending Prescriptions Disp Refills   celecoxib  (CELEBREX ) 200 MG capsule [Pharmacy Med Name: Celecoxib  Oral Capsule 200 MG] 90 capsule 3    Sig: TAKE 1 CAPSULE EVERY DAY AS NEEDED     Analgesics:  COX2 Inhibitors Failed - 07/04/2023  4:21 PM      Failed - Manual Review: Labs are only required if the patient has taken medication for more than 8 weeks.      Failed - HGB in normal range and within 360 days    Hemoglobin  Date Value Ref Range Status  03/15/2022 10.4 (L) 12.0 - 15.0 g/dL Final  98/79/7976 9.7 (L) 11.1 - 15.9 g/dL Final         Failed - HCT in normal range and within 360 days    HCT  Date Value Ref Range Status  03/15/2022 32.0 (L) 36.0 - 46.0 % Final   Hematocrit  Date Value Ref Range Status  07/23/2021 28.6 (L) 34.0 - 46.6 % Final         Passed - Cr in normal range and within 360 days    Creatinine, Ser  Date Value Ref Range Status  03/30/2023 0.93 0.57 - 1.00 mg/dL Final         Passed - AST in normal range and within 360 days    AST  Date Value Ref Range Status  03/30/2023 21 0 - 40 IU/L Final         Passed - ALT in normal range and within 360 days    ALT  Date Value Ref Range Status  03/30/2023 19 0 - 32 IU/L Final         Passed - eGFR is 30 or above and within 360 days    GFR calc Af Amer  Date Value Ref Range Status  04/07/2020 60 >59 mL/min/1.73 Final    Comment:    **[facility] currently reports eGFR in compliance with the current**   recommendations of the [employer]. [facility] will   update reporting as new guidelines are published from the NKF-ASN   Task force.    GFR, Estimated  Date Value Ref Range Status  03/15/2022 52 (L) >60 mL/min Final     Comment:    (NOTE) Calculated using the CKD-EPI Creatinine Equation (2021)    eGFR  Date Value Ref Range Status  03/30/2023 67 >59 mL/min/1.73 Final         Passed - Patient is not pregnant      Passed - Valid encounter within last 12 months    Recent Outpatient Visits           3 months ago Essential (primary) hypertension   [place] [facility] at [facility], [name], MD   7 months ago Claudication of both lower extremities [facility])   [place] [facility] at [facility], [name], MD   10 months ago SBO (small bowel obstruction) [facility])   [place] [facility] Medicine at [facility], [name], MD   1 year ago Muscle cramps at night   [facility] at [facility], Laura H,  MD   1 year ago Primary osteoarthritis of left knee   [facility] & [facility] at [facility], Selinda PARAS, MD       Future Appointments             In 1 month Justus, [name], MD [facility] Primary Care & Sports Medicine at [facility], [facility]

## 2023-07-04 NOTE — Telephone Encounter (Signed)
Please review.  KP

## 2023-07-10 ENCOUNTER — Other Ambulatory Visit: Payer: Self-pay | Admitting: Internal Medicine

## 2023-07-10 DIAGNOSIS — E118 Type 2 diabetes mellitus with unspecified complications: Secondary | ICD-10-CM

## 2023-07-10 DIAGNOSIS — M170 Bilateral primary osteoarthritis of knee: Secondary | ICD-10-CM

## 2023-07-13 NOTE — Telephone Encounter (Signed)
 Requested medications are due for refill today.  yes  Requested medications are on the active medications list.  yes  Last refill. Celebrex  04/02/2023 #90 0 rf, Lancet device 08/29/2022   Future visit scheduled.   yes  Notes to clinic.  Labs are expired.     Requested Prescriptions  Pending Prescriptions Disp Refills   Lancet Devices (TRUEDRAW LANCING DEVICE) MISC [Pharmacy Med Name: TRUEdraw Lancing Device Miscellaneous] 1 each 3    Sig: USE AS DIRECTED     Endocrinology: Diabetes - Testing Supplies Passed - 07/13/2023  9:38 AM      Passed - Valid encounter within last 12 months    Recent Outpatient Visits           3 months ago Essential (primary) hypertension   [place] [facility] at [facility], [name], MD   7 months ago Claudication of both lower extremities [facility])   [place] [facility] at [facility], [name], MD   10 months ago SBO (small bowel obstruction) [facility])   [place] [facility] & Sports Medicine at [facility], [name], MD   1 year ago Muscle cramps at night   [facility] at [facility], [name], MD   1 year ago Primary osteoarthritis of left knee   [facility] Primary Care & Sports Medicine at [facility], Selinda PARAS, MD       Future Appointments             In 4 weeks [name], MD [facility] Primary Care & Sports Medicine at [facility] [employer] [facility]             celecoxib  (CELEBREX ) 200 MG capsule [Pharmacy Med Name: Celecoxib  Oral Capsule 200 MG] 180 capsule 3    Sig: TAKE 1 CAPSULE TWICE DAILY     Analgesics:  COX2 Inhibitors Failed - 07/13/2023  9:38 AM      Failed - Manual Review: Labs are only required if the patient has taken medication for more than 8 weeks.      Failed - HGB in normal range and within 360 days    Hemoglobin  Date Value Ref Range Status  03/15/2022 10.4 (L) 12.0 - 15.0 g/dL  Final  98/79/7976 9.7 (L) 11.1 - 15.9 g/dL Final         Failed - HCT in normal range and within 360 days    HCT  Date Value Ref Range Status  03/15/2022 32.0 (L) 36.0 - 46.0 % Final   Hematocrit  Date Value Ref Range Status  07/23/2021 28.6 (L) 34.0 - 46.6 % Final         Passed - Cr in normal range and within 360 days    Creatinine, Ser  Date Value Ref Range Status  03/30/2023 0.93 0.57 - 1.00 mg/dL Final         Passed - AST in normal range and within 360 days    AST  Date Value Ref Range Status  03/30/2023 21 0 - 40 IU/L Final         Passed - ALT in normal range and within 360 days    ALT  Date Value Ref Range Status  03/30/2023 19 0 - 32 IU/L Final         Passed - eGFR is 30 or above and within 360 days    GFR calc Af [place]  Date Value Ref Range Status  04/07/2020 60 >59 mL/min/1.73 Final    Comment:    **[facility] currently reports eGFR in compliance with the current**   recommendations of the [employer]. [facility] will   update reporting as new guidelines are published from the NKF-ASN   Task force.    GFR, Estimated  Date Value Ref Range Status  03/15/2022 52 (L) >60 mL/min Final    Comment:    (NOTE) Calculated using the CKD-EPI Creatinine Equation (2021)    eGFR  Date Value Ref Range Status  03/30/2023 67 >59 mL/min/1.73 Final         Passed - Patient is not pregnant      Passed - Valid encounter within last 12 months    Recent Outpatient Visits           3 months ago Essential (primary) hypertension   [place] [facility] at [facility], [name], MD   7 months ago Claudication of both lower extremities [facility])   [place] [facility] Medicine at [facility], [name], MD   10 months ago SBO (small bowel obstruction) [facility])   [place] [facility] & Sports Medicine at [facility], [name], MD   1 year ago Muscle cramps at night   [facility]  Care & [facility] at [facility], [name], MD   1 year ago Primary osteoarthritis of left knee   [facility] Primary Care & Sports Medicine at [facility], Selinda PARAS, MD       Future Appointments             In 4 weeks [name], MD [facility] Primary Care & Sports Medicine at [facility], [facility]

## 2023-07-18 DIAGNOSIS — M67432 Ganglion, left wrist: Secondary | ICD-10-CM | POA: Diagnosis not present

## 2023-07-18 DIAGNOSIS — R2689 Other abnormalities of gait and mobility: Secondary | ICD-10-CM | POA: Diagnosis not present

## 2023-07-18 DIAGNOSIS — G20C Parkinsonism, unspecified: Secondary | ICD-10-CM | POA: Diagnosis not present

## 2023-07-19 ENCOUNTER — Other Ambulatory Visit: Payer: Self-pay | Admitting: Internal Medicine

## 2023-07-19 DIAGNOSIS — K219 Gastro-esophageal reflux disease without esophagitis: Secondary | ICD-10-CM

## 2023-07-20 NOTE — Telephone Encounter (Signed)
Requested by interface surescripts. Future visit in 3 weeks. Requested Prescriptions  Pending Prescriptions Disp Refills   omeprazole (PRILOSEC) 20 MG capsule [Pharmacy Med Name: Omeprazole Oral Capsule Delayed Release 20 MG] 90 capsule 0    Sig: TAKE 1 CAPSULE EVERY DAY     Gastroenterology: Proton Pump Inhibitors Passed - 07/20/2023  8:17 AM      Passed - Valid encounter within last 12 months    Recent Outpatient Visits           3 months ago Essential (primary) hypertension   [place] [facility] at [facility], Nyoka Cowden, MD   7 months ago Claudication of both lower extremities [facility])   [place] [facility] Sports Medicine at [facility], Nyoka Cowden, MD   10 months ago SBO (small bowel obstruction) [facility])   [place] [facility] Care & Sports Medicine at [facility], Nyoka Cowden, MD   1 year ago Muscle cramps at night   [facility] Medicine at [facility], Nyoka Cowden, MD   1 year ago Primary osteoarthritis of left knee   [facility] Primary Care & Sports Medicine at [facility], Ocie Bob, MD       Future Appointments             In 3 weeks Judithann Graves Nyoka Cowden, MD [facility] Primary Care & Sports Medicine at [facility], [facility]

## 2023-07-25 ENCOUNTER — Other Ambulatory Visit: Payer: Self-pay | Admitting: Neurology

## 2023-07-25 DIAGNOSIS — G20C Parkinsonism, unspecified: Secondary | ICD-10-CM

## 2023-07-26 ENCOUNTER — Encounter: Payer: Self-pay | Admitting: Neurology

## 2023-08-01 DIAGNOSIS — M67432 Ganglion, left wrist: Secondary | ICD-10-CM | POA: Diagnosis not present

## 2023-08-01 DIAGNOSIS — E785 Hyperlipidemia, unspecified: Secondary | ICD-10-CM | POA: Diagnosis not present

## 2023-08-01 DIAGNOSIS — E1169 Type 2 diabetes mellitus with other specified complication: Secondary | ICD-10-CM | POA: Diagnosis not present

## 2023-08-06 ENCOUNTER — Other Ambulatory Visit: Payer: Self-pay | Admitting: Internal Medicine

## 2023-08-06 DIAGNOSIS — M5116 Intervertebral disc disorders with radiculopathy, lumbar region: Secondary | ICD-10-CM

## 2023-08-08 ENCOUNTER — Ambulatory Visit
Admission: RE | Admit: 2023-08-08 | Discharge: 2023-08-08 | Disposition: A | Discharge status: Home / Self Care | Payer: Medicare HMO | Source: Ambulatory Visit | Attending: Neurology | Admitting: Neurology

## 2023-08-08 DIAGNOSIS — R531 Weakness: Secondary | ICD-10-CM | POA: Diagnosis not present

## 2023-08-08 DIAGNOSIS — I6782 Cerebral ischemia: Secondary | ICD-10-CM | POA: Diagnosis not present

## 2023-08-08 DIAGNOSIS — G319 Degenerative disease of nervous system, unspecified: Secondary | ICD-10-CM | POA: Diagnosis not present

## 2023-08-08 DIAGNOSIS — G20C Parkinsonism, unspecified: Secondary | ICD-10-CM

## 2023-08-08 DIAGNOSIS — R262 Difficulty in walking, not elsewhere classified: Secondary | ICD-10-CM | POA: Diagnosis not present

## 2023-08-08 NOTE — Telephone Encounter (Signed)
 Requested medication (s) are due for refill today-yes  Requested medication (s) are on the active medication list -yes  Future visit scheduled -yes  Last refill: 07/04/23 #60  Notes to clinic: non delegated Rx  Requested Prescriptions  Pending Prescriptions Disp Refills   traMADol  (ULTRAM ) 50 MG tablet [Pharmacy Med Name: traMADol  HCl 50 MG Oral Tablet] 60 tablet 0    Sig: TAKE 1 TABLET BY MOUTH EVERY 12 HOURS AS NEEDED     Not Delegated - Analgesics:  Opioid Agonists Failed - 08/08/2023  8:40 AM      Failed - This refill cannot be delegated      Failed - Urine Drug Screen completed in last 360 days      Failed - Valid encounter within last 3 months    Recent Outpatient Visits           4 months ago Essential (primary) hypertension   [place] [facility] Medicine at [facility], [name], MD   8 months ago Claudication of both lower extremities [facility])   [place] [facility] Care & Sports Medicine at [facility], [name], MD   11 months ago SBO (small bowel obstruction) [facility])   [place] [facility] Care & Sports Medicine at [facility], [name], MD   1 year ago Muscle cramps at night   [facility] Care & Sports Medicine at [facility], [name], MD   1 year ago Primary osteoarthritis of left knee   [facility] Primary Care & Sports Medicine at [facility], Selinda PARAS, MD       Future Appointments             In 3 days [name], MD [facility] Primary Care & Sports Medicine at [facility], [facility]               Requested Prescriptions  Pending Prescriptions Disp Refills   traMADol  (ULTRAM ) 50 MG tablet [Pharmacy Med Name: traMADol  HCl 50 MG Oral Tablet] 60 tablet 0    Sig: TAKE 1 TABLET BY MOUTH EVERY 12 HOURS AS NEEDED     Not Delegated - Analgesics:  Opioid Agonists Failed - 08/08/2023  8:40 AM      Failed - This refill cannot be delegated      Failed - Urine Drug  Screen completed in last 360 days      Failed - Valid encounter within last 3 months    Recent Outpatient Visits           4 months ago Essential (primary) hypertension   [place] [facility] Medicine at [facility], [name], MD   8 months ago Claudication of both lower extremities [facility])    Primary Care & Sports Medicine at [facility], [name], MD   11 months ago SBO (small bowel obstruction) [facility])    Primary Care & Sports Medicine at [facility], [name], MD   1 year ago Muscle cramps at night   [facility] at [facility], [name], MD   1 year ago Primary osteoarthritis of left knee   [facility] Primary Care & Sports Medicine at [facility], Selinda PARAS, MD       Future Appointments             In 3 days [name], MD [facility] Primary Care &  Sports Medicine at [employer], [facility]

## 2023-08-09 ENCOUNTER — Telehealth: Payer: Self-pay | Admitting: Internal Medicine

## 2023-08-09 ENCOUNTER — Other Ambulatory Visit: Payer: Self-pay | Admitting: Internal Medicine

## 2023-08-09 DIAGNOSIS — M5116 Intervertebral disc disorders with radiculopathy, lumbar region: Secondary | ICD-10-CM

## 2023-08-09 NOTE — Telephone Encounter (Signed)
 Copied from CRM [id number]. Topic: Clinical - Medication Refill >> Aug 09, 2023  2:12 PM Carla L wrote: Reason for CRM: Pt's husband calling to follow up on tramadol  refill request. Pt upset as he has not gotten the refill and is now out. Pt requested it from pharmacy on 08/06/2023. Pt's husband requesting call once prescription is sent in.

## 2023-08-09 NOTE — Telephone Encounter (Signed)
 Sent message to provider from patient chart. (Husband)

## 2023-08-11 ENCOUNTER — Encounter: Payer: Self-pay | Admitting: Internal Medicine

## 2023-08-11 ENCOUNTER — Ambulatory Visit (INDEPENDENT_AMBULATORY_CARE_PROVIDER_SITE_OTHER): Payer: Medicare HMO | Admitting: Internal Medicine

## 2023-08-11 VITALS — BP 126/74 | HR 68 | Ht 66.0 in | Wt 183.0 lb

## 2023-08-11 DIAGNOSIS — E785 Hyperlipidemia, unspecified: Secondary | ICD-10-CM | POA: Diagnosis not present

## 2023-08-11 DIAGNOSIS — M5116 Intervertebral disc disorders with radiculopathy, lumbar region: Secondary | ICD-10-CM | POA: Diagnosis not present

## 2023-08-11 DIAGNOSIS — E118 Type 2 diabetes mellitus with unspecified complications: Secondary | ICD-10-CM

## 2023-08-11 DIAGNOSIS — G20A1 Parkinson's disease without dyskinesia, without mention of fluctuations: Secondary | ICD-10-CM

## 2023-08-11 DIAGNOSIS — Z7984 Long term (current) use of oral hypoglycemic drugs: Secondary | ICD-10-CM | POA: Diagnosis not present

## 2023-08-11 DIAGNOSIS — I10 Essential (primary) hypertension: Secondary | ICD-10-CM | POA: Diagnosis not present

## 2023-08-11 DIAGNOSIS — E1169 Type 2 diabetes mellitus with other specified complication: Secondary | ICD-10-CM | POA: Diagnosis not present

## 2023-08-11 DIAGNOSIS — N1831 Chronic kidney disease, stage 3a: Secondary | ICD-10-CM | POA: Diagnosis not present

## 2023-08-11 DIAGNOSIS — Z1231 Encounter for screening mammogram for malignant neoplasm of breast: Secondary | ICD-10-CM

## 2023-08-11 MED ORDER — TRAMADOL HCL 50 MG PO TABS: 50.0000 mg | ORAL_TABLET | Freq: Two times a day (BID) | ORAL | 2 refills | Status: DC | PRN

## 2023-08-11 MED ORDER — METOPROLOL SUCCINATE ER 50 MG PO TB24: 50.0000 mg | ORAL_TABLET | Freq: Every day | ORAL | 3 refills | Status: DC

## 2023-08-11 MED ORDER — LOSARTAN POTASSIUM 100 MG PO TABS: 100.0000 mg | ORAL_TABLET | Freq: Every day | ORAL | 3 refills | Status: DC

## 2023-08-11 MED ORDER — METFORMIN HCL ER 500 MG PO TB24: 500.0000 mg | ORAL_TABLET | Freq: Every day | ORAL | 3 refills | Status: DC

## 2023-08-11 NOTE — Assessment & Plan Note (Addendum)
 LDL is  Lab Results  Component Value Date   LDLCALC 69 03/30/2023   Current regimen is simvastatin .  Tolerating medications well without issues.

## 2023-08-11 NOTE — Assessment & Plan Note (Signed)
 Controlled BP with normal exam. Current regimen is losartan and metoprolol. Will continue same medications; encourage continued reduced sodium diet.

## 2023-08-11 NOTE — Assessment & Plan Note (Signed)
 New onset highly suspected by Neurology Improved symptoms on Sinemet

## 2023-08-11 NOTE — Assessment & Plan Note (Signed)
 Blood sugars stable without hypoglycemic symptoms or events. Currently managed with MTF. Changes made last visit are none. Lab Results  Component Value Date   HGBA1C 6.3 (H) 03/30/2023

## 2023-08-11 NOTE — Patient Instructions (Signed)
 Call [facility] to schedule your mammogram at [phone].

## 2023-08-11 NOTE — Progress Notes (Signed)
 Date:  08/11/2023   Name:  Kiara Campbell   DOB:  03-Feb-1956   MRN:  [id number]   Chief Complaint: Diabetes and Hypertension  Diabetes Pertinent negatives for hypoglycemia include no headaches or tremors. Pertinent negatives for diabetes include no chest pain, no fatigue, no polydipsia and no polyuria.  Hypertension Pertinent negatives include no chest pain, headaches, palpitations or shortness of breath.  Hyperlipidemia Pertinent negatives include no chest pain or shortness of breath.  Back Pain This is a chronic problem. The problem occurs constantly. The problem is unchanged. The pain is present in the lumbar spine. The quality of the pain is described as aching. Pertinent negatives include no abdominal pain, chest pain, dysuria, fever, headaches or numbness. She has tried analgesics (also wants to get a letter for aquatherapy so she can get a hot tub covered.) for the symptoms.  Parkinson's disease - recently diagnosed as likely.  Neurology wants to do a skin biopsy to rule out other causes.   Review of Systems  Constitutional:  Negative for appetite change, fatigue, fever and unexpected weight change.  HENT:  Negative for tinnitus and trouble swallowing.   Eyes:  Negative for visual disturbance.  Respiratory:  Negative for cough, chest tightness and shortness of breath.   Cardiovascular:  Negative for chest pain, palpitations and leg swelling.  Gastrointestinal:  Negative for abdominal pain.  Endocrine: Negative for polydipsia and polyuria.  Genitourinary:  Negative for dysuria and hematuria.  Musculoskeletal:  Positive for back pain. Negative for arthralgias.  Skin:  Positive for wound (on left skin from skin biopsy).  Neurological:  Negative for tremors, numbness and headaches.  Psychiatric/Behavioral:  Negative for dysphoric mood.      Lab Results  Component Value Date   NA 143 03/30/2023   K 4.6 03/30/2023   CO2 27 03/30/2023   GLUCOSE 101 (H) 03/30/2023   BUN 17  03/30/2023   CREATININE 0.93 03/30/2023   CALCIUM 9.8 03/30/2023   EGFR 67 03/30/2023   GFRNONAA 52 (L) 03/15/2022   Lab Results  Component Value Date   CHOL 136 03/30/2023   HDL 48 03/30/2023   LDLCALC 69 03/30/2023   TRIG 106 03/30/2023   CHOLHDL 2.8 03/30/2023   Lab Results  Component Value Date   TSH 1.797 03/15/2022   Lab Results  Component Value Date   HGBA1C 6.3 (H) 03/30/2023   Lab Results  Component Value Date   WBC 10.1 06/04/2023   HGB 10.5 (A) 06/04/2023   HCT 33 (A) 06/04/2023   MCV 97.9 03/15/2022   PLT 302 06/04/2023   Lab Results  Component Value Date   ALT 19 03/30/2023   AST 21 03/30/2023   ALKPHOS 88 03/30/2023   BILITOT 0.5 03/30/2023   Lab Results  Component Value Date   VD25OH 48.8 09/05/2022     Patient Active Problem List   Diagnosis Date Noted   Parkinson's disease (HCC) 08/11/2023   Shortness of breath 03/30/2023   Stage 3a chronic kidney disease (HCC) 09/05/2022   Primary osteoarthritis of left knee 03/22/2022   Displaced fracture of fifth metatarsal bone, left foot, initial encounter for closed fracture 08/11/2021   Anorectal ulcer 07/23/2021   Esophageal dysphagia    Schatzki's ring    Gastric erythema    Hiatal hernia    Hx of skin cancer, basal cell 11/19/2018   Tobacco use disorder, moderate, in sustained remission 08/09/2017   Obesity (BMI 30-39.9) 07/17/2017   GERD (gastroesophageal reflux disease) 07/12/2017  Type II diabetes mellitus with complication (HCC) 06/02/2015   Fibrocystic breast disease 12/03/2014   Colon polyp 12/03/2014   Essential (primary) hypertension 12/03/2014   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   OP (osteoporosis) 12/03/2014   Hyperlipidemia associated with type 2 diabetes mellitus (HCC) 12/03/2014    Allergies  Allergen Reactions   Glipizide Nausea Only    Past Surgical History:  Procedure Laterality Date   ABDOMINAL HYSTERECTOMY     partial   ABDOMINAL HYSTERECTOMY      APPENDECTOMY     BREAST CYST ASPIRATION     neg not sure which side   COLONOSCOPY WITH PROPOFOL  N/A 02/03/2021   Procedure: COLONOSCOPY WITH PROPOFOL ;  Surgeon: Janalyn Keene NOVAK, MD;  Location: [facility] ENDOSCOPY;  Service: Endoscopy;  Laterality: N/A;   ESOPHAGOGASTRODUODENOSCOPY N/A 02/03/2021   Procedure: ESOPHAGOGASTRODUODENOSCOPY (EGD);  Surgeon: Janalyn Keene NOVAK, MD;  [facility]: [facility] ENDOSCOPY;  Service: Endoscopy;  Laterality: N/A;   JOINT REPLACEMENT     LESION DESTRUCTION N/A 08/18/2021   Procedure: BIOPSY OF ANORECTAL ULCER;  Surgeon: Teresa Lonni HERO, MD;  [facility];  Service: General;  Laterality: N/A;   RECTAL PROLAPSE REPAIR     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   TONSILLECTOMY     VARICOSE VEIN SURGERY      Social History   Tobacco Use   Smoking status: Former    Current packs/day: 0.00    Average packs/day: 0.5 packs/day for 30.0 years (15.0 ttl pk-yrs)    Types: Cigarettes    Start date: 08/04/1988    Quit date: 08/04/2018    Years since quitting: 5.0   Smokeless tobacco: Never   Tobacco comments:    Patient declines Lung Cancer Screening.  Vaping Use   Vaping status: Never Used  Substance Use Topics   Alcohol use: Yes    Comment: rarely   Drug use: Never     Medication list has been reviewed and updated.  Current Meds  Medication Sig   albuterol  (VENTOLIN  HFA) 108 (90 Base) MCG/ACT inhaler Inhale 2 puffs into the lungs every 6 (six) hours as needed for wheezing or shortness of breath.   Alcohol Swabs (B-D SINGLE USE SWABS REGULAR) PADS USE DAILY.   allopurinol  (ZYLOPRIM ) 300 MG tablet TAKE 1 TABLET EVERY DAY   azelastine  (ASTELIN ) 0.1 % nasal spray Place 1 spray into both nostrils 2 (two) times daily. Use in each nostril as directed   Calcium Carb-Ergocalciferol  500-200 MG-UNIT TABS Take 1 tablet by mouth daily.   carbidopa-levodopa (SINEMET IR) 25-100 MG tablet Take 1 tablet by mouth 3 (three) times daily.    celecoxib  (CELEBREX ) 200 MG capsule TAKE 1 CAPSULE TWICE DAILY   colchicine  (COLCRYS ) 0.6 MG tablet Take 1 tablet (0.6 mg total) by mouth 2 (two) times daily.   fexofenadine (ALLEGRA) 60 MG tablet Take 60 mg by mouth 2 (two) times daily.   FIBER PO Take 1 capsule by mouth in the morning and at bedtime.   fluticasone (FLONASE) 50 MCG/ACT nasal spray Place 1 spray into both nostrils daily.   glucose blood (TRUE METRIX BLOOD GLUCOSE TEST) test strip TEST BLOOD SUGAR ONCE EVERY DAY   Lancet Devices (TRUEDRAW LANCING DEVICE) MISC USE AS DIRECTED   Lancets Misc. (ACCU-CHEK SOFTCLIX LANCET DEV) KIT 1 each by Does not apply route daily.   methocarbamol  (ROBAXIN ) 500 MG tablet Take 1 tablet (500 mg total) by mouth at bedtime as needed for muscle spasms. This can make you sleepy.  mupirocin  ointment (BACTROBAN ) 2 % PLACE 1 APPLICATION INTO THE NOSE 2 TIMES A DAILY.   omeprazole  (PRILOSEC) 20 MG capsule TAKE 1 CAPSULE EVERY DAY   simvastatin  (ZOCOR ) 40 MG tablet TAKE 1 TABLET AT BEDTIME   triamcinolone  lotion (KENALOG ) 0.1 % Apply 1 Application topically 3 (three) times daily. To scalp rash   TRUEplus Lancets 30G MISC 1 each by Does not apply route 2 (two) times daily.   vitamin C (ASCORBIC ACID) 500 MG tablet Take 500 mg by mouth daily.   Vitamin D , Ergocalciferol , (DRISDOL ) 1.25 MG (50000 UNIT) CAPS capsule Take 1 capsule (50,000 Units total) by mouth every 7 (seven) days. Take for 8 total doses(weeks)   [DISCONTINUED] losartan  (COZAAR ) 100 MG tablet TAKE 1 TABLET EVERY DAY   [DISCONTINUED] metFORMIN  (GLUCOPHAGE -XR) 500 MG 24 hr tablet TAKE 1 TABLET EVERY DAY WITH BREAKFAST   [DISCONTINUED] metoprolol  succinate (TOPROL -XL) 50 MG 24 hr tablet TAKE 3 TABLETS DAILY WITH OR IMMEDIATELY FOLLOWING A MEAL   [DISCONTINUED] traMADol  (ULTRAM ) 50 MG tablet Take 1 tablet (50 mg total) by mouth every 12 (twelve) hours as needed.       08/11/2023    3:15 PM 03/30/2023   10:17 AM 12/02/2022    1:30 PM 04/11/2022     3:40 PM  GAD 7 : Generalized Anxiety Score  Nervous, Anxious, on Edge 0 1 1 1   Control/stop worrying 0 1 0 0  Worry too much - different things 0 1 0 0  Trouble relaxing 0 1 1 2   Restless 0 1 0 0  Easily annoyed or irritable 0 1 0 2  Afraid - awful might happen 0 1 1 0  Total GAD 7 Score 0 7 3 5   Anxiety Difficulty Not difficult at all Somewhat difficult Not difficult at all Not difficult at all       08/11/2023    3:15 PM 03/30/2023   10:16 AM 12/02/2022    1:29 PM  Depression screen PHQ 2/9  Decreased Interest 0 1 1  Down, Depressed, Hopeless 0 1 1  PHQ - 2 Score 0 2 2  Altered sleeping  3 0  Tired, decreased energy  3 2  Change in appetite  2 0  Feeling bad or failure about yourself   1 0  Trouble concentrating  1 1  Moving slowly or fidgety/restless  1 0  Suicidal thoughts  0 0  PHQ-9 Score  13 5  Difficult doing work/chores  Not difficult at all Not difficult at all    BP Readings from Last 3 Encounters:  08/11/23 126/74  05/22/23 (!) 192/78  04/07/23 (!) 148/72    Physical Exam Vitals and nursing note reviewed.  Constitutional:      General: She is not in acute distress.    Appearance: She is well-developed.  HENT:     Head: Normocephalic and atraumatic.  Neck:     Vascular: No carotid bruit.  Cardiovascular:     Rate and Rhythm: Normal rate and regular rhythm.     Pulses: Normal pulses.     Heart sounds: No murmur heard. Pulmonary:     Effort: Pulmonary effort is normal. No respiratory distress.     Breath sounds: No wheezing or rhonchi.  Musculoskeletal:     Cervical back: Normal range of motion.     Right lower leg: No edema.     Left lower leg: No edema.  Lymphadenopathy:     Cervical: No cervical adenopathy.  Skin:  General: Skin is warm and dry.     Findings: Lesion (left shin) present. No rash.  Neurological:     Mental Status: She is alert and oriented to person, place, and time.     Gait: Gait abnormal (walks with a cane.).  Psychiatric:         Mood and Affect: Mood normal.        Behavior: Behavior normal.    Diabetic Foot Exam - Simple   Simple Foot Form Diabetic Foot exam was performed with the following findings: Yes 08/11/2023  3:27 PM  Visual Inspection No deformities, no ulcerations, no other skin breakdown bilaterally: Yes Sensation Testing Intact to touch and monofilament testing bilaterally: Yes Pulse Check Posterior Tibialis and Dorsalis pulse intact bilaterally: Yes Comments      Wt Readings from Last 3 Encounters:  08/11/23 183 lb (83 kg)  05/22/23 176 lb (79.8 kg)  04/07/23 180 lb (81.6 kg)    BP 126/74   Pulse 68   Ht 5' 6 (1.676 m)   Wt 183 lb (83 kg)   LMP 09/27/1995   SpO2 97%   BMI 29.54 kg/m   Assessment and Plan:  Problem List Items Addressed This Visit       Unprioritized   Essential (primary) hypertension - Primary (Chronic)   Controlled BP with normal exam. Current regimen is losartan  and metoprolol . Will continue same medications; encourage continued reduced sodium diet.       Relevant Medications   losartan  (COZAAR ) 100 MG tablet   metoprolol  succinate (TOPROL -XL) 50 MG 24 hr tablet   Hyperlipidemia associated with type 2 diabetes mellitus (HCC) (Chronic)   LDL is  Lab Results  Component Value Date   LDLCALC 69 03/30/2023   Current regimen is simvastatin .  Tolerating medications well without issues.       Relevant Medications   metFORMIN  (GLUCOPHAGE -XR) 500 MG 24 hr tablet   losartan  (COZAAR ) 100 MG tablet   metoprolol  succinate (TOPROL -XL) 50 MG 24 hr tablet   Type II diabetes mellitus with complication (HCC) (Chronic)   Blood sugars stable without hypoglycemic symptoms or events. Currently managed with MTF. Changes made last visit are none. Lab Results  Component Value Date   HGBA1C 6.3 (H) 03/30/2023         Relevant Medications   metFORMIN  (GLUCOPHAGE -XR) 500 MG 24 hr tablet   losartan  (COZAAR ) 100 MG tablet   Other Relevant Orders    Microalbumin / creatinine urine ratio   Hemoglobin A1c   Lumbar disc herniation with radiculopathy   Relevant Medications   carbidopa-levodopa (SINEMET IR) 25-100 MG tablet   traMADol  (ULTRAM ) 50 MG tablet   Stage 3a chronic kidney disease (HCC)   Relevant Orders   Basic metabolic panel   Parkinson's disease (HCC)   New onset highly suspected by Neurology Improved symptoms on Sinemet      Relevant Medications   carbidopa-levodopa (SINEMET IR) 25-100 MG tablet   Other Visit Diagnoses       Long term current use of oral hypoglycemic drug         Encounter for screening mammogram for breast cancer       Relevant Orders   MM 3D SCREENING MAMMOGRAM BILATERAL BREAST       Return in about 4 months (around 12/09/2023) for DM, HTN.    Leita HILARIO Adie, MD [facility] Primary Care and Sports Medicine [place]

## 2023-08-12 LAB — BASIC METABOLIC PANEL
BUN/Creatinine Ratio: 22 (ref 12–28)
BUN: 22 mg/dL (ref 8–27)
CO2: 24 mmol/L (ref 20–29)
Calcium: 10 mg/dL (ref 8.7–10.3)
Chloride: 104 mmol/L (ref 96–106)
Creatinine, Ser: 0.98 mg/dL (ref 0.57–1.00)
Glucose: 115 mg/dL — ABNORMAL HIGH (ref 70–99)
Potassium: 4.9 mmol/L (ref 3.5–5.2)
Sodium: 142 mmol/L (ref 134–144)
eGFR: 62 mL/min/{1.73_m2} (ref >59)

## 2023-08-12 LAB — MICROALBUMIN / CREATININE URINE RATIO
Creatinine, Urine: 72.6 mg/dL
Microalb/Creat Ratio: 253 mg/g{creat} — ABNORMAL HIGH (ref 0–29)
Microalbumin, Urine: 183.5 ug/mL

## 2023-08-12 LAB — HEMOGLOBIN A1C
Est. average glucose Bld gHb Est-mCnc: 131 mg/dL
Hgb A1c MFr Bld: 6.2 % — ABNORMAL HIGH (ref 4.8–5.6)

## 2023-08-14 ENCOUNTER — Encounter: Payer: Self-pay | Admitting: Internal Medicine

## 2023-09-07 ENCOUNTER — Other Ambulatory Visit: Payer: Self-pay | Admitting: Internal Medicine

## 2023-09-07 DIAGNOSIS — I10 Essential (primary) hypertension: Secondary | ICD-10-CM

## 2023-09-07 NOTE — Telephone Encounter (Signed)
 Call to patient- she wants her Rx to go to [facility]- will forward Rx for her.  Requested Prescriptions  Pending Prescriptions Disp Refills   losartan (COZAAR) 100 MG tablet [Pharmacy Med Name: Losartan Potassium Oral Tablet 100 MG] 90 tablet 3    Sig: TAKE 1 TABLET EVERY DAY     Cardiovascular:  Angiotensin Receptor Blockers Failed - 09/07/2023  3:49 PM      Failed - Valid encounter within last 6 months    Recent Outpatient Visits           5 months ago Essential (primary) hypertension   [place] [facility] at [facility], Nyoka Cowden, MD   9 months ago Claudication of both lower extremities [facility])   [place] [facility] Medicine at [facility], Nyoka Cowden, MD   1 year ago SBO (small bowel obstruction) [facility])   [place] [facility] at [facility], Nyoka Cowden, MD   1 year ago Muscle cramps at night   [facility] at [facility], Nyoka Cowden, MD   1 year ago Primary osteoarthritis of left knee   [place] [facility] at [facility], Ocie Bob, MD       Future Appointments             In 3 months Judithann Graves Nyoka Cowden, MD [facility] at [facility], [facility]            Passed - Cr in normal range and within 180 days    Creatinine, Ser  Date Value Ref Range Status  08/11/2023 0.98 0.57 - 1.00 mg/dL Final         Passed - K in normal range and within 180 days    Potassium  Date Value Ref Range Status  08/11/2023 4.9 3.5 - 5.2 mmol/L Final         Passed - Patient is not pregnant      Passed - Last BP in normal range    BP Readings from Last 1 Encounters:  08/11/23 126/74

## 2023-09-18 ENCOUNTER — Other Ambulatory Visit: Payer: Self-pay | Admitting: Internal Medicine

## 2023-09-18 DIAGNOSIS — E118 Type 2 diabetes mellitus with unspecified complications: Secondary | ICD-10-CM

## 2023-09-19 NOTE — Telephone Encounter (Signed)
 Requested Prescriptions  Refused Prescriptions Disp Refills   metFORMIN (GLUCOPHAGE-XR) 500 MG 24 hr tablet [Pharmacy Med Name: metFORMIN HCl ER Oral Tablet Extended Release 24 Hour 500 MG] 90 tablet 3    Sig: TAKE 1 TABLET EVERY DAY WITH BREAKFAST     Endocrinology:  Diabetes - Biguanides Failed - 09/19/2023  1:32 PM      Failed - B12 Level in normal range and within 720 days    Vitamin B-12  Date Value Ref Range Status  07/23/2021 1,432 (H) 232 - 1,245 pg/mL Final         Failed - Valid encounter within last 6 months    Recent Outpatient Visits           5 months ago Essential (primary) hypertension   [place] [facility] at [facility], Nyoka Cowden, MD   9 months ago Claudication of both lower extremities [facility])   [place] [facility] Medicine at [facility], Nyoka Cowden, MD   1 year ago SBO (small bowel obstruction) [facility])   [place] [facility] Medicine at [facility], Nyoka Cowden, MD   1 year ago Muscle cramps at night   [facility] Medicine at [facility], Nyoka Cowden, MD   1 year ago Primary osteoarthritis of left knee   [place] [facility] at [facility], Ocie Bob, MD       Future Appointments             In 2 months Judithann Graves Nyoka Cowden, MD [facility] Primary Care & Sports Medicine at [facility], [facility]            Failed - CBC within normal limits and completed in the last 12 months    WBC  Date Value Ref Range Status  06/04/2023 10.1  Final  03/15/2022 8.3 4.0 - 10.5 K/uL Final   RBC  Date Value Ref Range Status  03/15/2022 3.27 (L) 3.87 - 5.11 MIL/uL Final   Hemoglobin  Date Value Ref Range Status  06/04/2023 10.5 (A) 12.0 - 16.0 Final  07/23/2021 9.7 (L) 11.1 - 15.9 g/dL Final   HCT  Date Value Ref Range Status  06/04/2023 33 (A) 36 - 46 Final   Hematocrit  Date Value Ref Range Status  07/23/2021 28.6  (L) 34.0 - 46.6 % Final   MCHC  Date Value Ref Range Status  03/15/2022 32.5 30.0 - 36.0 g/dL Final   [facility]  Date Value Ref Range Status  03/15/2022 31.8 26.0 - 34.0 pg Final   MCV  Date Value Ref Range Status  03/15/2022 97.9 80.0 - 100.0 fL Final  07/23/2021 96 79 - 97 fL Final   No results found for: "PLTCOUNTKUC", "LABPLAT", "POCPLA" RDW  Date Value Ref Range Status  03/15/2022 20.6 (H) 11.5 - 15.5 % Final  07/23/2021 20.2 (H) 11.7 - 15.4 % Final         Passed - Cr in normal range and within 360 days    Creatinine, Ser  Date Value Ref Range Status  08/11/2023 0.98 0.57 - 1.00 mg/dL Final         Passed - HBA1C is between 0 and 7.9 and within 180 days    Hgb A1c MFr Bld  Date Value Ref Range Status  08/11/2023 6.2 (H) 4.8 - 5.6 % Final    Comment:  Prediabetes: 5.7 - 6.4          Diabetes: >6.4          Glycemic control for adults with diabetes: <7.0          Passed - eGFR in normal range and within 360 days    GFR calc Af Amer  Date Value Ref Range Status  04/07/2020 60 >59 mL/min/1.73 Final    Comment:    **[facility] currently reports eGFR in compliance with the current**   recommendations of the [employer]. [facility] will   update reporting as new guidelines are published from the NKF-ASN   Task force.    GFR, Estimated  Date Value Ref Range Status  03/15/2022 52 (L) >60 mL/min Final    Comment:    (NOTE) Calculated using the CKD-EPI Creatinine Equation (2021)    eGFR  Date Value Ref Range Status  08/11/2023 62 >59 mL/min/1.73 Final

## 2023-10-01 ENCOUNTER — Other Ambulatory Visit: Payer: Self-pay | Admitting: Internal Medicine

## 2023-10-01 DIAGNOSIS — K219 Gastro-esophageal reflux disease without esophagitis: Secondary | ICD-10-CM

## 2023-10-03 NOTE — Telephone Encounter (Signed)
 Requested Prescriptions  Pending Prescriptions Disp Refills   omeprazole (PRILOSEC) 20 MG capsule [Pharmacy Med Name: Omeprazole Oral Capsule Delayed Release 20 MG] 90 capsule 0    Sig: TAKE 1 CAPSULE EVERY DAY     Gastroenterology: Proton Pump Inhibitors Passed - 10/03/2023  3:20 PM      Passed - Valid encounter within last 12 months    Recent Outpatient Visits           1 month ago Essential (primary) hypertension   [place] [facility] at [facility], Nyoka Cowden, MD       Future Appointments             In 2 months Judithann Graves, Nyoka Cowden, MD [facility] Medicine at [facility], [facility]

## 2023-10-09 ENCOUNTER — Telehealth: Payer: Self-pay | Admitting: Internal Medicine

## 2023-10-09 NOTE — Telephone Encounter (Unsigned)
 Copied from CRM [id number]. Topic: Medicare AWV >> Oct 09, 2023 10:47 AM Payton Doughty wrote: Reason for CRM: Called LVM 10/09/2023 to schedule AWV. Please schedule Virtual or Telehealth visits ONLY.   Verlee Rossetti; Care Guide Ambulatory Clinical Support [place] [facility] Direct Dial: [phone]

## 2023-11-10 ENCOUNTER — Other Ambulatory Visit: Payer: Self-pay | Admitting: Internal Medicine

## 2023-11-10 DIAGNOSIS — M5116 Intervertebral disc disorders with radiculopathy, lumbar region: Secondary | ICD-10-CM

## 2023-11-13 NOTE — Telephone Encounter (Signed)
 Requested medications are due for refill today.  yes  Requested medications are on the active medications list.  yes  Last refill. 08/11/2023 #60 2 rf  Future visit scheduled.   yes  Notes to clinic.  Refill not delegated.    Requested Prescriptions  Pending Prescriptions Disp Refills   traMADol  (ULTRAM ) 50 MG tablet [Pharmacy Med Name: traMADol  HCl 50 MG Oral Tablet] 60 tablet 0    Sig: TAKE 1 TABLET BY MOUTH EVERY 12 HOURS AS NEEDED     Not Delegated - Analgesics:  Opioid Agonists Failed - 11/13/2023  1:36 PM      Failed - This refill cannot be delegated      Failed - Urine Drug Screen completed in last 360 days      Failed - Valid encounter within last 3 months    Recent Outpatient Visits           3 months ago Essential (primary) hypertension   [place] [facility] at [facility], Chales [place], MD       Future Appointments             In 4 weeks Gala Jubilee [place], MD [facility] Primary Care & Sports Medicine at [facility], [facility]

## 2023-11-13 NOTE — Telephone Encounter (Signed)
 Please review

## 2023-11-15 ENCOUNTER — Other Ambulatory Visit: Payer: Self-pay | Admitting: Internal Medicine

## 2023-11-15 DIAGNOSIS — I10 Essential (primary) hypertension: Secondary | ICD-10-CM

## 2023-11-16 NOTE — Telephone Encounter (Signed)
 Requested medications are due for refill today.  no  Requested medications are on the active medications list.  yes  Last refill. 08/11/2023 #270 3 rf  Future visit scheduled.   yes  Notes to clinic.  Sig on request does not match sig on med list. Please review.     Requested Prescriptions  Pending Prescriptions Disp Refills   metoprolol  succinate (TOPROL -XL) 50 MG 24 hr tablet [Pharmacy Med Name: Metoprolol  Succinate ER Oral Tablet Extended Release 24 Hour 50 MG] 270 tablet 3    Sig: TAKE 3 TABLETS DAILY WITH OR IMMEDIATELY FOLLOWING A MEAL     Cardiovascular:  Beta Blockers Passed - 11/16/2023  2:18 PM      Passed - Last BP in normal range    BP Readings from Last 1 Encounters:  08/11/23 126/74         Passed - Last Heart Rate in normal range    Pulse Readings from Last 1 Encounters:  08/11/23 68         Passed - Valid encounter within last 6 months    Recent Outpatient Visits           3 months ago Essential (primary) hypertension   [place] [facility] Medicine at [facility], Chales [place], MD       Future Appointments             In 3 weeks Gala Jubilee, [place], MD [facility] Primary [facility] Sports Medicine at [facility], [facility]

## 2023-11-16 NOTE — Telephone Encounter (Signed)
 Med refill

## 2023-11-21 ENCOUNTER — Encounter (INDEPENDENT_AMBULATORY_CARE_PROVIDER_SITE_OTHER): Payer: Self-pay

## 2023-12-11 ENCOUNTER — Ambulatory Visit: Payer: Medicare HMO | Admitting: Internal Medicine

## 2023-12-15 ENCOUNTER — Other Ambulatory Visit: Payer: Self-pay | Admitting: Internal Medicine

## 2023-12-15 DIAGNOSIS — K219 Gastro-esophageal reflux disease without esophagitis: Secondary | ICD-10-CM

## 2023-12-18 NOTE — Telephone Encounter (Signed)
 Requested Prescriptions  Pending Prescriptions Disp Refills   omeprazole  (PRILOSEC) 20 MG capsule [Pharmacy Med Name: Omeprazole  Oral Capsule Delayed Release 20 MG] 90 capsule 1    Sig: TAKE 1 CAPSULE EVERY DAY     Gastroenterology: Proton Pump Inhibitors Passed - 12/18/2023 11:52 AM      Passed - Valid encounter within last 12 months    Recent Outpatient Visits           4 months ago Essential (primary) hypertension   [place] [facility] Medicine at [facility], Chales [place], MD       Future Appointments             In 1 week Gala Jubilee, Chales [place], MD [facility] Primary Care & Sports Medicine at [facility], [facility]

## 2023-12-26 LAB — HEMOGLOBIN A1C: Hemoglobin A1C: 6.3

## 2023-12-27 ENCOUNTER — Ambulatory Visit: Admitting: Internal Medicine

## 2023-12-29 ENCOUNTER — Other Ambulatory Visit: Payer: Self-pay | Admitting: Internal Medicine

## 2023-12-29 DIAGNOSIS — M5116 Intervertebral disc disorders with radiculopathy, lumbar region: Secondary | ICD-10-CM

## 2023-12-29 LAB — BASIC METABOLIC PANEL WITH GFR
BUN: 17 (ref 4–21)
CO2: 30 — AB (ref 13–22)
Chloride: 103 (ref 99–108)
Creatinine: 1 (ref 0.5–1.1)
Glucose: 82
Potassium: 4.5 meq/L (ref 3.5–5.1)
Sodium: 142 (ref 137–147)

## 2023-12-29 LAB — COMPREHENSIVE METABOLIC PANEL WITH GFR
Calcium: 9.7 (ref 8.7–10.7)
eGFR: 63

## 2023-12-29 LAB — HEPATIC FUNCTION PANEL
ALT: 14 U/L (ref 7–35)
AST: 20 (ref 13–35)
Alkaline Phosphatase: 83 (ref 25–125)

## 2023-12-31 LAB — CBC AND DIFFERENTIAL
HCT: 31 — AB (ref 36–46)
Hemoglobin: 10.3 — AB (ref 12.0–16.0)
Platelets: 256 K/uL (ref 150–400)
WBC: 10.5

## 2024-01-01 ENCOUNTER — Other Ambulatory Visit: Payer: Self-pay | Admitting: Internal Medicine

## 2024-01-01 DIAGNOSIS — E118 Type 2 diabetes mellitus with unspecified complications: Secondary | ICD-10-CM

## 2024-01-01 NOTE — Telephone Encounter (Signed)
 Requested medication (s) are due for refill today: yes  Requested medication (s) are on the active medication list: yes  Last refill:  11/13/23  Future visit scheduled: yes  Notes to clinic:  Unable to refill per protocol, cannot delegate.      Requested Prescriptions  Pending Prescriptions Disp Refills   traMADol  (ULTRAM ) 50 MG tablet [Pharmacy Med Name: traMADol  HCl 50 MG Oral Tablet] 60 tablet 0    Sig: TAKE 1 TABLET BY MOUTH EVERY 12 HOURS AS NEEDED     Not Delegated - Analgesics:  Opioid Agonists Failed - 01/01/2024  2:52 PM      Failed - This refill cannot be delegated      Failed - Urine Drug Screen completed in last 360 days      Failed - Valid encounter within last 3 months    Recent Outpatient Visits           4 months ago Essential (primary) hypertension   [place] [facility] Medicine at [facility], [name], MD

## 2024-01-02 ENCOUNTER — Telehealth: Payer: Self-pay | Admitting: *Deleted

## 2024-01-02 NOTE — Transitions of Care (Post Inpatient/ED Visit) (Signed)
 01/02/2024  Name: Kiara Campbell MRN: [id number] DOB: Jan 21, 1956  Today's TOC FU Call Status: Today's TOC FU Call Status:: Successful TOC FU Call Completed TOC FU Call Complete Date: 01/02/24 Patient's Name and Date of Birth confirmed.  Transition Care Management Follow-up Telephone Call Date of Discharge: 01/01/24 Discharge Facility: Other ([facility]) Name of Other ([facility]) Discharge Facility: [facility] Type of Discharge: Inpatient Admission Primary Inpatient Discharge Diagnosis:: (L) foot pain/ cellulitis- possible DVT; presumed spider bite How have you been since you were released from the hospital?: Better (I am getting much better, not having any problems and still having some challenges with walking but it is better; I have all of the new medicines. Thanks for getting the doctor appointment scheduled, I have to go now) Any questions or concerns?: No  Items Reviewed: Did you receive and understand the discharge instructions provided?: Yes (briefly reviewed with patient who verbalizes good understanding of same - outside hospital AVS: patient states she does not wish to talk long- she is leaving her home now; declines need for call back later today) Medications obtained,verified, and reconciled?: No Medications Not Reviewed Reasons:: Other: (patient declined medication review- she is getting ready to leave her home for a (non-medical) appointment; she declines call-back later today;confirmed patient obtained/ is taking all newly Rx'd medications as instructed per her report) Any new allergies since your discharge?: No Dietary orders reviewed?: No (patient declined question/ assessment) Do you have support at home?: Yes People in Home [RPT]: spouse Name of Support/Comfort Primary Source: Reports independent in self-care activities; supportive spouse assists as/ if needed/ indicated  Medications Reviewed Today: Medications Reviewed Today     Reviewed by Cecilie Heidel M, RN  (Registered Nurse) on 01/02/24 at 1255  Med List Status: <None>   Medication Order Taking? Sig Documenting Provider Last Dose Status Informant  albuterol  (VENTOLIN  HFA) 108 (90 Base) MCG/ACT inhaler [id number] No Inhale 2 puffs into the lungs every 6 (six) hours as needed for wheezing or shortness of breath. [name], MD Taking Active   Alcohol Swabs (B-D SINGLE USE SWABS REGULAR) PADS [id number] No USE DAILY. [name], MD Taking Active   allopurinol  (ZYLOPRIM ) 300 MG tablet [id number] No TAKE 1 TABLET EVERY DAY [name], MD Taking Active   azelastine  (ASTELIN ) 0.1 % nasal spray [id number] No Place 1 spray into both nostrils 2 (two) times daily. Use in each nostril as directed [name], MD Taking Active   Calcium Carb-Ergocalciferol  500-200 MG-UNIT TABS [id number] No Take 1 tablet by mouth daily. [provider] Taking Active   carbidopa-levodopa (SINEMET IR) 25-100 MG tablet [id number] No Take 1 tablet by mouth 3 (three) times daily. [provider] Taking Active   celecoxib  (CELEBREX ) 200 MG capsule [id number] No TAKE 1 CAPSULE TWICE DAILY [name], MD Taking Active   colchicine  (COLCRYS ) 0.6 MG tablet [id number] No Take 1 tablet (0.6 mg total) by mouth 2 (two) times daily. [name], MD Taking Active   fexofenadine (ALLEGRA) 60 MG tablet [id number] No Take 60 mg by mouth 2 (two) times daily. [provider] Taking Active   FIBER PO [id number] No Take 1 capsule by mouth in the morning and at bedtime. [provider] Taking Active   fluticasone (FLONASE) 50 MCG/ACT nasal spray [id number] No Place 1 spray into both nostrils daily. [provider] Taking Active            Med Note VASHTI, MARGARIE D   Wed May 22, 2019  1:30 PM)    glucose blood (TRUE METRIX BLOOD GLUCOSE TEST) test strip [id number] No TEST BLOOD SUGAR ONCE EVERY DAY [name], MD Taking Active   Lancet Devices (TRUEDRAW LANCING DEVICE) MISC  [id number] No USE AS DIRECTED [name], MD Taking Active   Lancets Misc. (ACCU-CHEK SOFTCLIX LANCET DEV) KIT [id number] No 1 each by Does not apply route daily. [name], MD Taking Active   losartan  (COZAAR ) 100 MG tablet [id number]  TAKE 1 TABLET EVERY DAY Berglund, Laura H, MD  Active   metFORMIN  (GLUCOPHAGE -XR) 500 MG 24 hr tablet [id number]  Take 1 tablet (500 mg total) by mouth daily with breakfast. [name], MD  Active   methocarbamol  (ROBAXIN ) 500 MG tablet [id number] No Take 1 tablet (500 mg total) by mouth at bedtime as needed for muscle spasms. This can make you sleepy. Hilma Hastings, PA-C Taking Active   metoprolol  succinate (TOPROL -XL) 50 MG 24 hr tablet [id number]  TAKE 3 TABLETS DAILY WITH OR IMMEDIATELY FOLLOWING A MEAL Berglund, Laura H, MD  Active   mupirocin  ointment (BACTROBAN ) 2 % [id number] No PLACE 1 APPLICATION INTO THE NOSE 2 TIMES A DAILY. [name], MD Taking Active   omeprazole  [facility]) 20 MG capsule [id number]  TAKE 1 CAPSULE EVERY DAY Berglund, Laura H, MD  Active   simvastatin  (ZOCOR ) 40 MG tablet [id number] No TAKE 1 TABLET AT BEDTIME [name], MD Taking Active   traMADol  (ULTRAM ) 50 MG tablet [id number]  TAKE 1 TABLET BY MOUTH EVERY 12 HOURS AS NEEDED [name], MD  Active   triamcinolone  lotion (KENALOG ) 0.1 % [id number] No Apply 1 Application topically 3 (three) times daily. To scalp rash [name], MD Taking Active   TRUEplus Lancets 30G MISC [id number] No 1 each by Does not apply route 2 (two) times daily. [name], MD Taking Active   vitamin C (ASCORBIC ACID) 500 MG tablet [id number] No Take 500 mg by mouth daily. [provider] Taking Active   Vitamin D , Ergocalciferol , (DRISDOL ) 1.25 MG (50000 UNIT) CAPS capsule [id number] No Take 1 capsule (50,000 Units total) by mouth every 7 (seven) days. Take for 8 total doses(weeks) Alvia Selinda PARAS, MD Taking Active            Home Care and  Equipment/Supplies: Were Home Health Services Ordered?: No Any new equipment or medical supplies ordered?: No  Functional Questionnaire: Do you need assistance with bathing/showering or dressing?: No Do you need assistance with meal preparation?: No Do you need assistance with eating?: No Do you have difficulty maintaining continence: No Do you need assistance with getting out of bed/getting out of a chair/moving?: No Do you have difficulty managing or taking your medications?: No  Follow up appointments reviewed: PCP Follow-up appointment confirmed?: Yes (care coordination outreach in real-time with scheduling care guide to successfully schedule hospital follow up PCP appointment 01/09/24) Date of PCP follow-up appointment?: 01/09/24 Follow-up Provider: PCP- Dr. Justus Specialist [facility] Follow-up appointment confirmed?: NA (verified not indicated per hospital discharging provider discharge notes) Do you need transportation to your follow-up appointment?: No Do you understand care options if your condition(s) worsen?: Yes-patient verbalized understanding  SDOH Interventions Today    Flowsheet Row Most Recent Value  SDOH Interventions   Food Insecurity Interventions Patient Declined  Housing Interventions Patient Declined  Transportation Interventions Intervention Not Indicated  [drives self at baseline,  spouse assisting with transportation needs post- recent hospital discharge]  Utilities Interventions  Patient Declined   Patient declines need for ongoing/ further care management/ coordination outreach; declines enrollment in 30-day TOC program- declines taking my direct phone number should needs/ concerns arise post-TOC call   See TOC assessment tabs for additional assessment/ TOC intervention information  Pls call/ message for questions,  Joycie Aerts Mckinney Verlie Hellenbrand, RN, BSN, CCRN Alumnus RN Care Manager  Transitions of Care  [facility] [phone]: direct office

## 2024-01-03 NOTE — Telephone Encounter (Signed)
 Patient prefers mail order for this Rx- remainder of Rx forwarded.  Requested Prescriptions  Pending Prescriptions Disp Refills   metFORMIN  (GLUCOPHAGE -XR) 500 MG 24 hr tablet [Pharmacy Med Name: metFORMIN  HCl ER Oral Tablet Extended Release 24 Hour 500 MG] 90 tablet 2    Sig: TAKE 1 TABLET EVERY DAY WITH BREAKFAST     Endocrinology:  Diabetes - Biguanides Failed - 01/03/2024 11:04 AM      Failed - B12 Level in normal range and within 720 days    Vitamin B-12  Date Value Ref Range Status  07/23/2021 1,432 (H) 232 - 1,245 pg/mL Final         Failed - CBC within normal limits and completed in the last 12 months    WBC  Date Value Ref Range Status  06/04/2023 10.1  Final  03/15/2022 8.3 4.0 - 10.5 K/uL Final   RBC  Date Value Ref Range Status  03/15/2022 3.27 (L) 3.87 - 5.11 MIL/uL Final   Hemoglobin  Date Value Ref Range Status  06/04/2023 10.5 (A) 12.0 - 16.0 Final  07/23/2021 9.7 (L) 11.1 - 15.9 g/dL Final   HCT  Date Value Ref Range Status  06/04/2023 33 (A) 36 - 46 Final   Hematocrit  Date Value Ref Range Status  07/23/2021 28.6 (L) 34.0 - 46.6 % Final   MCHC  Date Value Ref Range Status  03/15/2022 32.5 30.0 - 36.0 g/dL Final   [facility]  Date Value Ref Range Status  03/15/2022 31.8 26.0 - 34.0 pg Final   MCV  Date Value Ref Range Status  03/15/2022 97.9 80.0 - 100.0 fL Final  07/23/2021 96 79 - 97 fL Final   No results found for: PLTCOUNTKUC, LABPLAT, POCPLA RDW  Date Value Ref Range Status  03/15/2022 20.6 (H) 11.5 - 15.5 % Final  07/23/2021 20.2 (H) 11.7 - 15.4 % Final         Passed - Cr in normal range and within 360 days    Creatinine, Ser  Date Value Ref Range Status  08/11/2023 0.98 0.57 - 1.00 mg/dL Final         Passed - HBA1C is between 0 and 7.9 and within 180 days    Hgb A1c MFr Bld  Date Value Ref Range Status  08/11/2023 6.2 (H) 4.8 - 5.6 % Final    Comment:             Prediabetes: 5.7 - 6.4          Diabetes: >6.4           Glycemic control for adults with diabetes: <7.0          Passed - eGFR in normal range and within 360 days    GFR calc Af Amer  Date Value Ref Range Status  04/07/2020 60 >59 mL/min/1.73 Final    Comment:    **[facility] currently reports eGFR in compliance with the current**   recommendations of the [employer]. [facility] will   update reporting as new guidelines are published from the NKF-ASN   Task force.    GFR, Estimated  Date Value Ref Range Status  03/15/2022 52 (L) >60 mL/min Final    Comment:    (NOTE) Calculated using the CKD-EPI Creatinine Equation (2021)    eGFR  Date Value Ref Range Status  08/11/2023 62 >59 mL/min/1.73 Final         Passed - Valid encounter within last 6 months    Recent Outpatient Visits  4 months ago Essential (primary) hypertension   [facility], [name], MD

## 2024-01-09 ENCOUNTER — Encounter: Payer: Self-pay | Admitting: Internal Medicine

## 2024-01-09 ENCOUNTER — Ambulatory Visit: Admitting: Internal Medicine

## 2024-01-09 VITALS — BP 118/74 | HR 74 | Ht 66.0 in | Wt 183.0 lb

## 2024-01-09 DIAGNOSIS — L03116 Cellulitis of left lower limb: Secondary | ICD-10-CM | POA: Diagnosis not present

## 2024-01-09 DIAGNOSIS — I10 Essential (primary) hypertension: Secondary | ICD-10-CM

## 2024-01-09 DIAGNOSIS — Z7984 Long term (current) use of oral hypoglycemic drugs: Secondary | ICD-10-CM

## 2024-01-09 DIAGNOSIS — E118 Type 2 diabetes mellitus with unspecified complications: Secondary | ICD-10-CM

## 2024-01-09 DIAGNOSIS — I82401 Acute embolism and thrombosis of unspecified deep veins of right lower extremity: Secondary | ICD-10-CM | POA: Diagnosis not present

## 2024-01-09 DIAGNOSIS — M5116 Intervertebral disc disorders with radiculopathy, lumbar region: Secondary | ICD-10-CM

## 2024-01-09 MED ORDER — TRAMADOL HCL 50 MG PO TABS: 50.0000 mg | ORAL_TABLET | Freq: Two times a day (BID) | ORAL | 3 refills | Status: AC | PRN

## 2024-01-09 NOTE — Patient Instructions (Addendum)
 Call [facility] to schedule your mammogram at [phone].  Schedule your diabetic Eye exam.

## 2024-01-09 NOTE — Assessment & Plan Note (Signed)
 Blood sugars have been stable.  No recent hypoglycemic events requiring assistance. Currently medications are MTF. Lab Results  Component Value Date   HGBA1C 6.3 12/26/2023   Last visit no changes were made.

## 2024-01-09 NOTE — Assessment & Plan Note (Signed)
 Blood pressure is well controlled.  Current medications are metoprolol  and losartan . Will continue same regimen along with efforts to limit dietary sodium.

## 2024-01-09 NOTE — Progress Notes (Signed)
 Date:  01/09/2024   Name:  Kiara Campbell   DOB:  May 08, 1956   MRN:  [id number]   Chief Complaint: Hospitalization Follow-up Hospital follow up from [name].  Admitted  12/25/23 to 01/01/24.  TOC call on 01/02/24.   Discharge Diagnoses: Principal Problem: Insect bite of left foot (POA: Yes) Active Problems: Hypertension (POA: Yes) GERD (gastroesophageal reflux disease) (POA: Yes) Type 2 diabetes mellitus (POA: Yes) Resolved Problems: * No resolved hospital problems. *  Outpatient Provider Follow Up Issues:   Hospital Course:  Kiara Campbell is a 68 year-old woman with DM II on metformin  (A1C 6 08/2023), Parkinson's, HTN, hysterectomy in 1997 2/2 fibroids, rectopexy 2008 for anal incontinence, h/o diverticulitis, gout, GERD, prior COVID-19 infection, who presented with sudden left foot pain and inability to bear weight. Stated that yesterday she thought she had an insect bite on the dorsum of her foot and immediate bruising but was unsure what had bitten her. No fevers or chills. The patient on admission had a WBC of 11.8. There was concern for a potentially dangerous insect bite such as that from a black widow. However, skin findings did not suggest this and there was no evidence of muscle paralysis. CK was within normal limits. Poison control was called and recommended supportive care. A CT LLE was obtained and it did not show an organized fluid collection or abscess. Reassuringly, CRP was less than 5 as well. Findings did seem c/w cellulitis clinically and her CT did show soft tissue thickening consistent with that. She was started on IV vancomycin and ceftriaxone . Since it was a non-purulent SSTI, a MRSA screen was obtained and it was negative and vancomycin was discontinued. She was continued on ceftriaxone  (start date 12/26/2023) and had slow improvement of her LLE cellulitis. However, was able to bear weight and use a walker on 12/28/2023. The patient was asked about the [name])  program and she expressed interest and on 12/28/2023, was sent home with [name]. Redness and pain worsened after stopping vancomycin so coverage was broadened to cefepime on 6/27. Redness, swelling, and pain significantly improved over the following 2 days and she was transitioned to oral levofloxacin (750 mg daily) on 6/29 to continue pseudomonas coverage given improvement with cefepime.    >>>  she now says that she found a copperhead snake skin in her house after she was admitted.   Of note, the patient stated about 6 months ago, she had a basal cell carcinoma removed from her left lower extremity and she developed cellulitis after that and had multiple round of antibiotics. Don't see records in care everywhere regarding this. In any event, is not immunocompromised and is not a poorly-controlled diabetic so did not empirically cover for Pseudomonas.   For her LLE swelling, b/l PVLs venous were obtained and showed a right peroneal and soleal vein DVT. However, no symptoms at all in her RLE but due to the fact she reported a prior DVT about 10-15 years ago, it was decided to treat her with xarelto. PPI dose for GERD increased to 40 mg po daily.    >>> will treat DVT for 3 mo since provoked.  No bleeding issues noted.   Was last admitted from 08/22/2022 to 08/26/2022 at [name] hospital to the surgery service for an SBO. She was treated conservatively with an NGT and symptoms eventually resolved. Did not have issues with abdominal pain/discomfort while admitted this time around prior to transition to [name].   For  DM II, was on metformin  monotherapy at home. Hemoglobin A1C 08/2023 was 6. Repeat hemoglobin A1C here was 6.3. Managed well with SSI while admitted. Given concurrent obesity, a GLP-1 agonist, if covered by insurance, would be ideal. Metformin  was resumed with [name] prior to discharge.   Essential HTN: continued on home losartan  and metoprolol  succinate. If BP is suboptimal on this regimen, would recommend  switching the latter to carvedilol since for whatever reason, is on BID dosing of metoprolol  succinate anyway. Or can also consider hydrochlorothiazide. Can defer this to her outpatient providers to decide.   History of c. Diff: this was 08/2020. Monitor for severe diarrhea while on antibiotics. Would consider fidaxomicin if she has recurrent c. Diff as her first episode was treated with po vancomycin (po fidaxomicin is considered first-line per IDSA guidelines for first episode too now anyway).   Gout: was continued on home allopurinol . Initial concern existed that her pain and swelling over the dorsum of her foot could be related to gout but had no relief with steroids going against an acute gout flare. Encourage weight loss, consider GLP-1 agonist outpatient.   Parkinson's disease: was continued on her home carbidopa-levodopa. She has an appointment with Neurology 01/15/2024 in [name].   Medication List   START taking these medications  levoFLOXacin 750 MG tablet; Commonly known as: LEVAQUIN; Take 1 tablet  (750 mg total) by mouth daily for 3 days.; Start taking on: January 02, 2024 ondansetron  4 MG tablet; Commonly known as: ZOFRAN ; Take 1 tablet (4 mg  total) by mouth daily as needed for nausea for up to 20 doses. * rivaroxaban 15 mg Tab; Commonly known as: XARELTO; Take 1 tablet (15  mg total) by mouth in the morning and 1 tablet (15 mg total) in the  evening. Take with meals. Do all this for 30 doses. Then take 20 mg once  daily until told to stop by your PCP.; Start taking on: January 02, 2024 * rivaroxaban 20 mg tablet; Commonly known as: XARELTO; Take 1 tablet  (20 mg total) by mouth daily with evening meal. On 7/17 start rivaroxaban  20 mg once daily with food; Start taking on: January 18, 2024 * This list has 2 medication(s) that are the same as other medications  prescribed for you. Read the directions carefully, and ask your doctor or  other care provider to review them with  you.  CONTINUE taking these medications  albuterol  90 mcg/actuation inhaler; Commonly known as: PROVENTIL   HFA;VENTOLIN  HFA allopurinol  300 MG tablet; Commonly known as: ZYLOPRIM  ascorbic acid (vitamin C) 500 MG tablet; Commonly known as: VITAMIN C azelastine  137 mcg (0.1 %) nasal spray; Commonly known as: ASTELIN  CALCIUM ORAL carbidopa-levodopa 25-100 mg per tablet; Commonly known as: SINEMET cholecalciferol (vitamin D3-125 mcg (5,000 unit)) 125 mcg (5,000 unit)  tablet colchicine  0.6 mg tablet; Commonly known as: COLCRYS  fexofenadine 60 MG tablet; Commonly known as: ALLEGRA FIBER (DEXTRIN) 3 gram/3.5 gram Powd; Generic drug: dextrin fluticasone propionate 50 mcg/actuation nasal spray; Commonly known as:  FLONASE losartan  100 MG tablet; Commonly known as: COZAAR  magnesium oxide 250 mg (150 mg elemental) tablet metFORMIN  500 MG (MOD) 24 hr tablet; Commonly known as: GLUMETZA  metoPROLOL  succinate 50 MG 24 hr tablet; Commonly known as: Toprol -XL MULTIVITAMIN ORAL omeprazole  20 MG capsule; Commonly known as: PriLOSEC simvastatin  40 MG tablet; Commonly known as: ZOCOR  traMADol  50 mg tablet; Commonly known as: ULTRAM   STOP taking these medications  celecoxib  200 MG capsule; Commonly known as: CeleBREX    Review of  Systems  Constitutional:  Negative for chills, fatigue and fever.  Respiratory:  Positive for shortness of breath. Negative for chest tightness and wheezing.   Cardiovascular:  Positive for leg swelling. Negative for chest pain and palpitations.  Musculoskeletal:  Positive for arthralgias, back pain and myalgias.  Psychiatric/Behavioral:  Negative for dysphoric mood and sleep disturbance. The patient is not nervous/anxious.      Lab Results  Component Value Date   NA 142 12/29/2023   K 4.5 12/29/2023   CO2 30 (A) 12/29/2023   GLUCOSE 115 (H) 08/11/2023   BUN 17 12/29/2023   CREATININE 1.0 12/29/2023   CALCIUM 9.7 12/29/2023   EGFR 63 12/29/2023   GFRNONAA 52 (L)  03/15/2022   Lab Results  Component Value Date   CHOL 136 03/30/2023   HDL 48 03/30/2023   LDLCALC 69 03/30/2023   TRIG 106 03/30/2023   CHOLHDL 2.8 03/30/2023   Lab Results  Component Value Date   TSH 1.797 03/15/2022   Lab Results  Component Value Date   HGBA1C 6.3 12/26/2023   Lab Results  Component Value Date   WBC 10.5 12/31/2023   HGB 10.3 (A) 12/31/2023   HCT 31 (A) 12/31/2023   MCV 97.9 03/15/2022   PLT 256 12/31/2023   Lab Results  Component Value Date   ALT 14 12/29/2023   AST 20 12/29/2023   ALKPHOS 83 12/29/2023   BILITOT 0.5 03/30/2023   Lab Results  Component Value Date   VD25OH 48.8 09/05/2022     Patient Active Problem List   Diagnosis Date Noted   Parkinson's disease (HCC) 08/11/2023   Shortness of breath 03/30/2023   Stage 3a chronic kidney disease (HCC) 09/05/2022   Primary osteoarthritis of left knee 03/22/2022   Displaced fracture of fifth metatarsal bone, left foot, initial encounter for closed fracture 08/11/2021   Anorectal ulcer 07/23/2021   Esophageal dysphagia    Schatzki's ring    Gastric erythema    Hiatal hernia    Hx of skin cancer, basal cell 11/19/2018   Tobacco use disorder, moderate, in sustained remission 08/09/2017   Obesity (BMI 30-39.9) 07/17/2017   GERD (gastroesophageal reflux disease) 07/12/2017   Type II diabetes mellitus with complication (HCC) 06/02/2015   Fibrocystic breast disease 12/03/2014   Colon polyp 12/03/2014   Essential (primary) hypertension 12/03/2014   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   OP (osteoporosis) 12/03/2014   Hyperlipidemia associated with type 2 diabetes mellitus (HCC) 12/03/2014    Allergies  Allergen Reactions   Glipizide Nausea Only    Past Surgical History:  Procedure Laterality Date   ABDOMINAL HYSTERECTOMY     partial   ABDOMINAL HYSTERECTOMY     APPENDECTOMY     BREAST CYST ASPIRATION     neg not sure which side   COLONOSCOPY WITH PROPOFOL  N/A  02/03/2021   Procedure: COLONOSCOPY WITH PROPOFOL ;  Surgeon: Janalyn Keene NOVAK, MD;  Location: [facility] ENDOSCOPY;  Service: Endoscopy;  Laterality: N/A;   ESOPHAGOGASTRODUODENOSCOPY N/A 02/03/2021   Procedure: ESOPHAGOGASTRODUODENOSCOPY (EGD);  Surgeon: Janalyn Keene NOVAK, MD;  Location: [facility] END[facility]COPY;  Service: Endoscopy;  Laterality: N/A;   JOINT REPLACEMENT     LESION DESTRUCTION N/A 08/18/2021   Procedure: BIOPSY OF ANORECTAL ULCER;  Surgeon: Teresa Lonni HERO, MD;  Location: [facility];  Service: General;  Laterality: N/A;   RECTAL PROLAPSE REPAIR     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   TONSILLECTOMY     VARICOSE VEIN SURGERY  Social History   Tobacco Use   Smoking status: Former    Current packs/day: 0.00    Average packs/day: 0.5 packs/day for 30.0 years (15.0 ttl pk-yrs)    Types: Cigarettes    Start date: 08/04/1988    Quit date: 08/04/2018    Years since quitting: 5.4   Smokeless tobacco: Never   Tobacco comments:    Patient declines Lung Cancer Screening.  Vaping Use   Vaping status: Never Used  Substance Use Topics   Alcohol use: Yes    Comment: rarely   Drug use: Never     Medication list has been reviewed and updated.  Current Meds  Medication Sig   albuterol  (VENTOLIN  HFA) 108 (90 Base) MCG/ACT inhaler Inhale 2 puffs into the lungs every 6 (six) hours as needed for wheezing or shortness of breath.   Alcohol Swabs (B-D SINGLE USE SWABS REGULAR) PADS USE DAILY.   allopurinol  (ZYLOPRIM ) 300 MG tablet TAKE 1 TABLET EVERY DAY   azelastine  (ASTELIN ) 0.1 % nasal spray Place 1 spray into both nostrils 2 (two) times daily. Use in each nostril as directed   Calcium Carb-Ergocalciferol  500-200 MG-UNIT TABS Take 1 tablet by mouth daily.   carbidopa-levodopa (SINEMET IR) 25-100 MG tablet Take 1 tablet by mouth 3 (three) times daily.   celecoxib  (CELEBREX ) 200 MG capsule TAKE 1 CAPSULE TWICE DAILY   colchicine  (COLCRYS ) 0.6 MG tablet Take 1 tablet  (0.6 mg total) by mouth 2 (two) times daily.   fexofenadine (ALLEGRA) 60 MG tablet Take 60 mg by mouth 2 (two) times daily.   FIBER PO Take 1 capsule by mouth in the morning and at bedtime.   fluticasone (FLONASE) 50 MCG/ACT nasal spray Place 1 spray into both nostrils daily.   glucose blood (TRUE METRIX BLOOD GLUCOSE TEST) test strip TEST BLOOD SUGAR ONCE EVERY DAY   Lancet Devices (TRUEDRAW LANCING DEVICE) MISC USE AS DIRECTED   Lancets Misc. (ACCU-CHEK SOFTCLIX LANCET DEV) KIT 1 each by Does not apply route daily.   losartan  (COZAAR ) 100 MG tablet TAKE 1 TABLET EVERY DAY   metFORMIN  (GLUCOPHAGE -XR) 500 MG 24 hr tablet TAKE 1 TABLET EVERY DAY WITH BREAKFAST   methocarbamol  (ROBAXIN ) 500 MG tablet Take 1 tablet (500 mg total) by mouth at bedtime as needed for muscle spasms. This can make you sleepy.   metoprolol  succinate (TOPROL -XL) 50 MG 24 hr tablet TAKE 3 TABLETS DAILY WITH OR IMMEDIATELY FOLLOWING A MEAL   mupirocin  ointment (BACTROBAN ) 2 % PLACE 1 APPLICATION INTO THE NOSE 2 TIMES A DAILY.   omeprazole  (PRILOSEC) 20 MG capsule TAKE 1 CAPSULE EVERY DAY   ondansetron  (ZOFRAN ) 4 MG tablet Take 4 mg by mouth every 8 (eight) hours as needed.   Rivaroxaban (XARELTO) 15 MG TABS tablet Take 15 mg by mouth 2 (two) times daily with a meal.   [START ON 01/18/2024] rivaroxaban (XARELTO) 20 MG TABS tablet Take 20 mg by mouth daily with supper.   simvastatin  (ZOCOR ) 40 MG tablet TAKE 1 TABLET AT BEDTIME   triamcinolone  lotion (KENALOG ) 0.1 % Apply 1 Application topically 3 (three) times daily. To scalp rash   TRUEplus Lancets 30G MISC 1 each by Does not apply route 2 (two) times daily.   vitamin C (ASCORBIC ACID) 500 MG tablet Take 500 mg by mouth daily.   Vitamin D , Ergocalciferol , (DRISDOL ) 1.25 MG (50000 UNIT) CAPS capsule Take 1 capsule (50,000 Units total) by mouth every 7 (seven) days. Take for 8 total doses(weeks)   [DISCONTINUED]  traMADol  (ULTRAM ) 50 MG tablet TAKE 1 TABLET BY MOUTH EVERY 12  HOURS AS NEEDED       01/09/2024    2:06 PM 08/11/2023    3:15 PM 03/30/2023   10:17 AM 12/02/2022    1:30 PM  GAD 7 : Generalized Anxiety Score  Nervous, Anxious, on Edge 0 0 1 1  Control/stop worrying 0 0 1 0  Worry too much - different things 0 0 1 0  Trouble relaxing 0 0 1 1  Restless 0 0 1 0  Easily annoyed or irritable 0 0 1 0  Afraid - awful might happen 0 0 1 1  Total GAD 7 Score 0 0 7 3  Anxiety Difficulty Not difficult at all Not difficult at all Somewhat difficult Not difficult at all       01/09/2024    2:06 PM 08/11/2023    3:15 PM 03/30/2023   10:16 AM  Depression screen PHQ 2/9  Decreased Interest 0 0 1  Down, Depressed, Hopeless 0 0 1  PHQ - 2 Score 0 0 2  Altered sleeping 1  3  Tired, decreased energy 1  3  Change in appetite 1  2  Feeling bad or failure about yourself  0  1  Trouble concentrating 0  1  Moving slowly or fidgety/restless 0  1  Suicidal thoughts 0  0  PHQ-9 Score 3  13  Difficult doing work/chores Not difficult at all  Not difficult at all    BP Readings from Last 3 Encounters:  01/09/24 118/74  08/11/23 126/74  05/22/23 (!) 192/78    Physical Exam Vitals and nursing note reviewed.  Constitutional:      General: She is not in acute distress.    Appearance: She is well-developed.  HENT:     Head: Normocephalic and atraumatic.  Cardiovascular:     Rate and Rhythm: Normal rate and regular rhythm.     Heart sounds: No murmur heard. Pulmonary:     Effort: Pulmonary effort is normal. No respiratory distress.     Breath sounds: No wheezing or rhonchi.  Musculoskeletal:        General: Swelling (dorsum of left foot) present.     Cervical back: Normal range of motion.     Right lower leg: No edema.     Left lower leg: No edema.  Skin:    General: Skin is warm and dry.     Capillary Refill: Capillary refill takes less than 2 seconds.     Findings: No rash.  Neurological:     General: No focal deficit present.     Mental Status: She is  alert and oriented to person, place, and time.  Psychiatric:        Mood and Affect: Mood normal.        Behavior: Behavior normal.     Wt Readings from Last 3 Encounters:  01/09/24 183 lb (83 kg)  08/11/23 183 lb (83 kg)  05/22/23 176 lb (79.8 kg)    BP 118/74   Pulse 74   Ht 5' 6 (1.676 m)   Wt 183 lb (83 kg)   LMP 09/27/1995   SpO2 97%   BMI 29.54 kg/m   Assessment and Plan:  Problem List Items Addressed This Visit       Unprioritized   Essential (primary) hypertension (Chronic)   Blood pressure is well controlled.  Current medications are metoprolol  and losartan . Will continue same regimen along with efforts to  limit dietary sodium.       Relevant Medications   rivaroxaban (XARELTO) 20 MG TABS tablet (Start on 01/18/2024)   Rivaroxaban (XARELTO) 15 MG TABS tablet   Type II diabetes mellitus with complication (HCC) (Chronic)   Blood sugars have been stable.  No recent hypoglycemic events requiring assistance. Currently medications are MTF. Lab Results  Component Value Date   HGBA1C 6.3 12/26/2023   Last visit no changes were made.       Lumbar disc herniation with radiculopathy   Relevant Medications   traMADol  (ULTRAM ) 50 MG tablet   Other Visit Diagnoses       Cellulitis of left lower extremity    -  Primary     Leg DVT (deep venous thromboembolism), acute, right (HCC)       recommend 3 mo of therapy   Relevant Medications   rivaroxaban (XARELTO) 20 MG TABS tablet (Start on 01/18/2024)   Rivaroxaban (XARELTO) 15 MG TABS tablet     Long term current use of oral hypoglycemic drug           Return in about 4 months (around 05/11/2024) for DM, HTN.    Leita HILARIO Adie, MD [facility] [place]

## 2024-02-07 ENCOUNTER — Ambulatory Visit

## 2024-02-07 VITALS — Ht 66.0 in | Wt 175.0 lb

## 2024-02-07 DIAGNOSIS — Z0000 Encounter for general adult medical examination without abnormal findings: Secondary | ICD-10-CM | POA: Diagnosis not present

## 2024-02-07 NOTE — Patient Instructions (Addendum)
 Kiara Campbell , Thank you for taking time out of your busy schedule to complete your Annual Wellness Visit with me. I enjoyed our conversation and look forward to speaking with you again next year. I, as well as your care team,  appreciate your ongoing commitment to your health goals. Please review the following plan we discussed and let me know if I can assist you in the future. Your Game plan/ To Do List    Referrals: None  Follow up Visits: We will see or speak with you next year for your Next Medicare AWV with our clinical staff. Patient declined scheduling AWV at this time. Have you seen your provider in the last 6 months (3 months if uncontrolled diabetes)? Yes  Clinician Recommendations: Call to schedule a diabetic eye exam at your earliest convenience. Get the flu vaccine in the fall. I have made you an appointment to see Dr. Harlene Saddler on 02/09/24 @ 1:20pm.  Aim for 30 minutes of exercise or brisk walking, 6-8 glasses of water, and 5 servings of fruits and vegetables each day.   Please call to schedule your mammogram:  [facility] Address: [address], [place], [place] Phone: [phone]         This is a list of the screenings recommended for you:  Health Maintenance  Topic Date Due   Mammogram  04/14/2022   Eye exam for diabetics  09/06/2023   COVID-19 Vaccine (6 - Moderna risk 2024-25 season) 09/21/2023   Flu Shot  02/02/2024   Hemoglobin A1C  06/26/2024   Yearly kidney health urinalysis for diabetes  08/10/2024   Complete foot exam   08/10/2024   Yearly kidney function blood test for diabetes  12/28/2024   Medicare Annual Wellness Visit  02/06/2025   DEXA scan (bone density measurement)  10/27/2027   Colon Cancer Screening  02/04/2028   DTaP/Tdap/Td vaccine (2 - Td or Tdap) 10/07/2031   Pneumococcal Vaccine for age over 31  Completed   Zoster (Shingles) Vaccine  Completed   Hepatitis C Screening  Addressed    Hepatitis B Vaccine  Aged Out   HPV Vaccine  Aged Out   Meningitis B Vaccine  Aged Out    Advanced directives: (Declined) Advance directive discussed with you today. Even though you declined this today, please call our office should you change your mind, and we can give you the proper paperwork for you to fill out. Advance Care Planning is important because it:  [x]  Makes sure you receive the medical care that is consistent with your values, goals, and preferences  [x]  It provides guidance to your family and loved ones and reduces their decisional burden about whether or not they are making the right decisions based on your wishes.  Follow the link provided in your after visit summary or read over the paperwork we have mailed to you to help you started getting your Advance Directives in place. If you need assistance in completing these, please reach out to us  so that we can help you!  See attachments for Preventive Care and Fall Prevention Tips.   Fall Prevention in the Home, Adult Falls can cause injuries and affect people of all ages. There are many simple things that you can do to make your home safe and to help prevent falls. If you need it, ask for help making these changes. What actions can I take to prevent falls? General information Use good lighting in all rooms. Make  sure to: Replace any light bulbs that burn out. Turn on lights if it is dark and use night-lights. Keep items that you use often in easy-to-reach places. Lower the shelves around your home if needed. Move furniture so that there are clear paths around it. Do not keep throw rugs or other things on the floor that can make you trip. If any of your floors are uneven, fix them. Add color or contrast paint or tape to clearly mark and help you see: Grab bars or handrails. First and last steps of staircases. Where the edge of each step is. If you use a ladder or stepladder: Make sure that it is fully opened. Do not  climb a closed ladder. Make sure the sides of the ladder are locked in place. Have someone hold the ladder while you use it. Know where your pets are as you move through your home. What can I do in the bathroom?     Keep the floor dry. Clean up any water that is on the floor right away. Remove soap buildup in the bathtub or shower. Buildup makes bathtubs and showers slippery. Use non-skid mats or decals on the floor of the bathtub or shower. Attach bath mats securely with double-sided, non-slip rug tape. If you need to sit down while you are in the shower, use a non-slip stool. Install grab bars by the toilet and in the bathtub and shower. Do not use towel bars as grab bars. What can I do in the bedroom? Make sure that you have a light by your bed that is easy to reach. Do not use any sheets or blankets on your bed that hang to the floor. Have a firm bench or chair with side arms that you can use for support when you get dressed. What can I do in the kitchen? Clean up any spills right away. If you need to reach something above you, use a sturdy step stool that has a grab bar. Keep electrical cables out of the way. Do not use floor polish or wax that makes floors slippery. What can I do with my stairs? Do not leave anything on the stairs. Make sure that you have a light switch at the top and the bottom of the stairs. Have them installed if you do not have them. Make sure that there are handrails on both sides of the stairs. Fix handrails that are broken or loose. Make sure that handrails are as long as the staircases. Install non-slip stair treads on all stairs in your home if they do not have carpet. Avoid having throw rugs at the top or bottom of stairs, or secure the rugs with carpet tape to prevent them from moving. Choose a carpet design that does not hide the edge of steps on the stairs. Make sure that carpet is firmly attached to the stairs. Fix any carpet that is loose or  worn. What can I do on the outside of my home? Use bright outdoor lighting. Repair the edges of walkways and driveways and fix any cracks. Clear paths of anything that can make you trip, such as tools or rocks. Add color or contrast paint or tape to clearly mark and help you see high doorway thresholds. Trim any bushes or trees on the main path into your home. Check that handrails are securely fastened and in good repair. Both sides of all steps should have handrails. Install guardrails along the edges of any raised decks or porches. Have leaves, snow, and  ice cleared regularly. Use sand, salt, or ice melt on walkways during winter months if you live where there is ice and snow. In the garage, clean up any spills right away, including grease or oil spills. What other actions can I take? Review your medicines with your health care provider. Some medicines can make you confused or feel dizzy. This can increase your chance of falling. Wear closed-toe shoes that fit well and support your feet. Wear shoes that have rubber soles and low heels. Use a cane, walker, scooter, or crutches that help you move around if needed. Talk with your provider about other ways that you can decrease your risk of falls. This may include seeing a physical therapist to learn to do exercises to improve movement and strength. Where to find more information Centers for Disease Control and Prevention, [employer]: TonerPromos.no [employer] on Aging: BaseRingTones.pl National Institute on Aging: BaseRingTones.pl Contact a health care provider if: You are afraid of falling at home. You feel weak, drowsy, or dizzy at home. You fall at home. Get help right away if you: Lose consciousness or have trouble moving after a fall. Have a fall that causes a head injury. These symptoms may be an emergency. Get help right away. Call 911. Do not wait to see if the symptoms will go away. Do not drive yourself to the hospital. This information is  not intended to replace advice given to you by your health care provider. Make sure you discuss any questions you have with your health care provider. Document Revised: 02/21/2022 Document Reviewed: 02/21/2022 Elsevier Patient Education  2024 Else[employer] Inc.   Managing Pain Without Opioids Opioids are strong medicines used to treat moderate to severe pain. For some people, especially those who have long-term (chronic) pain, opioids may not be the best choice for pain management due to: Side effects like nausea, constipation, and sleepiness. The risk of addiction (opioid use disorder). The longer you take opioids, the greater your risk of addiction. Pain that lasts for more than 3 months is called chronic pain. Managing chronic pain usually requires more than one approach and is often provided by a team of health care providers working together (multidisciplinary approach). Pain management may be done at a pain management center or pain clinic. How to manage pain without the use of opioids Use non-opioid medicines Non-opioid medicines for pain may include: Over-the-counter or prescription non-steroidal anti-inflammatory drugs (NSAIDs). These may be the first medicines used for pain. They work well for muscle and bone pain, and they reduce swelling. Acetaminophen . This over-the-counter medicine may work well for milder pain but not swelling. Antidepressants. These may be used to treat chronic pain. A certain type of antidepressant (tricyclics) is often used. These medicines are given in lower doses for pain than when used for depression. Anticonvulsants. These are usually used to treat seizures but may also reduce nerve (neuropathic) pain. Muscle relaxants. These relieve pain caused by sudden muscle tightening (spasms). You may also use a pain medicine that is applied to the skin as a patch, cream, or gel (topical analgesic), such as a numbing medicine. These may cause fewer side effects than  medicines taken by mouth. Do certain therapies as directed Some therapies can help with pain management. They include: Physical therapy. You will do exercises to gain strength and flexibility. A physical therapist may teach you exercises to move and stretch parts of your body that are weak, stiff, or painful. You can learn these exercises at physical therapy visits and  practice them at home. Physical therapy may also involve: Massage. Heat wraps or applying heat or cold to affected areas. Electrical signals that interrupt pain signals (transcutaneous electrical nerve stimulation, TENS). Weak lasers that reduce pain and swelling (low-level laser therapy). Signals from your body that help you learn to regulate pain (biofeedback). Occupational therapy. This helps you to learn ways to function at home and work with less pain. Recreational therapy. This involves trying new activities or hobbies, such as a physical activity or drawing. Mental health therapy, including: Cognitive behavioral therapy (CBT). This helps you learn coping skills for dealing with pain. Acceptance and commitment therapy (ACT) to change the way you think and react to pain. Relaxation therapies, including muscle relaxation exercises and mindfulness-based stress reduction. Pain management counseling. This may be individual, family, or group counseling.  Receive medical treatments Medical treatments for pain management include: Nerve block injections. These may include a pain blocker and anti-inflammatory medicines. You may have injections: Near the spine to relieve chronic back or neck pain. Into joints to relieve back or joint pain. Into nerve areas that supply a painful area to relieve body pain. Into muscles (trigger point injections) to relieve some painful muscle conditions. A medical device placed near your spine to help block pain signals and relieve nerve pain or chronic back pain (spinal cord stimulation  device). Acupuncture. Follow these instructions at home Medicines Take over-the-counter and prescription medicines only as told by your health care provider. If you are taking pain medicine, ask your health care providers about possible side effects to watch out for. Do not drive or use heavy machinery while taking prescription opioid pain medicine. Lifestyle  Do not use drugs or alcohol to reduce pain. If you drink alcohol, limit how much you have to: 0-1 drink a day for women who are not pregnant. 0-2 drinks a day for men. Know how much alcohol is in a drink. In the [place] one drink equals one 12 oz bottle of beer (355 mL), one 5 oz glass of wine (148 mL), or one 1 oz glass of hard liquor (44 mL). Do not use any products that contain nicotine or tobacco. These products include cigarettes, chewing tobacco, and vaping devices, such as e-cigarettes. If you need help quitting, ask your health care provider. Eat a healthy diet and maintain a healthy weight. Poor diet and excess weight may make pain worse. Eat foods that are high in fiber. These include fresh fruits and vegetables, whole grains, and beans. Limit foods that are high in fat and processed sugars, such as fried and sweet foods. Exercise regularly. Exercise lowers stress and may help relieve pain. Ask your health care provider what activities and exercises are safe for you. If your health care provider approves, join an exercise class that combines movement and stress reduction. Examples include yoga and tai chi. Get enough sleep. Lack of sleep may make pain worse. Lower stress as much as possible. Practice stress reduction techniques as told by your therapist. General instructions Work with all your pain management providers to find the treatments that work best for you. You are an important member of your pain management team. There are many things you can do to reduce pain on your own. Consider joining an online or in-person  support group for people who have chronic pain. Keep all follow-up visits. This is important. Where to find more information You can find more information about managing pain without opioids from: American Academy of Pain Medicine: painmed.org Institute  for Chronic Pain: instituteforchronicpain.org American Chronic Pain Association: theacpa.org Contact a health care provider if: You have side effects from pain medicine. Your pain gets worse or does not get better with treatments or home therapy. You are struggling with anxiety or depression. Summary Many types of pain can be managed without opioids. Chronic pain may respond better to pain management without opioids. Pain is best managed when you and a team of health care providers work together. Pain management without opioids may include non-opioid medicines, medical treatments, physical therapy, mental health therapy, and lifestyle changes. Tell your health care providers if your pain gets worse or is not being managed well enough. This information is not intended to replace advice given to you by your health care provider. Make sure you discuss any questions you have with your health care provider. Document Revised: 09/30/2020 Document Reviewed: 09/30/2020 Elsevier Patient Education  2024 [employer].

## 2024-02-07 NOTE — Progress Notes (Signed)
 Subjective:   Kiara Campbell is a 68 y.o. who presents for a Medicare Wellness preventive visit.  As a reminder, Annual Wellness Visits don't include a physical exam, and some assessments may be limited, especially if this visit is performed virtually. We may recommend an in-person follow-up visit with your provider if needed.  Visit Complete: Virtual I connected with  Kiara Campbell on 02/07/24 by a audio enabled telemedicine application and verified that I am speaking with the correct person using two identifiers.  Patient Location: Home  Provider Location: Home Office  I discussed the limitations of evaluation and management by telemedicine. The patient expressed understanding and agreed to proceed.  Vital Signs: Because this visit was a virtual/telehealth visit, some criteria may be missing or patient reported. Any vitals not documented were not able to be obtained and vitals that have been documented are patient reported.  VideoDeclined- This patient declined Librarian, academic. Therefore the visit was completed with audio only.  Persons Participating in Visit: Patient.  AWV Questionnaire: No: Patient Medicare AWV questionnaire was not completed prior to this visit.  Cardiac Risk Factors include: advanced age (>62men, >61 women);diabetes mellitus;dyslipidemia;hypertension     Objective:    Today's Vitals   02/07/24 1316  Weight: 175 lb (79.4 kg)  Height: 5' 6 (1.676 m)  PainSc: 8    Body mass index is 28.25 kg/m.     02/07/2024    1:33 PM 12/02/2022    1:33 PM 10/13/2021    3:00 PM 08/18/2021   12:06 PM 02/03/2021    8:29 AM 05/22/2019    1:35 PM 08/09/2018    1:41 PM  Advanced Directives  Does Patient Have a Medical Advance Directive? No No No No No No No   Would patient like information on creating a medical advance directive? No - Patient declined No - Patient declined No - Patient declined No - Patient declined  Yes  (MAU/Ambulatory/Procedural Areas - Information given)      Data saved with a previous flowsheet row definition    Current Medications (verified) Outpatient Encounter Medications as of 02/07/2024  Medication Sig   albuterol  (VENTOLIN  HFA) 108 (90 Base) MCG/ACT inhaler Inhale 2 puffs into the lungs every 6 (six) hours as needed for wheezing or shortness of breath.   Alcohol Swabs (B-D SINGLE USE SWABS REGULAR) PADS USE DAILY.   allopurinol  (ZYLOPRIM ) 300 MG tablet TAKE 1 TABLET EVERY DAY   azelastine  (ASTELIN ) 0.1 % nasal spray Place 1 spray into both nostrils 2 (two) times daily. Use in each nostril as directed (Patient taking differently: Place 1 spray into both nostrils 2 (two) times daily. Use in each nostril as directed PRN)   Calcium Carb-Ergocalciferol  500-200 MG-UNIT TABS Take 1 tablet by mouth daily. (Patient taking differently: Take 1 tablet by mouth daily. Twice a day)   carbidopa-levodopa (SINEMET IR) 25-100 MG tablet Take 1 tablet by mouth 3 (three) times daily.   colchicine  (COLCRYS ) 0.6 MG tablet Take 1 tablet (0.6 mg total) by mouth 2 (two) times daily. (Patient taking differently: Take 0.6 mg by mouth 2 (two) times daily. PRN for gout flare)   fexofenadine (ALLEGRA) 60 MG tablet Take 60 mg by mouth 2 (two) times daily. (Patient taking differently: Take 60 mg by mouth 2 (two) times daily. PRN)   FIBER PO Take 1 capsule by mouth in the morning and at bedtime.   fluticasone (FLONASE) 50 MCG/ACT nasal spray Place 1 spray into both nostrils daily. (  Patient taking differently: Place 1 spray into both nostrils daily. PRN)   glucose blood (TRUE METRIX BLOOD GLUCOSE TEST) test strip TEST BLOOD SUGAR ONCE EVERY DAY   Lancet Devices (TRUEDRAW LANCING DEVICE) MISC USE AS DIRECTED   Lancets Misc. (ACCU-CHEK SOFTCLIX LANCET DEV) KIT 1 each by Does not apply route daily.   losartan  (COZAAR ) 100 MG tablet TAKE 1 TABLET EVERY DAY   metFORMIN  (GLUCOPHAGE -XR) 500 MG 24 hr tablet TAKE 1 TABLET EVERY  DAY WITH BREAKFAST   metoprolol  succinate (TOPROL -XL) 50 MG 24 hr tablet TAKE 3 TABLETS DAILY WITH OR IMMEDIATELY FOLLOWING A MEAL   mupirocin  ointment (BACTROBAN ) 2 % PLACE 1 APPLICATION INTO THE NOSE 2 TIMES A DAILY.   omeprazole  (PRILOSEC) 20 MG capsule TAKE 1 CAPSULE EVERY DAY   ondansetron  (ZOFRAN ) 4 MG tablet Take 4 mg by mouth every 8 (eight) hours as needed.   rivaroxaban (XARELTO) 20 MG TABS tablet Take 20 mg by mouth daily with supper.   simvastatin  (ZOCOR ) 40 MG tablet TAKE 1 TABLET AT BEDTIME   traMADol  (ULTRAM ) 50 MG tablet Take 1 tablet (50 mg total) by mouth every 12 (twelve) hours as needed.   triamcinolone  lotion (KENALOG ) 0.1 % Apply 1 Application topically 3 (three) times daily. To scalp rash   TRUEplus Lancets 30G MISC 1 each by Does not apply route 2 (two) times daily.   Vitamin D , Ergocalciferol , (DRISDOL ) 1.25 MG (50000 UNIT) CAPS capsule Take 1 capsule (50,000 Units total) by mouth every 7 (seven) days. Take for 8 total doses(weeks)   celecoxib  (CELEBREX ) 200 MG capsule TAKE 1 CAPSULE TWICE DAILY (Patient not taking: Reported on 02/07/2024)   methocarbamol  (ROBAXIN ) 500 MG tablet Take 1 tablet (500 mg total) by mouth at bedtime as needed for muscle spasms. This can make you sleepy. (Patient not taking: Reported on 02/07/2024)   vitamin C (ASCORBIC ACID) 500 MG tablet Take 500 mg by mouth daily. (Patient not taking: Reported on 02/07/2024)   No facility-administered encounter medications on file as of 02/07/2024.    Allergies (verified) Glipizide   History: Past Medical History:  Diagnosis Date   Calcium blood increased 12/23/2013   Diabetes mellitus without complication (HCC)    Gout    Hyperlipidemia    Hypertension    Osteopenia    Past Surgical History:  Procedure Laterality Date   ABDOMINAL HYSTERECTOMY     partial   ABDOMINAL HYSTERECTOMY     APPENDECTOMY     BREAST CYST ASPIRATION     neg not sure which side   COLONOSCOPY WITH PROPOFOL  N/A 02/03/2021    Procedure: COLONOSCOPY WITH PROPOFOL ;  Surgeon: Janalyn Keene NOVAK, MD;  Location: [facility] ENDOSCOPY;  Service: Endoscopy;  Laterality: N/A;   ESOPHAGOGASTRODUODENOSCOPY N/A 02/03/2021   Procedure: ESOPHAGOGASTRODUODENOSCOPY (EGD);  Surgeon: Janalyn Keene NOVAK, MD;  Location: [facility] ENDOSCOPY;  Service: Endoscopy;  Laterality: N/A;   JOINT REPLACEMENT     LESION DESTRUCTION N/A 08/18/2021   Procedure: BIOPSY OF ANORECTAL ULCER;  Surgeon: Teresa Lonni HERO, MD;  Location: [facility];  Service: General;  Laterality: N/A;   RECTAL PROLAPSE REPAIR     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   TONSILLECTOMY     VARICOSE VEIN SURGERY     Family History  Problem Relation Age of Onset   COPD Mother    CAD Father        died age 39   Diabetes Maternal Grandmother    Breast cancer Neg Hx    Social  History   Socioeconomic History   Marital status: Married    Spouse name: Rutha Almond   Number of children: 2   Years of education: 16   Highest education level: Bachelor's degree (e.g., BA, AB, BS)  Occupational History   Occupation: Retired  Tobacco Use   Smoking status: Former    Current packs/day: 0.00    Average packs/day: 0.5 packs/day for 30.0 years (15.0 ttl pk-yrs)    Types: Cigarettes    Start date: 08/04/1988    Quit date: 08/04/2018    Years since quitting: 5.5   Smokeless tobacco: Never   Tobacco comments:    Patient declines Lung Cancer Screening.  Vaping Use   Vaping status: Never Used  Substance and Sexual Activity   Alcohol use: Not Currently    Comment: rarely   Drug use: Never   Sexual activity: Not Currently    Partners: Male  Other Topics Concern   Not on file  Social History Narrative   02-14-2024 1 living child and 1 deceased/pbt   Social Drivers of Health   Financial Resource Strain: Low Risk  (02-14-24)   Overall Financial Resource Strain (CARDIA)    Difficulty of Paying Living Expenses: Not hard at all  Food Insecurity: No Food Insecurity  (14-Feb-2024)   Hunger Vital Sign    Worried About Running Out of Food in the Last Year: Never true    Ran Out of Food in the Last Year: Never true  Transportation Needs: No Transportation Needs (02/14/2024)   PRAPARE - Administrator, Civil Service (Medical): No    Lack of Transportation (Non-Medical): No  Physical Activity: Inactive (2024/02/14)   Exercise Vital Sign    Days of Exercise per Week: 0 days    Minutes of Exercise per Session: 0 min  Stress: Stress Concern Present (14-Feb-2024)   [employer] of Occupational Health - Occupational Stress Questionnaire    Feeling of Stress: Rather much  Social Connections: Moderately Isolated (02/14/24)   Social Connection and Isolation Panel    Frequency of Communication with Friends and Family: Twice a week    Frequency of Social Gatherings with Friends and Family: More than three times a week    Attends Religious Services: Never    Database administrator or Organizations: No    Attends Banker Meetings: Never    Marital Status: Married    Tobacco Counseling Counseling given: Not Answered Tobacco comments: Patient declines Lung Cancer Screening.    Clinical Intake:  Pre-visit preparation completed: Yes  Pain : 0-10 Pain Score: 8  Pain Type: Chronic pain Pain Location: Leg Pain Orientation: Left Pain Descriptors / Indicators: Aching     BMI - recorded: 28.25 Nutritional Status: BMI 25 -29 Overweight Nutritional Risks: None Diabetes: Yes CBG done?: No Did pt. bring in CBG monitor from home?: No  Lab Results  Component Value Date   HGBA1C 6.3 12/26/2023   HGBA1C 6.2 (H) 08/11/2023   HGBA1C 6.3 (H) 03/30/2023     How often do you need to have someone help you when you read instructions, pamphlets, or other written materials from your doctor or pharmacy?: 1 - Never  Interpreter Needed?: No  Information entered by :: Vina Ned, CMA   Activities of Daily Living     2024/02/14    1:19 PM   In your present state of health, do you have any difficulty performing the following activities:  Hearing? 0  Vision? 0  Difficulty concentrating  or making decisions? 0  Walking or climbing stairs? 1  Comment cane  Dressing or bathing? 0  Doing errands, shopping? 0  Preparing Food and eating ? N  Using the Toilet? N  In the past six months, have you accidently leaked urine? Y  Comment wears pads  Do you have problems with loss of bowel control? Y  Comment wears pad  Managing your Medications? N  Managing your Finances? N  Housekeeping or managing your Housekeeping? N    Patient Care Team: [name], MD as PCP - General (Internal Medicine) Norleen Na (Optometry) Maree Jannett POUR, MD as Consulting Physician (Neurology) Cathlyn Seal, MD as Referring Physician (Dermatology) Dodson Delon FERNS, MD as Referring Physician (Physical Medicine and Rehabilitation)  I have updated your Care Teams any recent Medical Services you may have received from other providers in the past year.     Assessment:   This is a routine wellness examination for Declan.  Hearing/Vision screen Hearing Screening - Comments:: Denies hearing loss  Vision Screening - Comments:: Needs DM eye exam. Dr. Norleen Na, [facility]. Patient states she will call and schedule.   Goals Addressed             This Visit's Progress    Patient Stated       Be able to sleep better       Depression Screen     02/07/2024    1:30 PM 01/09/2024    2:06 PM 08/11/2023    3:15 PM 03/30/2023   10:16 AM 12/02/2022    1:29 PM 04/11/2022    3:40 PM 03/15/2022   10:47 AM  PHQ 2/9 Scores  PHQ - 2 Score 0 0 0 2 2 2 2   PHQ- 9 Score 7 3  13 5 8 10     Fall Risk     02/07/2024    1:35 PM 01/09/2024    2:06 PM 08/11/2023    3:15 PM 12/02/2022    1:30 PM 04/11/2022    3:42 PM  Fall Risk   Falls in the past year? 1 1 1 1 1   Number falls in past yr: 1 0 0 1 1  Injury with Fall? 1 0 1 1 1   Risk for fall due  to : History of fall(s);Impaired balance/gait;Orthopedic patient;Other (Comment);Impaired mobility History of fall(s) History of fall(s) History of fall(s) History of fall(s)  Risk for fall due to: Comment Parkinson's disease      Follow up Falls evaluation completed;Education provided Falls evaluation completed Falls evaluation completed Falls evaluation completed Falls evaluation completed      Data saved with a previous flowsheet row definition    MEDICARE RISK AT HOME:  Medicare Risk at Home Any stairs in or around the home?: No If so, are there any without handrails?: No Home free of loose throw rugs in walkways, pet beds, electrical cords, etc?: Yes Adequate lighting in your home to reduce risk of falls?: Yes Life alert?: No Use of a cane, walker or w/c?: Yes (cane) Grab bars in the bathroom?: Yes Shower chair or bench in shower?: Yes Elevated toilet seat or a handicapped toilet?: Yes  TIMED UP AND GO:  Was the test performed?  No  Cognitive Function: 6CIT completed        02/07/2024    1:36 PM 10/13/2021    2:59 PM 05/22/2019    1:40 PM 02/21/2018    2:59 PM 02/07/2017   10:35 AM  6CIT Screen  What  Year? 0 points 0 points 0 points 0 points 0 points  What month? 0 points 0 points 0 points 0 points 0 points  What time? 0 points 0 points 0 points 0 points 0 points  Count back from 20 0 points 0 points 0 points 0 points 0 points  Months in reverse 0 points 0 points 0 points 2 points 0 points  Repeat phrase 0 points 2 points 0 points 2 points   Total Score 0 points 2 points 0 points 4 points     Immunizations Immunization History  Administered Date(s) Administered   Fluad Quad(high Dose 65+) 03/04/2019, 04/07/2020, 03/09/2021, 03/15/2022   Fluad Trivalent(High Dose 65+) 03/30/2023   Influenza,inj,Quad PF,6+ Mos 03/10/2015, 04/26/2016, 04/25/2017, 04/20/2018   Influenza-Unspecified 04/12/2017   Moderna Covid Bivalent Peds Booster(40mo Thru 67yrs) 07/08/2021   Moderna  Covid-19 Fall Seasonal Vaccine 49yrs & older 03/24/2023   Moderna Sars-Covid-2 Vaccination 08/14/2019, 09/11/2019, 06/03/2020   Pneumococcal Conjugate-13 01/28/2016   Pneumococcal Polysaccharide-23 05/12/1999, 07/05/2008, 11/19/2018   Respiratory Syncytial Virus Vaccine,Recomb Aduvanted(Arexvy) 06/09/2022   Tdap 10/06/2021   Zoster Recombinant(Shingrix) 07/08/2021, 10/06/2021   Zoster, Live 07/05/2012    Screening Tests Health Maintenance  Topic Date Due   MAMMOGRAM  04/14/2022   OPHTHALMOLOGY EXAM  09/06/2023   COVID-19 Vaccine (6 - Moderna risk 2024-25 season) 09/21/2023   INFLUENZA VACCINE  02/02/2024   HEMOGLOBIN A1C  06/26/2024   Diabetic kidney evaluation - Urine ACR  08/10/2024   FOOT EXAM  08/10/2024   Diabetic kidney evaluation - eGFR measurement  12/28/2024   Medicare Annual Wellness (AWV)  02/06/2025   DEXA SCAN  10/27/2027   Colonoscopy  02/04/2028   DTaP/Tdap/Td (2 - Td or Tdap) 10/07/2031   Pneumococcal Vaccine: 50+ Years  Completed   Zoster Vaccines- Shingrix  Completed   Hepatitis C Screening  Addressed   Hepatitis B Vaccines  Aged Out   HPV VACCINES  Aged Out   Meningococcal B Vaccine  Aged Out    Health Maintenance  Health Maintenance Due  Topic Date Due   MAMMOGRAM  04/14/2022   OPHTHALMOLOGY EXAM  09/06/2023   COVID-19 Vaccine (6 - Moderna risk 2024-25 season) 09/21/2023   INFLUENZA VACCINE  02/02/2024   Health Maintenance Items Addressed: See Nurse Notes at the end of this note  Additional Screening:  Vision Screening: Recommended annual ophthalmology exams for early detection of glaucoma and other disorders of the eye. Would you like a referral to an eye doctor? No    Dental Screening: Recommended annual dental exams for proper oral hygiene  Community Resource Referral / Chronic Care Management: CRR required this visit?  No   CCM required this visit?  No   Plan:    I have personally reviewed and noted the following in the patient's  chart:   Medical and social history Use of alcohol, tobacco or illicit drugs  Current medications and supplements including opioid prescriptions. Patient is currently taking opioid prescriptions. Information provided to patient regarding non-opioid alternatives. Patient advised to discuss non-opioid treatment plan with their provider. Functional ability and status Nutritional status Physical activity Advanced directives List of other physicians Hospitalizations, surgeries, and ER visits in previous 12 months Vitals Screenings to include cognitive, depression, and falls Referrals and appointments  In addition, I have reviewed and discussed with patient certain preventive protocols, quality metrics, and best practice recommendations. A written personalized care plan for preventive services as well as general preventive health recommendations were provided to patient.   Vina  Debby, [facility]   02/07/2024   After Visit Summary: (MyChart) Due to this being a telephonic visit, the after visit summary with patients personalized plan was offered to patient via MyChart   Notes:  Needs DM eye exam. Patient to call and schedule. Flu vaccine in the fall Declined DM & Nutrition education referral Declined Covid vaccine at this time Gave ph# to schedule [facility] (ordered 08/11/23) Made appt for 02/09/24 per patient's request for bilateral leg pain with Dr. Lemon

## 2024-02-09 ENCOUNTER — Ambulatory Visit
Admission: RE | Admit: 2024-02-09 | Discharge: 2024-02-09 | Disposition: A | Discharge status: Home / Self Care | Source: Ambulatory Visit | Attending: Student | Admitting: Student

## 2024-02-09 ENCOUNTER — Ambulatory Visit (INDEPENDENT_AMBULATORY_CARE_PROVIDER_SITE_OTHER): Admitting: Student

## 2024-02-09 ENCOUNTER — Ambulatory Visit

## 2024-02-09 ENCOUNTER — Ambulatory Visit
Admission: RE | Admit: 2024-02-09 | Discharge: 2024-02-09 | Disposition: A | Discharge status: Home / Self Care | Attending: Student | Admitting: Student

## 2024-02-09 VITALS — BP 116/74 | HR 72 | Ht 66.0 in | Wt 183.2 lb

## 2024-02-09 DIAGNOSIS — M79672 Pain in left foot: Secondary | ICD-10-CM

## 2024-02-09 DIAGNOSIS — M7989 Other specified soft tissue disorders: Secondary | ICD-10-CM | POA: Diagnosis not present

## 2024-02-09 NOTE — Progress Notes (Signed)
 Established Patient Office Visit  Subjective   Patient ID: Kiara Campbell, female    DOB: 1956/04/10  Age: 68 y.o. MRN: [id number]  Chief Complaint  Patient presents with   Leg Pain    Patient is here today to discuss her leg/foot pain, recently seen by Dr. Justus for it    Kiara Campbell presents for lateral left foot starting 3 days ago with associated swelling in the foot. Pain is throbbing and keeping her from sleeping. Denies recent trauma. Pain feels similar when she gout in the past. Foot does not feel hot or red. Feels her toes are tingling.  No falls, uses a cane to ambulate. Was treated fro LLE cellulitis due to snake bite in June. Currently on Xarelto for RLE DVT. Recently taking on Celebrex  due to anticoagulation.   Patient Active Problem List   Diagnosis Date Noted   Parkinson's disease (HCC) 08/11/2023   Shortness of breath 03/30/2023   Stage 3a chronic kidney disease (HCC) 09/05/2022   Primary osteoarthritis of left knee 03/22/2022   Displaced fracture of fifth metatarsal bone, left foot, initial encounter for closed fracture 08/11/2021   Anorectal ulcer 07/23/2021   Esophageal dysphagia    Schatzki's ring    Gastric erythema    Hiatal hernia    Hx of skin cancer, basal cell 11/19/2018   Tobacco use disorder, moderate, in sustained remission 08/09/2017   Obesity (BMI 30-39.9) 07/17/2017   GERD (gastroesophageal reflux disease) 07/12/2017   Type II diabetes mellitus with complication (HCC) 06/02/2015   Fibrocystic breast disease 12/03/2014   Colon polyp 12/03/2014   Essential (primary) hypertension 12/03/2014   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   OP (osteoporosis) 12/03/2014   Hyperlipidemia associated with type 2 diabetes mellitus (HCC) 12/03/2014      ROS Refer to HPI    Objective:     BP 116/74   Pulse 72   Ht 5' 6 (1.676 m)   Wt 183 lb 4 oz (83.1 kg)   LMP 09/27/1995   SpO2 99%   BMI 29.58 kg/m  BP Readings from Last 3  Encounters:  02/09/24 116/74  01/09/24 118/74  08/11/23 126/74    Physical Exam Constitutional:      Appearance: Normal appearance.  HENT:     Mouth/Throat:     Mouth: Mucous membranes are moist.     Pharynx: Oropharynx is clear.  Cardiovascular:     Rate and Rhythm: Normal rate and regular rhythm.  Pulmonary:     Effort: Pulmonary effort is normal.     Breath sounds: No rhonchi or rales.  Abdominal:     General: Abdomen is flat. Bowel sounds are normal. There is no distension.     Palpations: Abdomen is soft.     Tenderness: There is no abdominal tenderness.  Musculoskeletal:        General: Normal range of motion.     Right lower leg: No edema.     Left lower leg: No edema.  Skin:    Capillary Refill: Capillary refill takes less than 2 seconds.     Comments: TTP of the fifth left metatarsal. Edema of the left forefoot. No significant redness or warmth of the foot. Palpable DP pulses.   Neurological:     General: No focal deficit present.     Mental Status: She is alert and oriented to person, place, and time.  Psychiatric:        Mood and Affect: Mood normal.  Behavior: Behavior normal.        02/07/2024    1:30 PM 01/09/2024    2:06 PM 08/11/2023    3:15 PM  Depression screen PHQ 2/9  Decreased Interest 0 0 0  Down, Depressed, Hopeless 0 0 0  PHQ - 2 Score 0 0 0  Altered sleeping 3 1   Tired, decreased energy 3 1   Change in appetite 1 1   Feeling bad or failure about yourself  0 0   Trouble concentrating 0 0   Moving slowly or fidgety/restless 0 0   Suicidal thoughts 0 0   PHQ-9 Score 7 3   Difficult doing work/chores Very difficult Not difficult at all        01/09/2024    2:06 PM 08/11/2023    3:15 PM 03/30/2023   10:17 AM 12/02/2022    1:30 PM  GAD 7 : Generalized Anxiety Score  Nervous, Anxious, on Edge 0 0 1 1  Control/stop worrying 0 0 1 0  Worry too much - different things 0 0 1 0  Trouble relaxing 0 0 1 1  Restless 0 0 1 0  Easily annoyed or  irritable 0 0 1 0  Afraid - awful might happen 0 0 1 1  Total GAD 7 Score 0 0 7 3  Anxiety Difficulty Not difficult at all Not difficult at all Somewhat difficult Not difficult at all    No results found for any visits on 02/09/24.  Last CBC Lab Results  Component Value Date   WBC 10.5 12/31/2023   HGB 10.3 (A) 12/31/2023   HCT 31 (A) 12/31/2023   MCV 97.9 03/15/2022   MCH 31.8 03/15/2022   RDW 20.6 (H) 03/15/2022   PLT 256 12/31/2023   Last metabolic panel Lab Results  Component Value Date   GLUCOSE 115 (H) 08/11/2023   NA 142 12/29/2023   K 4.5 12/29/2023   CL 103 12/29/2023   CO2 30 (A) 12/29/2023   BUN 17 12/29/2023   CREATININE 1.0 12/29/2023   EGFR 63 12/29/2023   CALCIUM 9.7 12/29/2023   PROT 7.4 03/30/2023   ALBUMIN 4.6 03/30/2023   LABGLOB 2.8 03/30/2023   AGRATIO 1.9 07/23/2021   BILITOT 0.5 03/30/2023   ALKPHOS 83 12/29/2023   AST 20 12/29/2023   ALT 14 12/29/2023   ANIONGAP 9 03/15/2022      The 10-year ASCVD risk score (Arnett DK, et al., 2019) is: 17.7%    Assessment & Plan:  Left foot pain Non traumatic left lateral foot pain. Is able to ambulate. Does does have history of distal fifth metatarsal fracture. Symptoms do not seem c/w gout. Uric acid is well controlled on 6/26 at 4.8. -     DG Foot Complete Left; Future   No follow-ups on file.    Harlene Saddler, MD

## 2024-02-12 ENCOUNTER — Other Ambulatory Visit: Payer: Self-pay | Admitting: Internal Medicine

## 2024-02-12 ENCOUNTER — Other Ambulatory Visit: Payer: Self-pay | Admitting: Student

## 2024-02-12 ENCOUNTER — Ambulatory Visit: Payer: Self-pay | Admitting: Student

## 2024-02-12 DIAGNOSIS — E1169 Type 2 diabetes mellitus with other specified complication: Secondary | ICD-10-CM

## 2024-02-12 MED ORDER — COLCHICINE 0.6 MG PO TABS: 0.6000 mg | ORAL_TABLET | Freq: Two times a day (BID) | ORAL | 0 refills | Status: AC

## 2024-02-15 NOTE — Telephone Encounter (Signed)
 Requested Prescriptions  Pending Prescriptions Disp Refills   simvastatin (ZOCOR) 40 MG tablet [Pharmacy Med Name: SIMVASTATIN 40 MG Oral Tablet] 90 tablet 3    Sig: TAKE 1 TABLET AT BEDTIME     Cardiovascular:  Antilipid - Statins Failed - 02/15/2024  8:55 AM      Failed - Lipid Panel in normal range within the last 12 months    Cholesterol, Total  Date Value Ref Range Status  03/30/2023 136 100 - 199 mg/dL Final   LDL Chol Calc (NIH)  Date Value Ref Range Status  03/30/2023 69 0 - 99 mg/dL Final   HDL  Date Value Ref Range Status  03/30/2023 48 >39 mg/dL Final   Triglycerides  Date Value Ref Range Status  03/30/2023 106 0 - 149 mg/dL Final         Passed - Patient is not pregnant      Passed - Valid encounter within last 12 months    Recent Outpatient Visits           6 days ago Left foot pain   [place] [facility], MD   1 month ago Cellulitis of left lower extremity   [place] [facility], [name], MD   6 months ago Essential (primary) hypertension   [place] [facility] at [facility], [name], MD       Future Appointments             In 2 months Justus, [name], MD [facility] at [facility], [facility]

## 2024-02-27 DIAGNOSIS — G20C Parkinsonism, unspecified: Secondary | ICD-10-CM | POA: Diagnosis not present

## 2024-02-27 DIAGNOSIS — Z1331 Encounter for screening for depression: Secondary | ICD-10-CM | POA: Diagnosis not present

## 2024-02-28 DIAGNOSIS — Z0100 Encounter for examination of eyes and vision without abnormal findings: Secondary | ICD-10-CM | POA: Diagnosis not present

## 2024-03-14 DIAGNOSIS — H524 Presbyopia: Secondary | ICD-10-CM | POA: Diagnosis not present

## 2024-03-16 ENCOUNTER — Other Ambulatory Visit: Payer: Self-pay | Admitting: Internal Medicine

## 2024-03-16 DIAGNOSIS — M10071 Idiopathic gout, right ankle and foot: Secondary | ICD-10-CM

## 2024-03-18 NOTE — Telephone Encounter (Signed)
 Rx 05/31/23 #90 [id number] Requested Prescriptions  Pending Prescriptions Disp Refills   allopurinol  (ZYLOPRIM ) 300 MG tablet [Pharmacy Med Name: ALLOPURINOL  300 MG Oral Tablet] 90 tablet 3    Sig: TAKE 1 TABLET EVERY DAY     Endocrinology:  Gout Agents - allopurinol  Failed - 03/18/2024  1:47 PM      Failed - Uric Acid in normal range and within 360 days    Uric Acid  Date Value Ref Range Status  07/23/2021 5.6 3.0 - 7.2 mg/dL Final    Comment:               Therapeutic target for gout patients: <6.0         Failed - CBC within normal limits and completed in the last 12 months    WBC  Date Value Ref Range Status  12/31/2023 10.5  Final  03/15/2022 8.3 4.0 - 10.5 K/uL Final   RBC  Date Value Ref Range Status  03/15/2022 3.27 (L) 3.87 - 5.11 MIL/uL Final   Hemoglobin  Date Value Ref Range Status  12/31/2023 10.3 (A) 12.0 - 16.0 Final  07/23/2021 9.7 (L) 11.1 - 15.9 g/dL Final   HCT  Date Value Ref Range Status  12/31/2023 31 (A) 36 - 46 Final   Hematocrit  Date Value Ref Range Status  07/23/2021 28.6 (L) 34.0 - 46.6 % Final   MCHC  Date Value Ref Range Status  03/15/2022 32.5 30.0 - 36.0 g/dL Final   [facility]  Date Value Ref Range Status  03/15/2022 31.8 26.0 - 34.0 pg Final   MCV  Date Value Ref Range Status  03/15/2022 97.9 80.0 - 100.0 fL Final  07/23/2021 96 79 - 97 fL Final   No results found for: PLTCOUNTKUC, LABPLAT, POCPLA RDW  Date Value Ref Range Status  03/15/2022 20.6 (H) 11.5 - 15.5 % Final  07/23/2021 20.2 (H) 11.7 - 15.4 % Final         Passed - Cr in normal range and within 360 days    Creatinine  Date Value Ref Range Status  12/29/2023 1.0 0.5 - 1.1 Final   Creatinine, Ser  Date Value Ref Range Status  08/11/2023 0.98 0.57 - 1.00 mg/dL Final         Passed - Valid encounter within last 12 months    Recent Outpatient Visits           1 month ago Left foot pain   [place] [facility] at [facility], MD   2 months ago Cellulitis of left lower extremity   [place] [facility] at [facility], [name], MD   7 months ago Essential (primary) hypertension   [facility] at [place], [name], MD       Future Appointments             In 1 month [name], MD [facility] at [facility], [phone] [place]

## 2024-03-19 ENCOUNTER — Telehealth: Payer: Self-pay

## 2024-03-19 NOTE — Telephone Encounter (Signed)
 Called pt went over Dr. Arvin message. She verbalized understanding.  KP

## 2024-03-19 NOTE — Telephone Encounter (Signed)
 Copied from CRM [id number]. Topic: Clinical - Medication Question >> Mar 19, 2024  2:44 PM Tobias L wrote: Reason for CRM: Patient inquiring if she should stop the blood thinner medication (rivaroxaban (XARELTO) 20 MG TABS tablet) all together of if Dr. Justus would want patient to get a follow up x ray on her foot to see if the blood clot is gone.   Patient states she is on the last bottle and has about 3 weeks left on it, patient would like to confirm if she would need more imaging or not.   Please advise  Requesting callback: [phone]

## 2024-03-25 ENCOUNTER — Ambulatory Visit

## 2024-03-27 ENCOUNTER — Encounter (INDEPENDENT_AMBULATORY_CARE_PROVIDER_SITE_OTHER): Payer: Self-pay

## 2024-04-01 ENCOUNTER — Other Ambulatory Visit: Payer: Self-pay | Admitting: Internal Medicine

## 2024-04-01 ENCOUNTER — Ambulatory Visit (INDEPENDENT_AMBULATORY_CARE_PROVIDER_SITE_OTHER)

## 2024-04-01 ENCOUNTER — Ambulatory Visit

## 2024-04-01 ENCOUNTER — Ambulatory Visit: Admitting: Internal Medicine

## 2024-04-01 DIAGNOSIS — Z23 Encounter for immunization: Secondary | ICD-10-CM | POA: Diagnosis not present

## 2024-04-01 DIAGNOSIS — H029 Unspecified disorder of eyelid: Secondary | ICD-10-CM

## 2024-04-01 NOTE — Progress Notes (Signed)
 Patient is in office today for a nurse visit for Immunization. Patient Injection was given in the  Left deltoid. Patient tolerated injection well.

## 2024-04-01 NOTE — Progress Notes (Unsigned)
 ophthal   Date:  04/01/2024   Name:  Kiara Campbell   DOB:  08-03-1955   MRN:  [id number]   Chief Complaint: No chief complaint on file.  HPI  Review of Systems   Lab Results  Component Value Date   NA 142 12/29/2023   K 4.5 12/29/2023   CO2 30 (A) 12/29/2023   GLUCOSE 115 (H) 08/11/2023   BUN 17 12/29/2023   CREATININE 1.0 12/29/2023   CALCIUM 9.7 12/29/2023   EGFR 63 12/29/2023   GFRNONAA 52 (L) 03/15/2022   Lab Results  Component Value Date   CHOL 136 03/30/2023   HDL 48 03/30/2023   LDLCALC 69 03/30/2023   TRIG 106 03/30/2023   CHOLHDL 2.8 03/30/2023   Lab Results  Component Value Date   TSH 1.797 03/15/2022   Lab Results  Component Value Date   HGBA1C 6.3 12/26/2023   Lab Results  Component Value Date   WBC 10.5 12/31/2023   HGB 10.3 (A) 12/31/2023   HCT 31 (A) 12/31/2023   MCV 97.9 03/15/2022   PLT 256 12/31/2023   Lab Results  Component Value Date   ALT 14 12/29/2023   AST 20 12/29/2023   ALKPHOS 83 12/29/2023   BILITOT 0.5 03/30/2023   Lab Results  Component Value Date   VD25OH 48.8 09/05/2022     Patient Active Problem List   Diagnosis Date Noted   Parkinson's disease (HCC) 08/11/2023   Shortness of breath 03/30/2023   Stage 3a chronic kidney disease (HCC) 09/05/2022   Primary osteoarthritis of left knee 03/22/2022   Displaced fracture of fifth metatarsal bone, left foot, initial encounter for closed fracture 08/11/2021   Anorectal ulcer 07/23/2021   Esophageal dysphagia    Schatzki's ring    Gastric erythema    Hiatal hernia    Hx of skin cancer, basal cell 11/19/2018   Tobacco use disorder, moderate, in sustained remission 08/09/2017   Obesity (BMI 30-39.9) 07/17/2017   GERD (gastroesophageal reflux disease) 07/12/2017   Type II diabetes mellitus with complication (HCC) 06/02/2015   Fibrocystic breast disease 12/03/2014   Colon polyp 12/03/2014   Essential (primary) hypertension 12/03/2014   Lumbar disc herniation with  radiculopathy 12/03/2014   OP (osteoporosis) 12/03/2014   Hyperlipidemia associated with type 2 diabetes mellitus (HCC) 12/03/2014    Allergies  Allergen Reactions   Glipizide Nausea Only    Past Surgical History:  Procedure Laterality Date   ABDOMINAL HYSTERECTOMY     partial   ABDOMINAL HYSTERECTOMY     APPENDECTOMY     BREAST CYST ASPIRATION     neg not sure which side   COLONOSCOPY WITH PROPOFOL  N/A 02/03/2021   Procedure: COLONOSCOPY WITH PROPOFOL ;  Surgeon: Janalyn Keene NOVAK, MD;  Location: [facility] ENDOSCOPY;  Service: Endoscopy;  Laterality: N/A;   ESOPHAGOGASTRODUODENOSCOPY N/A 02/03/2021   Procedure: ESOPHAGOGASTRODUODENOSCOPY (EGD);  Surgeon: Janalyn Keene NOVAK, MD;  Location: [facility] ENDOSCOPY;  Service: Endoscopy;  Laterality: N/A;   JOINT REPLACEMENT     LESION DESTRUCTION N/A 08/18/2021   Procedure: BIOPSY OF ANORECTAL ULCER;  Surgeon: Teresa Lonni HERO, MD;  Location: [facility];  Service: General;  Laterality: N/A;   RECTAL PROLAPSE REPAIR     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   TONSILLECTOMY     VARICOSE VEIN SURGERY      Social History   Tobacco Use   Smoking status: Former    Current packs/day: 0.00    Average packs/day: 0.5 packs/day for 30.0 years (  15.0 ttl pk-yrs)    Types: Cigarettes    Start date: 08/04/1988    Quit date: 08/04/2018    Years since quitting: 5.6   Smokeless tobacco: Never   Tobacco comments:    Patient declines Lung Cancer Screening.  Vaping Use   Vaping status: Never Used  Substance Use Topics   Alcohol use: Not Currently    Comment: rarely   Drug use: Never     Medication list has been reviewed and updated.  No outpatient medications have been marked as taking for the 04/01/24 encounter (Orders Only) with [name], MD.       01/09/2024    2:06 PM 08/11/2023    3:15 PM 03/30/2023   10:17 AM 12/02/2022    1:30 PM  GAD 7 : Generalized Anxiety Score  Nervous, Anxious, on Edge 0 0 1 1  Control/stop  worrying 0 0 1 0  Worry too much - different things 0 0 1 0  Trouble relaxing 0 0 1 1  Restless 0 0 1 0  Easily annoyed or irritable 0 0 1 0  Afraid - awful might happen 0 0 1 1  Total GAD 7 Score 0 0 7 3  Anxiety Difficulty Not difficult at all Not difficult at all Somewhat difficult Not difficult at all       02/07/2024    1:30 PM 01/09/2024    2:06 PM 08/11/2023    3:15 PM  Depression screen PHQ 2/9  Decreased Interest 0 0 0  Down, Depressed, Hopeless 0 0 0  PHQ - 2 Score 0 0 0  Altered sleeping 3 1   Tired, decreased energy 3 1   Change in appetite 1 1   Feeling bad or failure about yourself  0 0   Trouble concentrating 0 0   Moving slowly or fidgety/restless 0 0   Suicidal thoughts 0 0   PHQ-9 Score 7 3   Difficult doing work/chores Very difficult Not difficult at all     BP Readings from Last 3 Encounters:  02/09/24 116/74  01/09/24 118/74  08/11/23 126/74    Physical Exam  Wt Readings from Last 3 Encounters:  02/09/24 183 lb 4 oz (83.1 kg)  02/07/24 175 lb (79.4 kg)  01/09/24 183 lb (83 kg)    LMP 09/27/1995   Assessment and Plan:  Problem List Items Addressed This Visit   None   No follow-ups on file.    [name], MD [facility] [place]

## 2024-04-02 DIAGNOSIS — R2689 Other abnormalities of gait and mobility: Secondary | ICD-10-CM | POA: Diagnosis not present

## 2024-04-02 DIAGNOSIS — G20C Parkinsonism, unspecified: Secondary | ICD-10-CM | POA: Diagnosis not present

## 2024-04-02 DIAGNOSIS — M79604 Pain in right leg: Secondary | ICD-10-CM | POA: Diagnosis not present

## 2024-04-02 DIAGNOSIS — G4752 REM sleep behavior disorder: Secondary | ICD-10-CM | POA: Diagnosis not present

## 2024-04-02 DIAGNOSIS — G4701 Insomnia due to medical condition: Secondary | ICD-10-CM | POA: Diagnosis not present

## 2024-04-18 DIAGNOSIS — M1712 Unilateral primary osteoarthritis, left knee: Secondary | ICD-10-CM | POA: Diagnosis not present

## 2024-04-18 DIAGNOSIS — Z96651 Presence of right artificial knee joint: Secondary | ICD-10-CM | POA: Diagnosis not present

## 2024-04-18 DIAGNOSIS — E119 Type 2 diabetes mellitus without complications: Secondary | ICD-10-CM | POA: Diagnosis not present

## 2024-04-23 ENCOUNTER — Ambulatory Visit
Admission: RE | Admit: 2024-04-23 | Discharge: 2024-04-23 | Disposition: A | Discharge status: Home / Self Care | Source: Ambulatory Visit | Attending: Internal Medicine | Admitting: Internal Medicine

## 2024-04-23 DIAGNOSIS — Z1231 Encounter for screening mammogram for malignant neoplasm of breast: Secondary | ICD-10-CM | POA: Diagnosis not present

## 2024-04-28 ENCOUNTER — Other Ambulatory Visit: Payer: Self-pay | Admitting: Internal Medicine

## 2024-04-28 DIAGNOSIS — M170 Bilateral primary osteoarthritis of knee: Secondary | ICD-10-CM

## 2024-04-30 NOTE — Telephone Encounter (Signed)
 Requested Prescriptions  Pending Prescriptions Disp Refills   celecoxib  (CELEBREX ) 200 MG capsule [Pharmacy Med Name: CELECOXIB  200 MG Oral Capsule] 180 capsule 0    Sig: TAKE 1 CAPSULE TWICE DAILY     Analgesics:  COX2 Inhibitors Failed - 04/30/2024 12:27 PM      Failed - Manual Review: Labs are only required if the patient has taken medication for more than 8 weeks.      Failed - HGB in normal range and within 360 days    Hemoglobin  Date Value Ref Range Status  12/31/2023 10.3 (A) 12.0 - 16.0 Final  07/23/2021 9.7 (L) 11.1 - 15.9 g/dL Final         Failed - HCT in normal range and within 360 days    HCT  Date Value Ref Range Status  12/31/2023 31 (A) 36 - 46 Final   Hematocrit  Date Value Ref Range Status  07/23/2021 28.6 (L) 34.0 - 46.6 % Final         Passed - Cr in normal range and within 360 days    Creatinine  Date Value Ref Range Status  12/29/2023 1.0 0.5 - 1.1 Final   Creatinine, Ser  Date Value Ref Range Status  08/11/2023 0.98 0.57 - 1.00 mg/dL Final         Passed - AST in normal range and within 360 days    AST  Date Value Ref Range Status  12/29/2023 20 13 - 35 Final         Passed - ALT in normal range and within 360 days    ALT  Date Value Ref Range Status  12/29/2023 14 7 - 35 U/L Final         Passed - eGFR is 30 or above and within 360 days    GFR calc Af Amer  Date Value Ref Range Status  04/07/2020 60 >59 mL/min/1.73 Final    Comment:    **[facility] currently reports eGFR in compliance with the current**   recommendations of the [employer]. [facility] will   update reporting as new guidelines are published from the NKF-ASN   Task force.    GFR, Estimated  Date Value Ref Range Status  03/15/2022 52 (L) >60 mL/min Final    Comment:    (NOTE) Calculated using the CKD-EPI Creatinine Equation (2021)    eGFR  Date Value Ref Range Status  12/29/2023 63  Final  08/11/2023 62 >59 mL/min/1.73 Final         Passed -  Patient is not pregnant      Passed - Valid encounter within last 12 months    Recent Outpatient Visits           2 months ago Left foot pain   [place] [facility] at [facility], MD   3 months ago Cellulitis of left lower extremity   [place] [facility] at [facility], [name], MD   8 months ago Essential (primary) hypertension   [place] [facility] at [facility], [name], MD       Future Appointments             In 1 week Justus, [name], MD [facility] at [facility], [phone] [place]

## 2024-05-12 ENCOUNTER — Other Ambulatory Visit: Payer: Self-pay | Admitting: Internal Medicine

## 2024-05-12 DIAGNOSIS — K219 Gastro-esophageal reflux disease without esophagitis: Secondary | ICD-10-CM

## 2024-05-13 ENCOUNTER — Ambulatory Visit: Admitting: Internal Medicine

## 2024-05-13 DIAGNOSIS — I10 Essential (primary) hypertension: Secondary | ICD-10-CM | POA: Diagnosis not present

## 2024-05-13 DIAGNOSIS — Z1321 Encounter for screening for nutritional disorder: Secondary | ICD-10-CM | POA: Diagnosis not present

## 2024-05-13 DIAGNOSIS — E119 Type 2 diabetes mellitus without complications: Secondary | ICD-10-CM | POA: Diagnosis not present

## 2024-05-13 DIAGNOSIS — Z1331 Encounter for screening for depression: Secondary | ICD-10-CM | POA: Diagnosis not present

## 2024-05-13 DIAGNOSIS — G20A2 Parkinson's disease without dyskinesia, with fluctuations: Secondary | ICD-10-CM | POA: Diagnosis not present

## 2024-05-13 DIAGNOSIS — D649 Anemia, unspecified: Secondary | ICD-10-CM | POA: Diagnosis not present

## 2024-05-14 NOTE — Telephone Encounter (Signed)
 Requested Prescriptions  Pending Prescriptions Disp Refills   omeprazole  (PRILOSEC) 20 MG capsule [Pharmacy Med Name: OMEPRAZOLE  DR 20 MG Oral Capsule Delayed Release] 90 capsule 3    Sig: TAKE 1 CAPSULE EVERY DAY     Gastroenterology: Proton Pump Inhibitors Passed - 05/14/2024 11:59 AM      Passed - Valid encounter within last 12 months    Recent Outpatient Visits           3 months ago Left foot pain   [place] [facility] at [facility], MD   4 months ago Cellulitis of left lower extremity   [place] [facility] at [facility], [name], MD   9 months ago Essential (primary) hypertension   [facility] at [facility], [name], MD

## 2024-05-20 ENCOUNTER — Telehealth: Payer: Self-pay | Admitting: Pharmacist

## 2024-05-20 NOTE — Progress Notes (Signed)
 Pharmacy Quality Measure Review  This patient is appearing on a report for being at risk of failing the adherence measure for cholesterol (statin) medications this calendar year.   Medication: simvastatin  40 mg  Last fill date: 05/14/2024 for 90 day supply  Insurance report was not up to date. No action needed at this time.   Patient is now established with [facility].  Catie [facility], PharmD, [facility] Clinical Pharmacist [phone]
